# Patient Record
Sex: Female | Born: 1970 | Race: White | Hispanic: No | Smoking: Never smoker
Health system: Southern US, Community
[De-identification: ages and names within clinical notes are randomized; demographics above are authoritative.]

## PROBLEM LIST (undated history)

## (undated) DIAGNOSIS — Z8632 Personal history of gestational diabetes: Secondary | ICD-10-CM

## (undated) DIAGNOSIS — F419 Anxiety disorder, unspecified: Secondary | ICD-10-CM

## (undated) DIAGNOSIS — Z973 Presence of spectacles and contact lenses: Secondary | ICD-10-CM

## (undated) DIAGNOSIS — N870 Mild cervical dysplasia: Secondary | ICD-10-CM

## (undated) DIAGNOSIS — Z8249 Family history of ischemic heart disease and other diseases of the circulatory system: Secondary | ICD-10-CM

## (undated) DIAGNOSIS — F32A Depression, unspecified: Secondary | ICD-10-CM

## (undated) DIAGNOSIS — I10 Essential (primary) hypertension: Secondary | ICD-10-CM

## (undated) DIAGNOSIS — D649 Anemia, unspecified: Secondary | ICD-10-CM

## (undated) DIAGNOSIS — R519 Headache, unspecified: Secondary | ICD-10-CM

## (undated) DIAGNOSIS — F329 Major depressive disorder, single episode, unspecified: Secondary | ICD-10-CM

## (undated) DIAGNOSIS — K219 Gastro-esophageal reflux disease without esophagitis: Secondary | ICD-10-CM

## (undated) DIAGNOSIS — H9313 Tinnitus, bilateral: Secondary | ICD-10-CM

## (undated) HISTORY — DX: Family history of ischemic heart disease and other diseases of the circulatory system: Z82.49

## (undated) HISTORY — DX: Mild cervical dysplasia: N87.0

## (undated) HISTORY — PX: OTHER SURGICAL HISTORY: SHX169

## (undated) HISTORY — DX: Depression, unspecified: F32.A

## (undated) HISTORY — PX: BUNIONECTOMY: SHX129

## (undated) HISTORY — DX: Personal history of gestational diabetes: Z86.32

## (undated) HISTORY — DX: Anxiety disorder, unspecified: F41.9

## (undated) HISTORY — DX: Anemia, unspecified: D64.9

## (undated) HISTORY — DX: Major depressive disorder, single episode, unspecified: F32.9

---
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
## 1995-09-19 HISTORY — PX: BUNIONECTOMY: SHX129

## 1997-09-18 HISTORY — PX: CERVICAL BIOPSY  W/ LOOP ELECTRODE EXCISION: SUR135

## 1998-02-23 ENCOUNTER — Inpatient Hospital Stay (HOSPITAL_COMMUNITY): Admission: AD | Admit: 1998-02-23 | Discharge: 1998-02-23 | Payer: Self-pay | Admitting: Obstetrics and Gynecology

## 1998-02-23 ENCOUNTER — Other Ambulatory Visit: Admission: RE | Admit: 1998-02-23 | Discharge: 1998-02-23 | Payer: Self-pay | Admitting: Obstetrics and Gynecology

## 1998-03-31 ENCOUNTER — Inpatient Hospital Stay (HOSPITAL_COMMUNITY): Admission: AD | Admit: 1998-03-31 | Discharge: 1998-04-03 | Payer: Self-pay | Admitting: Gynecology

## 1998-05-13 ENCOUNTER — Other Ambulatory Visit: Admission: RE | Admit: 1998-05-13 | Discharge: 1998-05-13 | Payer: Self-pay | Admitting: Gynecology

## 1998-06-24 ENCOUNTER — Other Ambulatory Visit: Admission: RE | Admit: 1998-06-24 | Discharge: 1998-06-24 | Payer: Self-pay | Admitting: Obstetrics and Gynecology

## 1998-06-24 ENCOUNTER — Other Ambulatory Visit: Admission: RE | Admit: 1998-06-24 | Discharge: 1998-06-24 | Payer: Self-pay | Admitting: Gynecology

## 1998-10-15 ENCOUNTER — Other Ambulatory Visit: Admission: RE | Admit: 1998-10-15 | Discharge: 1998-10-15 | Payer: Self-pay | Admitting: Obstetrics and Gynecology

## 1999-06-22 ENCOUNTER — Other Ambulatory Visit: Admission: RE | Admit: 1999-06-22 | Discharge: 1999-06-22 | Payer: Self-pay | Admitting: Obstetrics and Gynecology

## 1999-11-21 ENCOUNTER — Other Ambulatory Visit: Admission: RE | Admit: 1999-11-21 | Discharge: 1999-11-21 | Payer: Self-pay | Admitting: Obstetrics and Gynecology

## 2000-02-23 ENCOUNTER — Other Ambulatory Visit: Admission: RE | Admit: 2000-02-23 | Discharge: 2000-02-23 | Payer: Self-pay | Admitting: Obstetrics and Gynecology

## 2000-06-25 ENCOUNTER — Other Ambulatory Visit: Admission: RE | Admit: 2000-06-25 | Discharge: 2000-06-25 | Payer: Self-pay | Admitting: Obstetrics and Gynecology

## 2000-07-13 ENCOUNTER — Encounter (INDEPENDENT_AMBULATORY_CARE_PROVIDER_SITE_OTHER): Payer: Self-pay

## 2000-07-13 ENCOUNTER — Other Ambulatory Visit: Admission: RE | Admit: 2000-07-13 | Discharge: 2000-07-13 | Payer: Self-pay | Admitting: Obstetrics and Gynecology

## 2000-08-03 ENCOUNTER — Encounter (INDEPENDENT_AMBULATORY_CARE_PROVIDER_SITE_OTHER): Payer: Self-pay

## 2000-08-03 ENCOUNTER — Other Ambulatory Visit: Admission: RE | Admit: 2000-08-03 | Discharge: 2000-08-03 | Payer: Self-pay | Admitting: Obstetrics and Gynecology

## 2000-11-14 ENCOUNTER — Other Ambulatory Visit: Admission: RE | Admit: 2000-11-14 | Discharge: 2000-11-14 | Payer: Self-pay | Admitting: Obstetrics and Gynecology

## 2001-02-25 ENCOUNTER — Other Ambulatory Visit: Admission: RE | Admit: 2001-02-25 | Discharge: 2001-02-25 | Payer: Self-pay | Admitting: Obstetrics and Gynecology

## 2001-05-27 ENCOUNTER — Other Ambulatory Visit: Admission: RE | Admit: 2001-05-27 | Discharge: 2001-05-27 | Payer: Self-pay | Admitting: Obstetrics and Gynecology

## 2002-06-25 ENCOUNTER — Other Ambulatory Visit: Admission: RE | Admit: 2002-06-25 | Discharge: 2002-06-25 | Payer: Self-pay | Admitting: Obstetrics and Gynecology

## 2003-07-14 ENCOUNTER — Other Ambulatory Visit: Admission: RE | Admit: 2003-07-14 | Discharge: 2003-07-14 | Payer: Self-pay | Admitting: Obstetrics and Gynecology

## 2003-09-19 HISTORY — PX: AUGMENTATION MAMMAPLASTY: SUR837

## 2004-08-10 ENCOUNTER — Other Ambulatory Visit: Admission: RE | Admit: 2004-08-10 | Discharge: 2004-08-10 | Payer: Self-pay | Admitting: Obstetrics and Gynecology

## 2005-10-06 ENCOUNTER — Other Ambulatory Visit: Admission: RE | Admit: 2005-10-06 | Discharge: 2005-10-06 | Payer: Self-pay | Admitting: Gynecology

## 2006-01-23 ENCOUNTER — Encounter: Admission: RE | Admit: 2006-01-23 | Discharge: 2006-01-23 | Payer: Self-pay | Admitting: Gynecology

## 2006-04-04 ENCOUNTER — Inpatient Hospital Stay (HOSPITAL_COMMUNITY): Admission: AD | Admit: 2006-04-04 | Discharge: 2006-04-05 | Payer: Self-pay | Admitting: Gynecology

## 2006-04-09 ENCOUNTER — Inpatient Hospital Stay (HOSPITAL_COMMUNITY): Admission: AD | Admit: 2006-04-09 | Discharge: 2006-04-12 | Payer: Self-pay | Admitting: Gynecology

## 2006-05-22 ENCOUNTER — Other Ambulatory Visit: Admission: RE | Admit: 2006-05-22 | Discharge: 2006-05-22 | Payer: Self-pay | Admitting: Gynecology

## 2007-07-19 ENCOUNTER — Other Ambulatory Visit: Admission: RE | Admit: 2007-07-19 | Discharge: 2007-07-19 | Payer: Self-pay | Admitting: Gynecology

## 2008-08-10 ENCOUNTER — Encounter: Payer: Self-pay | Admitting: Women's Health

## 2008-08-10 ENCOUNTER — Ambulatory Visit: Payer: Self-pay | Admitting: Women's Health

## 2008-08-10 ENCOUNTER — Other Ambulatory Visit: Admission: RE | Admit: 2008-08-10 | Discharge: 2008-08-10 | Payer: Self-pay | Admitting: Obstetrics and Gynecology

## 2009-08-30 ENCOUNTER — Other Ambulatory Visit: Admission: RE | Admit: 2009-08-30 | Discharge: 2009-08-30 | Payer: Self-pay | Admitting: Gynecology

## 2009-08-30 ENCOUNTER — Ambulatory Visit: Payer: Self-pay | Admitting: Women's Health

## 2010-08-31 ENCOUNTER — Other Ambulatory Visit
Admission: RE | Admit: 2010-08-31 | Discharge: 2010-08-31 | Payer: Self-pay | Source: Home / Self Care | Admitting: Gynecology

## 2010-08-31 ENCOUNTER — Ambulatory Visit: Payer: Self-pay | Admitting: Women's Health

## 2010-10-18 ENCOUNTER — Encounter
Admission: RE | Admit: 2010-10-18 | Discharge: 2010-10-18 | Payer: Self-pay | Source: Home / Self Care | Attending: Gynecology | Admitting: Gynecology

## 2011-02-03 NOTE — H&P (Signed)
NAMEMODESTA, [name]               ACCOUNT NO.:  [id number]   MEDICAL RECORD NO.:  [id number]          PATIENT TYPE:  INP   LOCATION:  9170                          FACILITY:  [facility]   PHYSICIAN:  Juan H. Lily Peer, M.D.DATE OF BIRTH:  12/22/1970   DATE OF ADMISSION:  04/04/2006  DATE OF DISCHARGE:  04/05/2006                                HISTORY & PHYSICAL   The patient is a 40 year old gravida 3, para 1, AB 1, who was seen this  morning in the office complaining of lower abdominal discomfort after a  pelvic examination determined that she was 3 cm, 80% effaced, 0 station.  She returned back in the afternoon complaining of passing of a blood clot  and bleeding.  An ultrasound had demonstrated that the placenta was intact,  AFI was normal, no previa was seen with color flow Doppler and the fetus was  reported to be in the vertex presentation.   PRENATAL HISTORY:  Significant for the fact that the patient was diagnosed  with gestational diabetes and required insulin.  Her last insulin regimen  has consisted of 8 units of NPH at bedtime and she had been followed with  twice a week NSTs and once a week AFI with reassuring antepartum testing and  her group B strep had been negative.  Due her advanced maternal age, she had  been offered genetic amniocentesis and did not do so based on the first  trimester screen findings making her risk low, nevertheless it had been  offered to the patient.  A cystic fibrosis screen had been done and it was  reported to be negative for the 32 mutations tested.   PAST MEDICAL HISTORY:  1.  The patient had a normal spontaneous vaginal delivery at 37 weeks in      1999.  2.  In 2006, she had a missed AB.  3.  SHE DENIES ANY ALLERGIES.  4.  She has had history of dysplasia and a CNB in 1999.   REVIEW OF SYSTEMS:  See [place] form.   PHYSICAL EXAMINATION:  A well-developed, well-nourished female with the  above-mentioned complaint.  Blood pressure was  112/70.  Urine was negative  for protein and glucose.  HEENT:  Unremarkable.  NECK:  Supple.  Trachea midline.  No carotid bruits, no thyromegaly.  LUNGS:  Clear to auscultation without rhonchi or wheezes.  HEART:  Regular rate and rhythm, no murmurs or gallop.  BREAST EXAM:  Not done.  ABDOMEN:  Gravid uterus.  Vertex presentation  by [facility] maneuver.  PELVIC EXAM:  Cervix 3 cm, 80% effaced, 0 station.  EXTREMITIES:  DTRs 1-2+, negative clonus, trace edema.   PRENATAL LABS:  A positive blood type, negative antibody screen, VDRL was  nonreactive, Rubella immune.  Hepatitis B Surface Antigen and HIV were  negative. Alpha fetoprotein was normal.  Cystic fibrosis screening was  normal.  GBS culture was negative, abnormal diabetes screen.   ASSESSMENT:  A 40 year old gravida 3, para 1, AB 1 at [redacted] weeks gestation,  gestational diabetic on insulin consisting of eight units of NPH every hour  of sleep with early labor.  She had been placed on a monitor, has been  contracting every 6-10 minutes apart but very irregular and mild, reassuring  tracing.  She had passed a blood clot after an examination earlier in the  morning.  Cervix was 3 and 80% and 0 station.  Ultrasound with no placental  abnormality.  Fetus in the vertex presentation.  Patient will be [facility] to  [facility], anticipate vaginal delivery.   PLAN:  As per assessment above.      Juan H. Lily Peer, M.D.  Electronically Signed     JHF/MEDQ  D:  04/09/2006  T:  04/09/2006  Job:  [id number]

## 2011-10-06 ENCOUNTER — Other Ambulatory Visit: Payer: Self-pay | Admitting: Women's Health

## 2011-11-06 ENCOUNTER — Other Ambulatory Visit: Payer: Self-pay | Admitting: Women's Health

## 2011-11-06 NOTE — Telephone Encounter (Signed)
PATIENT HAS CE SCHEDULED 11/16/11. OVERDUE FOR CE.

## 2011-11-13 DIAGNOSIS — Z8632 Personal history of gestational diabetes: Secondary | ICD-10-CM | POA: Insufficient documentation

## 2011-11-13 DIAGNOSIS — N870 Mild cervical dysplasia: Secondary | ICD-10-CM | POA: Insufficient documentation

## 2011-11-16 ENCOUNTER — Encounter: Payer: Self-pay | Admitting: Women's Health

## 2011-11-16 ENCOUNTER — Other Ambulatory Visit (HOSPITAL_COMMUNITY)
Admission: RE | Admit: 2011-11-16 | Discharge: 2011-11-16 | Disposition: A | Discharge status: Home / Self Care | Payer: 59 | Source: Ambulatory Visit | Attending: Obstetrics and Gynecology | Admitting: Obstetrics and Gynecology

## 2011-11-16 ENCOUNTER — Ambulatory Visit (INDEPENDENT_AMBULATORY_CARE_PROVIDER_SITE_OTHER): Payer: PRIVATE HEALTH INSURANCE | Admitting: Women's Health

## 2011-11-16 VITALS — BP 114/74 | Ht 63.25 in | Wt 131.0 lb

## 2011-11-16 DIAGNOSIS — Z833 Family history of diabetes mellitus: Secondary | ICD-10-CM

## 2011-11-16 DIAGNOSIS — F411 Generalized anxiety disorder: Secondary | ICD-10-CM

## 2011-11-16 DIAGNOSIS — IMO0001 Reserved for inherently not codable concepts without codable children: Secondary | ICD-10-CM

## 2011-11-16 DIAGNOSIS — Z01419 Encounter for gynecological examination (general) (routine) without abnormal findings: Secondary | ICD-10-CM | POA: Insufficient documentation

## 2011-11-16 DIAGNOSIS — F419 Anxiety disorder, unspecified: Secondary | ICD-10-CM

## 2011-11-16 DIAGNOSIS — Z309 Encounter for contraceptive management, unspecified: Secondary | ICD-10-CM

## 2011-11-16 LAB — GLUCOSE, RANDOM: Glucose, Bld: 79 mg/dL (ref 70–99)

## 2011-11-16 LAB — CBC WITH DIFFERENTIAL/PLATELET
Eosinophils Absolute: 0.4 10*3/uL (ref 0.0–0.7)
Hemoglobin: 11.8 g/dL — ABNORMAL LOW (ref 12.0–15.0)
Lymphocytes Relative: 37 % (ref 12–46)
Lymphs Abs: 1.9 10*3/uL (ref 0.7–4.0)
MCH: 29.5 pg (ref 26.0–34.0)
Monocytes Relative: 10 % (ref 3–12)
Neutrophils Relative %: 44 % (ref 43–77)
RBC: 4 MIL/uL (ref 3.87–5.11)

## 2011-11-16 MED ORDER — ESCITALOPRAM OXALATE 20 MG PO TABS: 20.0000 mg | ORAL_TABLET | Freq: Every day | ORAL | Status: DC

## 2011-11-16 MED ORDER — NORGESTIM-ETH ESTRAD TRIPHASIC 0.18/0.215/0.25 MG-35 MCG PO TABS: 1.0000 | ORAL_TABLET | Freq: Every day | ORAL | Status: DC

## 2011-11-16 NOTE — Progress Notes (Signed)
[name] Feb 18, 1971 [id number]    History:    The patient presents for annual exam.  Monthly 6-7 day cycle on Ortho Tri-Cyclen without complaint. 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 Normal mammogram. Anxiety/depression stable on Lexapro.   Past medical history, past surgical history, family history and social history were all reviewed and documented in the EPIC chart. [name] 11, [name] 3, both doing well. History of GDM   ROS:  A  ROS was performed and pertinent positives and negatives are included in the history.  Exam:  Filed Vitals:   11/16/11 0901  BP: 114/74    General appearance:  Normal Head/Neck:  Normal, without cervical or supraclavicular adenopathy. Thyroid:  Symmetrical, normal in size, without palpable masses or nodularity. Respiratory  Effort:  Normal  Auscultation:  Clear without wheezing or rhonchi Cardiovascular  Auscultation:  Regular rate, without rubs, murmurs or gallops  Edema/varicosities:  Not grossly evident Abdominal  Soft,nontender, without masses, guarding or rebound.  Liver/spleen:  No organomegaly noted  Hernia:  None appreciated  Skin  Inspection:  Grossly normal  Palpation:  Grossly normal Neurologic/psychiatric  Orientation:  Normal with appropriate conversation.  Mood/affect:  Normal  Genitourinary    Breasts: Examined lying and sitting/augmented    Right: Without masses, retractions, discharge or axillary adenopathy.     Left: Without masses, retractions, discharge or axillary adenopathy.   Inguinal/mons:  Normal without inguinal adenopathy  External genitalia:  Normal  BUS/Urethra/Skene's glands:  Normal  Bladder:  Normal  Vagina:  Normal  Cervix:  Normal  Uterus:   normal in size, shape and contour.  Midline and mobile  Adnexa/parametria:     Rt: Without masses or tenderness.   Lt: Without masses or tenderness.  Anus and perineum: Normal  Digital rectal exam: Normal sphincter tone without palpated  masses or tenderness  Assessment/Plan:  41 y.o. MWF G3 P2 for annual exam without complaint.    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 Nonsmoker. SBE's, annual mammogram, which is due, exercise, calcium rich diet, vitamin D 1000 daily encouraged. Lexapro 20 mg by mouth daily, prescription, proper use, given and reviewed declines need for counseling at this time. CBC, glucose, UA and Pap.    Harrington Challenger [facility]NP, 12:12 PM 11/16/2011

## 2011-11-16 NOTE — Patient Instructions (Signed)

## 2011-11-17 LAB — URINALYSIS W MICROSCOPIC + REFLEX CULTURE
Casts: NONE SEEN
Crystals: NONE SEEN
Leukocytes, UA: NEGATIVE
Nitrite: NEGATIVE
Specific Gravity, Urine: 1.02 (ref 1.005–1.030)
pH: 7 (ref 5.0–8.0)

## 2011-11-18 LAB — URINE CULTURE

## 2011-12-06 ENCOUNTER — Other Ambulatory Visit: Payer: Self-pay | Admitting: Women's Health

## 2012-06-27 DIAGNOSIS — F419 Anxiety disorder, unspecified: Secondary | ICD-10-CM | POA: Insufficient documentation

## 2012-11-21 ENCOUNTER — Encounter: Payer: Self-pay | Admitting: Women's Health

## 2012-11-21 ENCOUNTER — Ambulatory Visit (INDEPENDENT_AMBULATORY_CARE_PROVIDER_SITE_OTHER): Payer: PRIVATE HEALTH INSURANCE | Admitting: Women's Health

## 2012-11-21 VITALS — BP 116/70 | Ht 63.0 in | Wt 137.0 lb

## 2012-11-21 DIAGNOSIS — Z01419 Encounter for gynecological examination (general) (routine) without abnormal findings: Secondary | ICD-10-CM

## 2012-11-21 DIAGNOSIS — IMO0001 Reserved for inherently not codable concepts without codable children: Secondary | ICD-10-CM

## 2012-11-21 DIAGNOSIS — N870 Mild cervical dysplasia: Secondary | ICD-10-CM

## 2012-11-21 DIAGNOSIS — Z309 Encounter for contraceptive management, unspecified: Secondary | ICD-10-CM

## 2012-11-21 MED ORDER — NORGESTIM-ETH ESTRAD TRIPHASIC 0.18/0.215/0.25 MG-35 MCG PO TABS: 1.0000 | ORAL_TABLET | Freq: Every day | ORAL | Status: DC

## 2012-11-21 NOTE — Patient Instructions (Addendum)

## 2012-11-21 NOTE — Progress Notes (Signed)
[name] [birth year] [id number]    History:    The patient presents for annual exam.  Regular monthly cycle on Ortho Tri-Cyclen without complaint. History of CIN-1 with a LEEP in 1999 with normal Paps following. History of anxiety and depression on Lexapro in the past now on Zoloft per primary care doing well. Normal mammograms, overdue.   Past medical history, past surgical history, family history and social history were all reviewed and documented in the EPIC chart. 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 History of GDM.   ROS:  A  ROS was performed and pertinent positives and negatives are included in the history.  Exam:  Filed Vitals:   11/21/12 0938  BP: 116/70    General appearance:  Normal Head/Neck:  Normal, without cervical or supraclavicular adenopathy. Thyroid:  Symmetrical, normal in size, without palpable masses or nodularity. Respiratory  Effort:  Normal  Auscultation:  Clear without wheezing or rhonchi Cardiovascular  Auscultation:  Regular rate, without rubs, murmurs or gallops  Edema/varicosities:  Not grossly evident Abdominal  Soft,nontender, without masses, guarding or rebound.  Liver/spleen:  No organomegaly noted  Hernia:  None appreciated  Skin  Inspection:  Grossly normal  Palpation:  Grossly normal Neurologic/psychiatric  Orientation:  Normal with appropriate conversation.  Mood/affect:  Normal  Genitourinary    Breasts: Examined lying and sitting/implants.     Right: Without masses, retractions, discharge or axillary adenopathy.     Left: Without masses, retractions, discharge or axillary adenopathy.   Inguinal/mons:  Normal without inguinal adenopathy  External genitalia:  Normal  BUS/Urethra/Skene's glands:  Normal  Bladder:  Normal  Vagina:  Normal  Cervix:  Normal  Uterus:   normal in size, shape and contour.  Midline and mobile  Adnexa/parametria:     Rt: Without masses or tenderness.   Lt: Without masses or  tenderness.  Anus and perineum: Normal  Digital rectal exam: Normal sphincter tone without palpated masses or tenderness  Assessment/Plan:  42 y.o. M. WF G3 P2 for annual exam with no complaints.  Normal GYN exam on Ortho Tri-Cyclen Anxiety/depression-stable on Zoloft per primary care  Plan: Ortho Tri-Cyclen prescription, proper use, slight risk for blood clots and strokes reviewed. SBE's, instructed to schedule annual mammogram, calcium rich diet, vitamin D 1000 daily encouraged. Reviewed importance of decreasing calories for weight loss has had weight gain. Labs at primary care. Pap normal to/2013, new screening guidelines reviewed.      Harrington Challenger [facility]NP, 1:24 PM 11/21/2012

## 2012-12-17 DIAGNOSIS — J309 Allergic rhinitis, unspecified: Secondary | ICD-10-CM | POA: Insufficient documentation

## 2013-01-21 ENCOUNTER — Other Ambulatory Visit: Payer: Self-pay

## 2013-01-25 ENCOUNTER — Other Ambulatory Visit: Payer: Self-pay | Admitting: Women's Health

## 2013-01-30 ENCOUNTER — Encounter: Payer: Self-pay | Admitting: Gynecology

## 2013-02-07 ENCOUNTER — Encounter: Payer: Self-pay | Admitting: Gynecology

## 2014-01-13 ENCOUNTER — Ambulatory Visit (INDEPENDENT_AMBULATORY_CARE_PROVIDER_SITE_OTHER): Payer: PRIVATE HEALTH INSURANCE | Admitting: Women's Health

## 2014-01-13 ENCOUNTER — Other Ambulatory Visit (HOSPITAL_COMMUNITY)
Admission: RE | Admit: 2014-01-13 | Discharge: 2014-01-13 | Disposition: A | Discharge status: Home / Self Care | Payer: PRIVATE HEALTH INSURANCE | Source: Ambulatory Visit | Attending: Gynecology | Admitting: Gynecology

## 2014-01-13 ENCOUNTER — Encounter: Payer: Self-pay | Admitting: Women's Health

## 2014-01-13 VITALS — BP 116/72 | Ht 63.0 in | Wt 129.6 lb

## 2014-01-13 DIAGNOSIS — Z833 Family history of diabetes mellitus: Secondary | ICD-10-CM

## 2014-01-13 DIAGNOSIS — IMO0001 Reserved for inherently not codable concepts without codable children: Secondary | ICD-10-CM

## 2014-01-13 DIAGNOSIS — Z113 Encounter for screening for infections with a predominantly sexual mode of transmission: Secondary | ICD-10-CM

## 2014-01-13 DIAGNOSIS — Z309 Encounter for contraceptive management, unspecified: Secondary | ICD-10-CM

## 2014-01-13 DIAGNOSIS — Z13220 Encounter for screening for lipoid disorders: Secondary | ICD-10-CM

## 2014-01-13 DIAGNOSIS — Z01419 Encounter for gynecological examination (general) (routine) without abnormal findings: Secondary | ICD-10-CM

## 2014-01-13 DIAGNOSIS — F411 Generalized anxiety disorder: Secondary | ICD-10-CM

## 2014-01-13 LAB — CBC WITH DIFFERENTIAL/PLATELET
BASOS PCT: 0 % (ref 0–1)
Basophils Absolute: 0 10*3/uL (ref 0.0–0.1)
EOS ABS: 0.1 10*3/uL (ref 0.0–0.7)
EOS PCT: 2 % (ref 0–5)
HCT: 37.2 % (ref 36.0–46.0)
Hemoglobin: 12.3 g/dL (ref 12.0–15.0)
LYMPHS ABS: 1.7 10*3/uL (ref 0.7–4.0)
Lymphocytes Relative: 28 % (ref 12–46)
MCH: 30.4 pg (ref 26.0–34.0)
MCHC: 33.1 g/dL (ref 30.0–36.0)
MCV: 91.9 fL (ref 78.0–100.0)
Monocytes Absolute: 0.4 10*3/uL (ref 0.1–1.0)
Monocytes Relative: 7 % (ref 3–12)
Neutro Abs: 3.7 10*3/uL (ref 1.7–7.7)
Neutrophils Relative %: 63 % (ref 43–77)
PLATELETS: 284 10*3/uL (ref 150–400)
RBC: 4.05 MIL/uL (ref 3.87–5.11)
RDW: 14.6 % (ref 11.5–15.5)
WBC: 5.9 10*3/uL (ref 4.0–10.5)

## 2014-01-13 MED ORDER — NORETHIN ACE-ETH ESTRAD-FE 1-20 MG-MCG PO TABS: 1.0000 | ORAL_TABLET | Freq: Every day | ORAL | Status: DC

## 2014-01-13 MED ORDER — ALPRAZOLAM 0.25 MG PO TABS: 0.2500 mg | ORAL_TABLET | Freq: Every evening | ORAL | Status: DC | PRN

## 2014-01-13 NOTE — Patient Instructions (Signed)

## 2014-01-13 NOTE — Progress Notes (Signed)
Veronica ReelLaura W Garner May 02, 1971 [id number]    History:    Presents for annual exam.  Monthly cycle on Ortho Tri-Cyclen without complaint. 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 History of anxiety and depression on Zoloft per primary care is no longer taking. History of gestational diabetes. Separated from husband this past year/new partner.  Past medical history, past surgical history, family history and social history were all reviewed and documented in the EPIC chart. 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  ROS:  A  ROS was performed and pertinent positives and negatives are included.  Exam:  Filed Vitals:   01/13/14 0952  BP: 116/72    General appearance:  Normal Thyroid:  Symmetrical, normal in size, without palpable masses or nodularity. Respiratory  Auscultation:  Clear without wheezing or rhonchi Cardiovascular  Auscultation:  Regular rate, without rubs, murmurs or gallops  Edema/varicosities:  Not grossly evident Abdominal  Soft,nontender, without masses, guarding or rebound.  Liver/spleen:  No organomegaly noted  Hernia:  None appreciated  Skin  Inspection:  Grossly normal   Breasts: Examined lying and sitting/implants    Right: Without masses, retractions, discharge or axillary adenopathy.     Left: Without masses, retractions, discharge or axillary adenopathy. Gentitourinary   Inguinal/mons:  Normal without inguinal adenopathy  External genitalia:  Normal  BUS/Urethra/Skene's glands:  Normal  Vagina:  Normal  Cervix:  Normal  Uterus:   normal in size, shape and contour.  Midline and mobile  Adnexa/parametria:     Rt: Without masses or tenderness.   Lt: Without masses or tenderness.  Anus and perineum: Normal  Digital rectal exam: Normal sphincter tone without palpated masses or tenderness  Assessment/Plan:  43 y.o. DWF G2P2 for annual exam.     Situational anxiety Gestational get diabetes 1999 LEEP CIN-1 normal Paps after STD screen Ortho  Tri-Cyclen  Plan: Contraception reviewed will try Loestrin 1/20 prescription, proper use given and reviewed risk for blood clots and strokes. Condoms encouraged until permanent partner. SBE's, continue annual mammogram, reviewed [facility]  [facility]. CBC, glucose, lipid panel, UA, Pap with high-risk HPV typing. GC/Chlamydia, HIV, hep B, C., RPR. Xanax 0.25 prescription, proper use, addictive properties reviewed. Continue counseling.   [name] [facility]NP, 2:12 PM 01/13/2014

## 2014-01-14 LAB — URINALYSIS W MICROSCOPIC + REFLEX CULTURE
Bacteria, UA: NONE SEEN
Bilirubin Urine: NEGATIVE
Casts: NONE SEEN
Crystals: NONE SEEN
Glucose, UA: NEGATIVE mg/dL
Ketones, ur: NEGATIVE mg/dL
Leukocytes, UA: NEGATIVE
Nitrite: NEGATIVE
Protein, ur: NEGATIVE mg/dL
Specific Gravity, Urine: 1.021 (ref 1.005–1.030)
Squamous Epithelial / HPF: NONE SEEN
Urobilinogen, UA: 0.2 mg/dL (ref 0.0–1.0)
pH: 6 (ref 5.0–8.0)

## 2014-01-14 LAB — GLUCOSE, RANDOM: Glucose, Bld: 92 mg/dL (ref 70–99)

## 2014-01-14 LAB — HEPATITIS C ANTIBODY: HCV Ab: NEGATIVE

## 2014-01-14 LAB — RPR

## 2014-01-14 LAB — LIPID PANEL
Cholesterol: 154 mg/dL (ref 0–200)
HDL: 54 mg/dL (ref >39)
LDL CALC: 75 mg/dL (ref 0–99)
Total CHOL/HDL Ratio: 2.9 Ratio
Triglycerides: 125 mg/dL (ref <150)
VLDL: 25 mg/dL (ref 0–40)

## 2014-01-14 LAB — HIV ANTIBODY (ROUTINE TESTING W REFLEX): HIV 1&2 Ab, 4th Generation: NONREACTIVE

## 2014-01-14 LAB — GC/CHLAMYDIA PROBE AMP
CT Probe RNA: NEGATIVE
GC Probe RNA: NEGATIVE

## 2014-01-14 LAB — HEPATITIS B SURFACE ANTIGEN: Hepatitis B Surface Ag: NEGATIVE

## 2014-05-12 ENCOUNTER — Encounter: Payer: Self-pay | Admitting: Women's Health

## 2014-05-12 ENCOUNTER — Ambulatory Visit (INDEPENDENT_AMBULATORY_CARE_PROVIDER_SITE_OTHER): Payer: PRIVATE HEALTH INSURANCE | Admitting: Women's Health

## 2014-05-12 DIAGNOSIS — B3731 Acute candidiasis of vulva and vagina: Secondary | ICD-10-CM

## 2014-05-12 DIAGNOSIS — B373 Candidiasis of vulva and vagina: Secondary | ICD-10-CM

## 2014-05-12 DIAGNOSIS — Z3049 Encounter for surveillance of other contraceptives: Secondary | ICD-10-CM

## 2014-05-12 DIAGNOSIS — N898 Other specified noninflammatory disorders of vagina: Secondary | ICD-10-CM

## 2014-05-12 LAB — WET PREP FOR TRICH, YEAST, CLUE
Clue Cells Wet Prep HPF POC: NONE SEEN
Trich, Wet Prep: NONE SEEN

## 2014-05-12 MED ORDER — NORGESTREL-ETHINYL ESTRADIOL 0.3-30 MG-MCG PO TABS: 1.0000 | ORAL_TABLET | Freq: Every day | ORAL | Status: DC

## 2014-05-12 MED ORDER — FLUCONAZOLE 150 MG PO TABS: 150.0000 mg | ORAL_TABLET | Freq: Once | ORAL | Status: DC

## 2014-05-12 NOTE — Patient Instructions (Signed)

## 2014-05-12 NOTE — Progress Notes (Signed)
Patient ID: [name], female   DOB: June 03, 1971, 43 y.o.   MRN: [id number] Presents with complaint of spotting with intercourse. Negative STD screen with partner. Has been under increased stress due to impending divorce. Had been on Ortho Tri-Cyclen and changed to Loestrin 1/20, age 47 at annual 12/2013. Spotting started after the change in pills. Reports minimal discharge, increased urinary urgency without pain or burning.  Exam: Appears well. External genitalia within normal limits, speculum exam no visible lesion, blood, scant discharge, wet prep positive for yeast. Bimanual no CMT or adnexal fullness or tenderness.  Yeast vaginitis Spotting with intercourse  Plan: Options reviewed, Loovral prescription, proper use given and reviewed slight risk for blood clots and strokes, finish out current pack, then start. Instructed to call or return if continued spotting. Diflucan  one dose. Counseling encouraged for impending divorce.

## 2014-07-20 ENCOUNTER — Encounter: Payer: Self-pay | Admitting: Women's Health

## 2014-10-23 ENCOUNTER — Encounter: Payer: Self-pay | Admitting: Gynecology

## 2015-01-05 ENCOUNTER — Telehealth: Payer: Self-pay | Admitting: *Deleted

## 2015-01-05 DIAGNOSIS — F411 Generalized anxiety disorder: Secondary | ICD-10-CM

## 2015-01-05 NOTE — Telephone Encounter (Signed)
Okay for refill Xanax 0.25 daily when necessary #30 with 1 refill.

## 2015-01-05 NOTE — Telephone Encounter (Signed)
Pt requesting refill on xanax 0.25 mg, annual scheduled on 01/21/15.

## 2015-01-06 MED ORDER — ALPRAZOLAM 0.25 MG PO TABS: 0.2500 mg | ORAL_TABLET | Freq: Every evening | ORAL | Status: DC | PRN

## 2015-01-06 NOTE — Telephone Encounter (Signed)
Rx called in 

## 2015-01-21 ENCOUNTER — Ambulatory Visit (INDEPENDENT_AMBULATORY_CARE_PROVIDER_SITE_OTHER): Payer: PRIVATE HEALTH INSURANCE | Admitting: Women's Health

## 2015-01-21 ENCOUNTER — Encounter: Payer: Self-pay | Admitting: Women's Health

## 2015-01-21 VITALS — BP 112/72 | Ht 63.0 in | Wt 131.8 lb

## 2015-01-21 DIAGNOSIS — Z01419 Encounter for gynecological examination (general) (routine) without abnormal findings: Secondary | ICD-10-CM

## 2015-01-21 DIAGNOSIS — Z833 Family history of diabetes mellitus: Secondary | ICD-10-CM

## 2015-01-21 DIAGNOSIS — Z3049 Encounter for surveillance of other contraceptives: Secondary | ICD-10-CM | POA: Diagnosis not present

## 2015-01-21 LAB — CBC WITH DIFFERENTIAL/PLATELET
BASOS ABS: 0 10*3/uL (ref 0.0–0.1)
BASOS PCT: 0 % (ref 0–1)
Eosinophils Absolute: 0.2 10*3/uL (ref 0.0–0.7)
Eosinophils Relative: 3 % (ref 0–5)
HEMATOCRIT: 35.9 % — AB (ref 36.0–46.0)
Hemoglobin: 11.9 g/dL — ABNORMAL LOW (ref 12.0–15.0)
Lymphocytes Relative: 25 % (ref 12–46)
Lymphs Abs: 1.8 10*3/uL (ref 0.7–4.0)
MCH: 31.1 pg (ref 26.0–34.0)
MCHC: 33.1 g/dL (ref 30.0–36.0)
MCV: 93.7 fL (ref 78.0–100.0)
MPV: 8.8 fL (ref 8.6–12.4)
Monocytes Absolute: 0.6 10*3/uL (ref 0.1–1.0)
Monocytes Relative: 8 % (ref 3–12)
NEUTROS ABS: 4.5 10*3/uL (ref 1.7–7.7)
NEUTROS PCT: 64 % (ref 43–77)
PLATELETS: 300 10*3/uL (ref 150–400)
RBC: 3.83 MIL/uL — ABNORMAL LOW (ref 3.87–5.11)
RDW: 14.1 % (ref 11.5–15.5)
WBC: 7.1 10*3/uL (ref 4.0–10.5)

## 2015-01-21 LAB — GLUCOSE, RANDOM: Glucose, Bld: 86 mg/dL (ref 70–99)

## 2015-01-21 MED ORDER — NORGESTREL-ETHINYL ESTRADIOL 0.3-30 MG-MCG PO TABS: 1.0000 | ORAL_TABLET | Freq: Every day | ORAL | Status: DC

## 2015-01-21 NOTE — Addendum Note (Signed)
Addended by: Aura CampsWEBB, Charmaine Placido L on: 01/21/2015 12:10 PM   Modules accepted: Orders

## 2015-01-21 NOTE — Progress Notes (Signed)
Mayra ReelLaura W Ticas [birth year] [id number]    History:    Presents for annual exam.  Light monthly cycle on lo Ovral, had spotting on Loestrin 1/20. 1999 CIN-1 with LEEP with normal Paps after. Normal mammogram. Has been separated from her husband greater than 2 years, new partner, negative STD screen.  Past medical history, past surgical history, family history and social history were all reviewed and documented in the EPIC chart. 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 Father died of lung cancer age 46, mother died of heart disease and diabetes, raised by her grandmother who recently died. History of gestational diabetes.  ROS:  A ROS was performed and pertinent positives and negatives are included.  Exam:  Filed Vitals:   01/21/15 0934  BP: 112/72    General appearance:  Normal Thyroid:  Symmetrical, normal in size, without palpable masses or nodularity. Respiratory  Auscultation:  Clear without wheezing or rhonchi Cardiovascular  Auscultation:  Regular rate, without rubs, murmurs or gallops  Edema/varicosities:  Not grossly evident Abdominal  Soft,nontender, without masses, guarding or rebound.  Liver/spleen:  No organomegaly noted  Hernia:  None appreciated  Skin  Inspection:  Grossly normal   Breasts: Examined lying and sitting/bilateral saline implants.     Right: Without masses, retractions, discharge or axillary adenopathy.     Left: Without masses, retractions, discharge or axillary adenopathy. Gentitourinary   Inguinal/mons:  Normal without inguinal adenopathy  External genitalia:  Normal  BUS/Urethra/Skene's glands:  Normal  Vagina:  Normal  Cervix:  Normal  Uterus:   normal in size, shape and contour.  Midline and mobile  Adnexa/parametria:     Rt: Without masses or tenderness.   Lt: Without masses or tenderness.  Anus and perineum: Normal  Digital rectal exam: Normal sphincter tone without palpated masses or tenderness  Assessment/Plan:  44 y.o.  separated WF G2 P2 for annual exam with no complaints.  Monthly cycle on Lo/Ovral  1999 CIN-1 with LEEP normal Paps after  Plan: Lo/Ovral prescription, proper use, slight risk for blood clots and strokes reviewed. SBE's, continue annual screening mammogram continue 3-D screening. Continue walking on a regular basis, calcium rich diet, vitamin D 1000 daily encouraged. CBC, glucose, UA, Pap normal 2015, new screening guidelines reviewed. Will lipid panel 2015.  Harrington ChallengerYOUNG,Ardelia Wrede J [facility] HospitalWHNP, 11:28 AM 01/21/2015

## 2015-01-21 NOTE — Addendum Note (Signed)
Addended by: Harrington ChallengerYOUNG, NANCY J on: 01/21/2015 02:06 PM   Modules accepted: Orders

## 2015-01-21 NOTE — Patient Instructions (Signed)

## 2015-01-22 LAB — URINALYSIS W MICROSCOPIC + REFLEX CULTURE
Bacteria, UA: NONE SEEN
Bilirubin Urine: NEGATIVE
CASTS: NONE SEEN
CRYSTALS: NONE SEEN
GLUCOSE, UA: NEGATIVE mg/dL
Hgb urine dipstick: NEGATIVE
Ketones, ur: NEGATIVE mg/dL
LEUKOCYTES UA: NEGATIVE
NITRITE: NEGATIVE
PH: 6 (ref 5.0–8.0)
Protein, ur: NEGATIVE mg/dL
SPECIFIC GRAVITY, URINE: 1.024 (ref 1.005–1.030)
Squamous Epithelial / LPF: NONE SEEN
Urobilinogen, UA: 0.2 mg/dL (ref 0.0–1.0)

## 2016-03-27 ENCOUNTER — Other Ambulatory Visit: Payer: Self-pay

## 2016-03-27 DIAGNOSIS — Z3049 Encounter for surveillance of other contraceptives: Secondary | ICD-10-CM

## 2016-03-28 ENCOUNTER — Other Ambulatory Visit: Payer: Self-pay

## 2016-04-03 ENCOUNTER — Telehealth: Payer: Self-pay | Admitting: *Deleted

## 2016-04-03 DIAGNOSIS — Z3049 Encounter for surveillance of other contraceptives: Secondary | ICD-10-CM

## 2016-04-03 MED ORDER — NORGESTREL-ETHINYL ESTRADIOL 0.3-30 MG-MCG PO TABS: 1.0000 | ORAL_TABLET | Freq: Every day | ORAL | Status: DC

## 2016-04-03 NOTE — Telephone Encounter (Signed)
Pt has annual schedule on 04/20/16 needs 1 pack sent to pharmacy of birth control pills. Rx sent.

## 2016-04-20 ENCOUNTER — Encounter: Payer: Self-pay | Admitting: Women's Health

## 2016-04-20 ENCOUNTER — Ambulatory Visit (INDEPENDENT_AMBULATORY_CARE_PROVIDER_SITE_OTHER): Payer: Managed Care, Other (non HMO) | Admitting: Women's Health

## 2016-04-20 VITALS — BP 124/80 | Ht 63.0 in | Wt 135.0 lb

## 2016-04-20 DIAGNOSIS — Z3049 Encounter for surveillance of other contraceptives: Secondary | ICD-10-CM | POA: Diagnosis not present

## 2016-04-20 DIAGNOSIS — Z1329 Encounter for screening for other suspected endocrine disorder: Secondary | ICD-10-CM | POA: Diagnosis not present

## 2016-04-20 DIAGNOSIS — F411 Generalized anxiety disorder: Secondary | ICD-10-CM | POA: Diagnosis not present

## 2016-04-20 DIAGNOSIS — Z01419 Encounter for gynecological examination (general) (routine) without abnormal findings: Secondary | ICD-10-CM | POA: Diagnosis not present

## 2016-04-20 DIAGNOSIS — Z13220 Encounter for screening for lipoid disorders: Secondary | ICD-10-CM

## 2016-04-20 LAB — COMPREHENSIVE METABOLIC PANEL
ALBUMIN: 4 g/dL (ref 3.6–5.1)
ALK PHOS: 33 U/L (ref 33–115)
ALT: 13 U/L (ref 6–29)
AST: 15 U/L (ref 10–35)
BILIRUBIN TOTAL: 0.6 mg/dL (ref 0.2–1.2)
BUN: 13 mg/dL (ref 7–25)
CO2: 24 mmol/L (ref 20–31)
CREATININE: 0.69 mg/dL (ref 0.50–1.10)
Calcium: 9 mg/dL (ref 8.6–10.2)
Chloride: 105 mmol/L (ref 98–110)
GLUCOSE: 96 mg/dL (ref 65–99)
Potassium: 4.3 mmol/L (ref 3.5–5.3)
SODIUM: 138 mmol/L (ref 135–146)
Total Protein: 6.4 g/dL (ref 6.1–8.1)

## 2016-04-20 LAB — CBC WITH DIFFERENTIAL/PLATELET
Basophils Absolute: 0 cells/uL (ref 0–200)
Basophils Relative: 0 %
EOS PCT: 5 %
Eosinophils Absolute: 230 cells/uL (ref 15–500)
HCT: 36.8 % (ref 35.0–45.0)
HEMOGLOBIN: 12.3 g/dL (ref 11.7–15.5)
LYMPHS ABS: 1840 {cells}/uL (ref 850–3900)
Lymphocytes Relative: 40 %
MCH: 31.3 pg (ref 27.0–33.0)
MCHC: 33.4 g/dL (ref 32.0–36.0)
MCV: 93.6 fL (ref 80.0–100.0)
MPV: 9 fL (ref 7.5–12.5)
Monocytes Absolute: 506 cells/uL (ref 200–950)
Monocytes Relative: 11 %
NEUTROS ABS: 2024 {cells}/uL (ref 1500–7800)
Neutrophils Relative %: 44 %
PLATELETS: 308 10*3/uL (ref 140–400)
RBC: 3.93 MIL/uL (ref 3.80–5.10)
RDW: 13.9 % (ref 11.0–15.0)
WBC: 4.6 10*3/uL (ref 3.8–10.8)

## 2016-04-20 LAB — TSH: TSH: 2.22 mIU/L

## 2016-04-20 LAB — LIPID PANEL
Cholesterol: 147 mg/dL (ref 125–200)
HDL: 43 mg/dL — ABNORMAL LOW (ref >=46)
LDL Cholesterol: 85 mg/dL (ref <130)
Total CHOL/HDL Ratio: 3.4 Ratio (ref <=5.0)
Triglycerides: 93 mg/dL (ref <150)
VLDL: 19 mg/dL (ref <30)

## 2016-04-20 MED ORDER — NORGESTREL-ETHINYL ESTRADIOL 0.3-30 MG-MCG PO TABS: 1.0000 | ORAL_TABLET | Freq: Every day | ORAL | 4 refills | Status: DC

## 2016-04-20 MED ORDER — ALPRAZOLAM 0.25 MG PO TABS: 0.2500 mg | ORAL_TABLET | Freq: Every evening | ORAL | 1 refills | Status: DC | PRN

## 2016-04-20 NOTE — Patient Instructions (Signed)
Health Maintenance, Female Adopting a healthy lifestyle and getting preventive care can go a long way to promote health and wellness. Talk with your health care provider about what schedule of regular examinations is right for you. This is a good chance for you to check in with your provider about disease prevention and staying healthy. In between checkups, there are plenty of things you can do on your own. Experts have done a lot of research about which lifestyle changes and preventive measures are most likely to keep you healthy. Ask your health care provider for more information. WEIGHT AND DIET  Eat a healthy diet  Be sure to include plenty of vegetables, fruits, low-fat dairy products, and lean protein.  Do not eat a lot of foods high in solid fats, added sugars, or salt.  Get regular exercise. This is one of the most important things you can do for your health.  Most adults should exercise for at least 150 minutes each week. The exercise should increase your heart rate and make you sweat (moderate-intensity exercise).  Most adults should also do strengthening exercises at least twice a week. This is in addition to the moderate-intensity exercise.  Maintain a healthy weight  Body mass index (BMI) is a measurement that can be used to identify possible weight problems. It estimates body fat based on height and weight. Your health care provider can help determine your BMI and help you achieve or maintain a healthy weight.  For females 20 years of age and older:   A BMI below 18.5 is considered underweight.  A BMI of 18.5 to 24.9 is normal.  A BMI of 25 to 29.9 is considered overweight.  A BMI of 30 and above is considered obese.  Watch levels of cholesterol and blood lipids  You should start having your blood tested for lipids and cholesterol at 47 years of age, then have this test every 5 years.  You may need to have your cholesterol levels checked more often if:  Your lipid  or cholesterol levels are high.  You are older than 47 years of age.  You are at high risk for heart disease.  CANCER SCREENING   Lung Cancer  Lung cancer screening is recommended for adults 55-80 years old who are at high risk for lung cancer because of a history of smoking.  A yearly low-dose CT scan of the lungs is recommended for people who:  Currently smoke.  Have quit within the past 15 years.  Have at least a 30-pack-year history of smoking. A pack year is smoking an average of one pack of cigarettes a day for 1 year.  Yearly screening should continue until it has been 15 years since you quit.  Yearly screening should stop if you develop a health problem that would prevent you from having lung cancer treatment.  Breast Cancer  Practice breast self-awareness. This means understanding how your breasts normally appear and feel.  It also means doing regular breast self-exams. Let your health care provider know about any changes, no matter how small.  If you are in your 20s or 30s, you should have a clinical breast exam (CBE) by a health care provider every 1-3 years as part of a regular health exam.  If you are 40 or older, have a CBE every year. Also consider having a breast X-ray (mammogram) every year.  If you have a family history of breast cancer, talk to your health care provider about genetic screening.  If you   are at high risk for breast cancer, talk to your health care provider about having an MRI and a mammogram every year.  Breast cancer gene (BRCA) assessment is recommended for women who have family members with BRCA-related cancers. BRCA-related cancers include:  Breast.  Ovarian.  Tubal.  Peritoneal cancers.  Results of the assessment will determine the need for genetic counseling and BRCA1 and BRCA2 testing. Cervical Cancer Your health care provider may recommend that you be screened regularly for cancer of the pelvic organs (ovaries, uterus, and  vagina). This screening involves a pelvic examination, including checking for microscopic changes to the surface of your cervix (Pap test). You may be encouraged to have this screening done every 3 years, beginning at age 21.  For women ages 30-65, health care providers may recommend pelvic exams and Pap testing every 3 years, or they may recommend the Pap and pelvic exam, combined with testing for human papilloma virus (HPV), every 5 years. Some types of HPV increase your risk of cervical cancer. Testing for HPV may also be done on women of any age with unclear Pap test results.  Other health care providers may not recommend any screening for nonpregnant women who are considered low risk for pelvic cancer and who do not have symptoms. Ask your health care provider if a screening pelvic exam is right for you.  If you have had past treatment for cervical cancer or a condition that could lead to cancer, you need Pap tests and screening for cancer for at least 20 years after your treatment. If Pap tests have been discontinued, your risk factors (such as having a new sexual partner) need to be reassessed to determine if screening should resume. Some women have medical problems that increase the chance of getting cervical cancer. In these cases, your health care provider may recommend more frequent screening and Pap tests. Colorectal Cancer  This type of cancer can be detected and often prevented.  Routine colorectal cancer screening usually begins at 47 years of age and continues through 47 years of age.  Your health care provider may recommend screening at an earlier age if you have risk factors for colon cancer.  Your health care provider may also recommend using home test kits to check for hidden blood in the stool.  A small camera at the end of a tube can be used to examine your colon directly (sigmoidoscopy or colonoscopy). This is done to check for the earliest forms of colorectal  cancer.  Routine screening usually begins at age 50.  Direct examination of the colon should be repeated every 5-10 years through 47 years of age. However, you may need to be screened more often if early forms of precancerous polyps or small growths are found. Skin Cancer  Check your skin from head to toe regularly.  Tell your health care provider about any new moles or changes in moles, especially if there is a change in a mole's shape or color.  Also tell your health care provider if you have a mole that is larger than the size of a pencil eraser.  Always use sunscreen. Apply sunscreen liberally and repeatedly throughout the day.  Protect yourself by wearing long sleeves, pants, a wide-brimmed hat, and sunglasses whenever you are outside. HEART DISEASE, DIABETES, AND HIGH BLOOD PRESSURE   High blood pressure causes heart disease and increases the risk of stroke. High blood pressure is more likely to develop in:  People who have blood pressure in the high end   of the normal range (130-139/85-89 mm Hg).  People who are overweight or obese.  People who are African American.  If you are 38-23 years of age, have your blood pressure checked every 3-5 years. If you are 61 years of age or older, have your blood pressure checked every year. You should have your blood pressure measured twice--once when you are at a hospital or clinic, and once when you are not at a hospital or clinic. Record the average of the two measurements. To check your blood pressure when you are not at a hospital or clinic, you can use:  An automated blood pressure machine at a pharmacy.  A home blood pressure monitor.  If you are between 45 years and 39 years old, ask your health care provider if you should take aspirin to prevent strokes.  Have regular diabetes screenings. This involves taking a blood sample to check your fasting blood sugar level.  If you are at a normal weight and have a low risk for diabetes,  have this test once every three years after 47 years of age.  If you are overweight and have a high risk for diabetes, consider being tested at a younger age or more often. PREVENTING INFECTION  Hepatitis B  If you have a higher risk for hepatitis B, you should be screened for this virus. You are considered at high risk for hepatitis B if:  You were born in a country where hepatitis B is common. Ask your health care provider which countries are considered high risk.  Your parents were born in a high-risk country, and you have not been immunized against hepatitis B (hepatitis B vaccine).  You have HIV or AIDS.  You use needles to inject street drugs.  You live with someone who has hepatitis B.  You have had sex with someone who has hepatitis B.  You get hemodialysis treatment.  You take certain medicines for conditions, including cancer, organ transplantation, and autoimmune conditions. Hepatitis C  Blood testing is recommended for:  Everyone born from [birth year] through 1967.  Anyone with known risk factors for hepatitis C. Sexually transmitted infections (STIs)  You should be screened for sexually transmitted infections (STIs) including gonorrhea and chlamydia if:  You are sexually active and are younger than 47 years of age.  You are older than 47 years of age and your health care provider tells you that you are at risk for this type of infection.  Your sexual activity has changed since you were last screened and you are at an increased risk for chlamydia or gonorrhea. Ask your health care provider if you are at risk.  If you do not have HIV, but are at risk, it may be recommended that you take a prescription medicine daily to prevent HIV infection. This is called pre-exposure prophylaxis (PrEP). You are considered at risk if:  You are sexually active and do not regularly use condoms or know the HIV status of your partner(s).  You take drugs by injection.  You are sexually  active with a partner who has HIV. Talk with your health care provider about whether you are at high risk of being infected with HIV. If you choose to begin PrEP, you should first be tested for HIV. You should then be tested every 3 months for as long as you are taking PrEP.  PREGNANCY   If you are premenopausal and you may become pregnant, ask your health care provider about preconception counseling.  If you may  become pregnant, take 400 to 800 micrograms (mcg) of folic acid every day.  If you want to prevent pregnancy, talk to your health care provider about birth control (contraception). OSTEOPOROSIS AND MENOPAUSE   Osteoporosis is a disease in which the bones lose minerals and strength with aging. This can result in serious bone fractures. Your risk for osteoporosis can be identified using a bone density scan.  If you are 61 years of age or older, or if you are at risk for osteoporosis and fractures, ask your health care provider if you should be screened.  Ask your health care provider whether you should take a calcium or vitamin D supplement to lower your risk for osteoporosis.  Menopause may have certain physical symptoms and risks.  Hormone replacement therapy may reduce some of these symptoms and risks. Talk to your health care provider about whether hormone replacement therapy is right for you.  HOME CARE INSTRUCTIONS   Schedule regular health, dental, and eye exams.  Stay current with your immunizations.   Do not use any tobacco products including cigarettes, chewing tobacco, or electronic cigarettes.  If you are pregnant, do not drink alcohol.  If you are breastfeeding, limit how much and how often you drink alcohol.  Limit alcohol intake to no more than 1 drink per day for nonpregnant women. One drink equals 12 ounces of beer, 5 ounces of wine, or 1 ounces of hard liquor.  Do not use street drugs.  Do not share needles.  Ask your health care provider for help if  you need support or information about quitting drugs.  Tell your health care provider if you often feel depressed.  Tell your health care provider if you have ever been abused or do not feel safe at home.   This information is not intended to replace advice given to you by your health care provider. Make sure you discuss any questions you have with your health care provider.   Document Released: 03/20/2011 Document Revised: 09/25/2014 Document Reviewed: 08/06/2013 Elsevier Interactive Patient Education [employer].

## 2016-04-20 NOTE — Progress Notes (Signed)
[name] 01-25-1971 [id number]    History:    Presents for annual exam.  Light cycle on Lo/Ovral without complaint. Same partner with negative STD screen. 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 Normal mammogram history. History of GDM. Increased stress with 47 year old son having increased seizures with epilepsy. Divorce final, ex-husband heroin addict, minimal contact with children.   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 Father died from lung cancer, mother died from heart disease and diabetes raised by her grandmother, grandmother died 2 years ago.  ROS:  A ROS was performed and pertinent positives and negatives are included.  Exam:  Vitals:   04/20/16 0814  BP: 124/80  Weight: 135 lb (61.2 kg)  Height: 5\' 3"  (1.6 m)   Body mass index is 23.91 kg/m.   General appearance:  Normal Thyroid:  Symmetrical, normal in size, without palpable masses or nodularity. Respiratory  Auscultation:  Clear without wheezing or rhonchi Cardiovascular  Auscultation:  Regular rate, without rubs, murmurs or gallops  Edema/varicosities:  Not grossly evident Abdominal  Soft,nontender, without masses, guarding or rebound.  Liver/spleen:  No organomegaly noted  Hernia:  None appreciated  Skin  Inspection:  Grossly normal   Breasts: Examined lying and sitting.     Right: Without masses, retractions, discharge or axillary adenopathy.     Left: Without masses, retractions, discharge or axillary adenopathy. Gentitourinary   Inguinal/mons:  Normal without inguinal adenopathy  External genitalia:  Normal  BUS/Urethra/Skene's glands:  Normal  Vagina:  Normal  Cervix:  Normal  Uterus: normal in size, shape and contour.  Midline and mobile  Adnexa/parametria:     Rt: Without masses or tenderness.   Lt: Without masses or tenderness.  Anus and  perineum: Normal  Digital rectal exam: Normal sphincter tone without palpated masses or tenderness  Assessment/Plan:  45 y.o. D WF G2 P2 for annual exam.     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 Has tried 20 microgram OCs but had problems. SBE's, annual screening mammogram overdue instructed to schedule. Exercise, calcium rich diet, vitamin D 1000 daily encouraged. CBC, CMP, lipid panel, TSH, UA, Pap with HR HPV typing, new screening guidelines reviewed. Prescription for Xanax 0.25 aware to use sparingly given.    Harrington Challenger [facility], 9:24 AM 04/20/2016

## 2016-04-21 LAB — URINALYSIS W MICROSCOPIC + REFLEX CULTURE
BACTERIA UA: NONE SEEN [HPF]
Bilirubin Urine: NEGATIVE
CASTS: NONE SEEN [LPF]
CRYSTALS: NONE SEEN [HPF]
Glucose, UA: NEGATIVE
HGB URINE DIPSTICK: NEGATIVE
KETONES UR: NEGATIVE
Leukocytes, UA: NEGATIVE
Nitrite: NEGATIVE
PH: 7.5 (ref 5.0–8.0)
PROTEIN: NEGATIVE
RBC / HPF: NONE SEEN RBC/HPF (ref <=2)
Specific Gravity, Urine: 1.021 (ref 1.001–1.035)
WBC, UA: NONE SEEN WBC/HPF (ref <=5)
YEAST: NONE SEEN [HPF]

## 2016-04-22 LAB — PAP, TP IMAGING W/ HPV RNA, RFLX HPV TYPE 16,18/45: HPV mRNA, High Risk: NOT DETECTED

## 2016-10-17 ENCOUNTER — Ambulatory Visit (INDEPENDENT_AMBULATORY_CARE_PROVIDER_SITE_OTHER): Payer: No Typology Code available for payment source | Admitting: Women's Health

## 2016-10-17 ENCOUNTER — Encounter: Payer: Self-pay | Admitting: Women's Health

## 2016-10-17 VITALS — BP 120/84

## 2016-10-17 DIAGNOSIS — N898 Other specified noninflammatory disorders of vagina: Secondary | ICD-10-CM

## 2016-10-17 LAB — WET PREP FOR TRICH, YEAST, CLUE
CLUE CELLS WET PREP: NONE SEEN
TRICH WET PREP: NONE SEEN
YEAST WET PREP: NONE SEEN

## 2016-10-17 NOTE — Progress Notes (Signed)
Presents with complaint of vaginal irritation, relates to possible increased perspiration from increased hot flushes. Cycles are getting lighter on OCs,  no missed cycles. Same partner. Denies vaginal itching, odor, urinary symptoms, abdominal pain or fever.  Exam: Appears well. External  genitalia mild erythema  posterior fornix extending to rectum, no visible lesions.  Speculum exam scant discharge, no erythema, wet prep negative. Bimanual no CMT or adnexal tenderness.  Vaginal irritation Perimenopausal  Plan: Reviewed possible irritation from increased perspiration, apply A and D ointment twice daily, dry clothes, open to air when able, call if continued problems. Reviewed normality of wet prep and menopause. Will continue OCPs, reviewed will help with transition to menopause.

## 2017-01-31 ENCOUNTER — Encounter: Payer: Self-pay | Admitting: Gynecology

## 2017-02-13 ENCOUNTER — Encounter: Payer: Self-pay | Admitting: Women's Health

## 2017-02-13 ENCOUNTER — Ambulatory Visit (INDEPENDENT_AMBULATORY_CARE_PROVIDER_SITE_OTHER): Payer: No Typology Code available for payment source | Admitting: Women's Health

## 2017-02-13 VITALS — BP 120/78 | Ht 63.0 in

## 2017-02-13 DIAGNOSIS — N926 Irregular menstruation, unspecified: Secondary | ICD-10-CM

## 2017-02-13 LAB — TSH: TSH: 2.07 mIU/L

## 2017-02-13 LAB — PREGNANCY, URINE: Preg Test, Ur: NEGATIVE

## 2017-02-13 MED ORDER — MEGESTROL ACETATE 40 MG PO TABS: 40.0000 mg | ORAL_TABLET | Freq: Two times a day (BID) | ORAL | 0 refills | Status: DC

## 2017-02-13 NOTE — Patient Instructions (Signed)
Dysfunctional Uterine Bleeding Dysfunctional uterine bleeding is abnormal bleeding from the uterus. Dysfunctional uterine bleeding includes:  A period that comes earlier or later than usual.  A period that is lighter, heavier, or has blood clots.  Bleeding between periods.  Skipping one or more periods.  Bleeding after sexual intercourse.  Bleeding after menopause. Follow these instructions at home: Pay attention to any changes in your symptoms. Follow these instructions to help with your condition: Eating and drinking   Eat well-balanced meals. Include foods that are high in iron, such as liver, meat, shellfish, green leafy vegetables, and eggs.  If you become constipated:  Drink plenty of water.  Eat fruits and vegetables that are high in water and fiber, such as spinach, carrots, raspberries, apples, and mango. Medicines   Take over-the-counter and prescription medicines only as told by your health care provider.  Do not change medicines without talking with your health care provider.  Aspirin or medicines that contain aspirin may make the bleeding worse. Do not take those medicines:  During the week before your period.  During your period.  If you were prescribed iron pills, take them as told by your health care provider. Iron pills help to replace iron that your body loses because of this condition. Activity   If you need to change your sanitary pad or tampon more than one time every 2 hours:  Lie in bed with your feet raised (elevated).  Place a cold pack on your lower abdomen.  Rest as much as possible until the bleeding stops or slows down.  Do not try to lose weight until the bleeding has stopped and your blood iron level is back to normal. Other Instructions   For two months, write down:  When your period starts.  When your period ends.  When any abnormal bleeding occurs.  What problems you notice.  Keep all follow up visits as told by your  health care provider. This is important. Contact a health care provider if:  You get light-headed or weak.  You have nausea and vomiting.  You cannot eat or drink without vomiting.  You feel dizzy or have diarrhea while you are taking medicines.  You are taking birth control pills or hormones, and you want to change them or stop taking them. Get help right away if:  You develop a fever or chills.  You need to change your sanitary pad or tampon more than one time per hour.  Your bleeding becomes heavier, or your flow contains clots more often.  You develop pain in your abdomen.  You lose consciousness.  You develop a rash. This information is not intended to replace advice given to you by your health care provider. Make sure you discuss any questions you have with your health care provider. Document Released: 09/01/2000 Document Revised: 02/10/2016 Document Reviewed: 11/30/2014 Elsevier Interactive Patient Education  2017 [employer] Sonohysterogram A sonohysterogram is a procedure to examine the inside of the uterus. This exam uses sound waves that are sent to a computer to make images of the lining of the uterus (endometrium). To get the best images, a germ-free, salt-water solution (sterile saline) is put into the uterus through the vagina. You may have this procedure if you have certain reproductive problems, such as abnormal bleeding, infertility, or miscarriage. This procedure can show what may be causing these problems. Possible causes include scarring or abnormal growths such as fibroids inside your uterus. It can also show if your uterus is an abnormal  shape or if the lining of the uterus is too thin. Tell a health care provider about:  All medicines you are taking, including vitamins, herbs, eye drops, creams, and over-the-counter medicines.  Any allergies you have.  Any blood disorders you have.  Any surgeries you have had.  Any medical conditions you  have.  Whether you are pregnant or may be pregnant.  The date of the first day of your last period.  Any signs of infection, such as fever, pain in your lower abdomen, or abnormal discharge from your vagina. What are the risks? Generally, this is a safe procedure. However, problems may occur, including:  Abdominal pain or cramping.  Light bleeding (spotting).  Increased vaginal discharge.  Infection. What happens before the procedure?  Your health care provider may have you take an over-the-counter pain medicine.  You may be given medicine to stop any abnormal bleeding.  You may be given antibiotic medicine to help prevent infection.  You may be asked to take a pregnancy test. This is usually in the form of a urine test.  You may have a pelvic exam.  You will be asked to empty your bladder. What happens during the procedure?  You will lie down on the exam table with your feet in stirrups or with your knees bent and your feet flat on the table.  A slender, handheld device (transducer) will be lubricated and placed into your vagina.  The transducer will be positioned to send sound waves to your uterus. The sound waves are sent to a computer and are turned into images, which your health care provider sees during the procedure.  The transducer will be removed from your vagina.  An instrument will be inserted to widen the opening of your vagina (speculum).  A swab with germ-killing solution (antiseptic) will be used to clean the opening to your uterus (cervix).  A long, thin tube (catheter) will be placed through your cervix into your uterus.  The speculum will be removed.  The transducer will be placed back into your vagina to take more images.  Your uterus will be filled with a germ-free, salt-water solution (sterile saline) through the catheter. You may feel some cramping.  A fluid that contains air bubbles may be sent through the catheter to make it easier to see  the fallopian tubes.  The transducer and catheter will be removed. The procedure may vary among health care providers and hospitals. What happens after the procedure?  It is up to you to get the results of your procedure. Ask your health care provider, or the department that is doing the procedure, when your results will be ready. Summary  A sonohysterogram is a procedure that creates images of the inside of the uterus.  The risks of this procedure are very low. Most women experience cramping and spotting after the procedure.  You may need to have a pelvic exam and take a pregnancy test before this procedure. This procedure will not be done if you are pregnant or have an infection. This information is not intended to replace advice given to you by your health care provider. Make sure you discuss any questions you have with your health care provider. Document Released: 01/19/2014 Document Revised: 07/31/2016 Document Reviewed: 07/31/2016 Elsevier Interactive Patient Education  2017 [employer].

## 2017-02-13 NOTE — Progress Notes (Signed)
Presents with complaint of irregular vaginal bleeding over the past 2 months. Has been on Lo/Ovral with good cycle control until the last 2 months. Last month she had several missed pills and attributed the irregular spotting to the missed pills. She has had no missed pills this month and has had spotting/ bleeding daily. Same partner with negative STD screen/vasectomy. Denies urinary symptoms, abdominal pain, vaginal odor or itching or fever. Has had some stress incontinence, no change. 1999 abnormal Pap  with LEEP procedure normal Paps after.  Exam: Appears well. Abdomen soft, external genitalia within normal limits, mild vaginal weakness, speculum exam dark red bleeding at cervical os no visible polyps. No odor noted, bimanual no CMT or adnexal tenderness with exam.  DUB  Plan: TSH, UPT. Megace 40 mg twice daily for 10 days, start on Lo/Ovral first day of next cycle, take daily. If continued irregular bleeding next month, schedule sonohysterogram with Dr. Audie BoxFontaine. Reviewed possibility of polyps as cause.

## 2017-02-14 ENCOUNTER — Encounter: Payer: Self-pay | Admitting: Women's Health

## 2017-03-19 ENCOUNTER — Telehealth: Payer: Self-pay | Admitting: *Deleted

## 2017-03-19 DIAGNOSIS — N926 Irregular menstruation, unspecified: Secondary | ICD-10-CM

## 2017-03-19 NOTE — Telephone Encounter (Signed)
Pt called to follow up from telephone encounter 02/13/17 still has irregular bleeding for the month of June. [facility] still I explained to patient yes, order placed. Front desk will call to schedule

## 2017-03-20 ENCOUNTER — Other Ambulatory Visit: Payer: Self-pay | Admitting: Gynecology

## 2017-03-20 DIAGNOSIS — N939 Abnormal uterine and vaginal bleeding, unspecified: Secondary | ICD-10-CM

## 2017-03-28 ENCOUNTER — Ambulatory Visit (INDEPENDENT_AMBULATORY_CARE_PROVIDER_SITE_OTHER): Payer: No Typology Code available for payment source | Admitting: Gynecology

## 2017-03-28 ENCOUNTER — Encounter: Payer: Self-pay | Admitting: Gynecology

## 2017-03-28 ENCOUNTER — Ambulatory Visit (INDEPENDENT_AMBULATORY_CARE_PROVIDER_SITE_OTHER): Payer: No Typology Code available for payment source

## 2017-03-28 ENCOUNTER — Telehealth: Payer: Self-pay | Admitting: *Deleted

## 2017-03-28 ENCOUNTER — Other Ambulatory Visit: Payer: Self-pay | Admitting: Gynecology

## 2017-03-28 VITALS — BP 116/74

## 2017-03-28 DIAGNOSIS — N921 Excessive and frequent menstruation with irregular cycle: Secondary | ICD-10-CM

## 2017-03-28 DIAGNOSIS — R32 Unspecified urinary incontinence: Secondary | ICD-10-CM

## 2017-03-28 DIAGNOSIS — N939 Abnormal uterine and vaginal bleeding, unspecified: Secondary | ICD-10-CM

## 2017-03-28 DIAGNOSIS — N926 Irregular menstruation, unspecified: Secondary | ICD-10-CM

## 2017-03-28 LAB — URINALYSIS W MICROSCOPIC + REFLEX CULTURE
Bilirubin Urine: NEGATIVE
Casts: NONE SEEN [LPF]
Crystals: NONE SEEN [HPF]
Glucose, UA: NEGATIVE
Hgb urine dipstick: NEGATIVE
Ketones, ur: NEGATIVE
Nitrite: NEGATIVE
Protein, ur: NEGATIVE
RBC / HPF: NONE SEEN RBC/HPF
Specific Gravity, Urine: 1.021 (ref 1.001–1.035)
Yeast: NONE SEEN [HPF]
pH: 6 (ref 5.0–8.0)

## 2017-03-28 NOTE — Patient Instructions (Addendum)
Office will call you with biopsy results from the ultrasound.  Schedule your annual exam in 2 months.  [facility] should call you to arrange an appointment. If you do not hear from their office in the next 2 weeks call my office.

## 2017-03-28 NOTE — Telephone Encounter (Signed)
Ref[facility]ed to [facility] they will fax me back with time and date. 

## 2017-03-28 NOTE — Progress Notes (Signed)
    [name] 1971/01/19 [id number]        46 y.o.  [id number] presents having seen [name] with several months of breakthrough bleeding on Lo/Ovral birth control pills. She was scheduled for sonohysterogram. No history of this previously. Also notes about a six-month history of urinary incontinence. Was having some stress like symptoms with coughing laughing sneezing but seems to be losing urine spontaneously now throughout the day not feeling it. Not large amounts but needs to wear a pad and changes it several times during the day. No pelvic discomfort, low back pain, vaginal symptoms such as discharge odor irritation.  Past medical history,surgical history, problem list, medications, allergies, family history and social history were all reviewed and documented in the EPIC chart.  Directed ROS with pertinent positives and negatives documented in the history of present illness/assessment and plan.  Exam: [name] assistant Vitals:   03/28/17 0903  BP: 116/74   General appearance:  Normal Abdomen soft nontender without masses guarding rebound Pelvic external BUS vagina normal. Cervix normal. Uterus normal size midline mobile nontender. Adnexa without masses or tenderness.  Ultrasound transvaginal and transabdominal shows uterus normal size and echotexture. 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 Endometrial echo try layered at 4.6 mm. Right ovary with thin echo-free avascular cyst 26 x 23 mm. Left ovary normal. Cul-de-sac negative.  Sonohysterogram performed, sterile technique, easy catheter introduction, good distention with no abnormalities. Endometrial sample taken. Patient tolerated well.  Assessment/Plan:  46 y.o. [id number] with:  1. Breakthrough bleeding times several cycles. Sonohysterogram was normal. Recent TSH also normal. Patient will follow up for biopsy results. Will keep menstrual calendar over the next 2 months. If breakthrough bleeding continues then she will call and we will  consider adjusting her birth control pill dosage. If resolves and will follow spontaneously. She is due for her annual exam and have asked her to schedule this in 2 months to allow for surveillance in the interim and then she'll follow up with us. 2. Urinary incontinence. Classic stress initially but now with spontaneous loss question sphincter issues. Check baseline urinalysis today. Recommended urology follow up and we will help her schedule this appointment and she agrees to follow up with them.    [name],[name] P MD, 9:28 AM 03/28/2017

## 2017-03-28 NOTE — Telephone Encounter (Signed)
-----   Message from Dara Lordsimothy P Fontaine, MD sent at 03/28/2017  9:32 AM EDT ----- Schedule urology appointment reference urinary incontinence

## 2017-03-29 LAB — URINE CULTURE: ORGANISM ID, BACTERIA: NO GROWTH

## 2017-04-10 NOTE — Telephone Encounter (Signed)
I received fax from [facility] stating pt insurance is not in network with them, I called pt and relayed this to her and told her to check with her insurance to find a "in network urology office" and let me know and I will refer. Pt verbalized she understood and would call me back to let me know.

## 2017-07-02 ENCOUNTER — Other Ambulatory Visit: Payer: Self-pay | Admitting: Women's Health

## 2017-07-02 DIAGNOSIS — Z3049 Encounter for surveillance of other contraceptives: Secondary | ICD-10-CM

## 2017-07-03 ENCOUNTER — Telehealth: Payer: Self-pay | Admitting: *Deleted

## 2017-07-03 DIAGNOSIS — Z3049 Encounter for surveillance of other contraceptives: Secondary | ICD-10-CM

## 2017-07-03 MED ORDER — NORGESTREL-ETHINYL ESTRADIOL 0.3-30 MG-MCG PO TABS: 1.0000 | ORAL_TABLET | Freq: Every day | ORAL | 0 refills | Status: DC

## 2017-07-03 NOTE — Telephone Encounter (Signed)
Patient has annual scheduled on 08/14/17 needs refill on birth control pills, Rx sent,pt aware

## 2017-07-23 ENCOUNTER — Encounter: Payer: Self-pay | Admitting: Women's Health

## 2017-07-23 ENCOUNTER — Ambulatory Visit: Payer: No Typology Code available for payment source | Admitting: Women's Health

## 2017-07-23 VITALS — BP 136/82

## 2017-07-23 DIAGNOSIS — L739 Follicular disorder, unspecified: Secondary | ICD-10-CM | POA: Diagnosis not present

## 2017-07-23 DIAGNOSIS — N63 Unspecified lump in unspecified breast: Secondary | ICD-10-CM

## 2017-07-23 NOTE — Progress Notes (Signed)
46 year old D WF G3 P2 with new onset left breast red slightly tender bump. Denies injury, nipple discharge, fever or drainage from site. Normal mammogram history, overdue, last mammogram 10/2014, normal. No family history of breast cancer. Monthly cycle on Lo/Ovral, same partner. History of anxiety, bilateral breast implants.  Exam: Appears well. Breast exam in sitting and lying position, no retractions, nipple discharge, palpable masses. Left breast at 2:00 areola 1 cm slightly erythematous superficial probable folliculitis.   Left breast folliculitis at areola  Plan: Reviewed superficial nature of bump, apply Neosporin twice daily, if persists after 1-2 weeks we'll get ultrasound. Reassurance given, encouraged not to touch area. Schedule screening annual mammogram in 2 weeks.

## 2017-07-23 NOTE — Patient Instructions (Signed)

## 2017-08-14 ENCOUNTER — Encounter: Payer: Self-pay | Admitting: Women's Health

## 2017-08-14 ENCOUNTER — Ambulatory Visit (INDEPENDENT_AMBULATORY_CARE_PROVIDER_SITE_OTHER): Payer: No Typology Code available for payment source | Admitting: Women's Health

## 2017-08-14 VITALS — BP 122/80 | Ht 63.0 in | Wt 142.0 lb

## 2017-08-14 DIAGNOSIS — Z01419 Encounter for gynecological examination (general) (routine) without abnormal findings: Secondary | ICD-10-CM | POA: Diagnosis not present

## 2017-08-14 LAB — CBC WITH DIFFERENTIAL/PLATELET
BASOS ABS: 50 {cells}/uL (ref 0–200)
Basophils Relative: 0.7 %
EOS ABS: 281 {cells}/uL (ref 15–500)
Eosinophils Relative: 3.9 %
HEMATOCRIT: 38 % (ref 35.0–45.0)
Hemoglobin: 12.6 g/dL (ref 11.7–15.5)
LYMPHS ABS: 2254 {cells}/uL (ref 850–3900)
MCH: 30.2 pg (ref 27.0–33.0)
MCHC: 33.2 g/dL (ref 32.0–36.0)
MCV: 91.1 fL (ref 80.0–100.0)
MPV: 9.2 fL (ref 7.5–12.5)
Monocytes Relative: 10.7 %
NEUTROS PCT: 53.4 %
Neutro Abs: 3845 cells/uL (ref 1500–7800)
PLATELETS: 351 10*3/uL (ref 140–400)
RBC: 4.17 10*6/uL (ref 3.80–5.10)
RDW: 13 % (ref 11.0–15.0)
TOTAL LYMPHOCYTE: 31.3 %
WBC: 7.2 10*3/uL (ref 3.8–10.8)
WBCMIX: 770 {cells}/uL (ref 200–950)

## 2017-08-14 LAB — COMPREHENSIVE METABOLIC PANEL
AG RATIO: 1.8 (calc) (ref 1.0–2.5)
ALKALINE PHOSPHATASE (APISO): 39 U/L (ref 33–115)
ALT: 14 U/L (ref 6–29)
AST: 15 U/L (ref 10–35)
Albumin: 4.3 g/dL (ref 3.6–5.1)
BUN: 15 mg/dL (ref 7–25)
CHLORIDE: 104 mmol/L (ref 98–110)
CO2: 24 mmol/L (ref 20–32)
CREATININE: 0.67 mg/dL (ref 0.50–1.10)
Calcium: 9.1 mg/dL (ref 8.6–10.2)
GLOBULIN: 2.4 g/dL (ref 1.9–3.7)
Glucose, Bld: 91 mg/dL (ref 65–99)
Potassium: 4.6 mmol/L (ref 3.5–5.3)
Sodium: 135 mmol/L (ref 135–146)
Total Bilirubin: 0.7 mg/dL (ref 0.2–1.2)
Total Protein: 6.7 g/dL (ref 6.1–8.1)

## 2017-08-14 MED ORDER — NORGESTIMATE-ETH ESTRADIOL 0.25-35 MG-MCG PO TABS: 1.0000 | ORAL_TABLET | Freq: Every day | ORAL | 4 refills | Status: DC

## 2017-08-14 NOTE — Progress Notes (Signed)
Veronica ReelLaura W Garner February 27, 1971 [id number]    History:    Presents for annual exam.  Monthly cycle on Lo/Ovral for with continued intermittent spotting, relates to stress. 03/2017  negative sonohysterogram, negative biopsy, spotting has been an issue for many years. 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 Has gained 11 pounds in the past year relates to eating out often.  Past medical history, past surgical history, family history and social history were all reviewed and documented in the EPIC chart. Divorced, same partner with negative STD screen. [name] 19, [name] 11 both doing well. Children's father minimal involvement. Both parents deceased, father lung cancer, mother diabetes, raised  by grandmother who died in 2016.  ROS:  A ROS was performed and pertinent positives and negatives are included.  Exam:  Vitals:   08/14/17 1003  Weight: 142 lb (64.4 kg)  Height: 5\' 3"  (1.6 m)   Body mass index is 25.15 kg/m.   General appearance:  Normal Thyroid:  Symmetrical, normal in size, without palpable masses or nodularity. Respiratory  Auscultation:  Clear without wheezing or rhonchi Cardiovascular  Auscultation:  Regular rate, without rubs, murmurs or gallops  Edema/varicosities:  Not grossly evident Abdominal  Soft,nontender, without masses, guarding or rebound.  Liver/spleen:  No organomegaly noted  Hernia:  None appreciated  Skin  Inspection:  Grossly normal   Breasts: Examined lying and sitting. Saline implants    Right: Without masses, retractions, discharge or axillary adenopathy.     Left: Without masses, retractions, discharge or axillary adenopathy. Gentitourinary   Inguinal/mons:  Normal without inguinal adenopathy  External genitalia:  Normal  BUS/Urethra/Skene's glands:  Normal  Vagina:  Normal  Cervix:  Normal  Uterus:  normal in size, shape and contour.  Midline and mobile  Adnexa/parametria:     Rt: Without masses or tenderness.   Lt: Without masses or  tenderness.  Anus and perineum: Normal  Digital rectal exam: Normal sphincter tone without palpated masses or tenderness  Assessment/Plan:  46 y.o. D WF G3 P2 for annual exam.     Monthly cycle with spotting on Lo/Ovral/ negative sonohysterogram. 1999 CIN-1 normal Paps after  Plan: Options reviewed will try Sprintec prescription, proper use given and reviewed slight risk for blood clots, had increased spotting on Loestrin in the past. SBE's, continue annual screening mammogram, calcium rich diet, vitamin D 1000 daily encouraged. Has gained 11 pounds in the past year reviewed importance of regular cardiac type exercise, low carb diet. CBC, CMP, Pap normal 2017, new screening guidelines reviewed.     Harrington Challengerancy J Alton [facility]NP, 10:04 AM 08/14/2017

## 2017-08-14 NOTE — Patient Instructions (Signed)
Carbohydrate Counting for Diabetes Mellitus, Adult Carbohydrate counting is a method for keeping track of how many carbohydrates you eat. Eating carbohydrates naturally increases the amount of sugar (glucose) in the blood. Counting how many carbohydrates you eat helps keep your blood glucose within normal limits, which helps you manage your diabetes (diabetes mellitus). It is important to know how many carbohydrates you can safely have in each meal. This is different for every person. A diet and nutrition specialist (registered dietitian) can help you make a meal plan and calculate how many carbohydrates you should have at each meal and snack. Carbohydrates are found in the following foods:  Grains, such as breads and cereals.  Dried beans and soy products.  Starchy vegetables, such as potatoes, peas, and corn.  Fruit and fruit juices.  Milk and yogurt.  Sweets and snack foods, such as cake, cookies, candy, chips, and soft drinks.  How do I count carbohydrates? There are two ways to count carbohydrates in food. You can use either of the methods or a combination of both. Reading "Nutrition Facts" on packaged food The "Nutrition Facts" list is included on the labels of almost all packaged foods and beverages in the [place] It includes:  The serving size.  Information about nutrients in each serving, including the grams (g) of carbohydrate per serving.  To use the "Nutrition Facts":  Decide how many servings you will have.  Multiply the number of servings by the number of carbohydrates per serving.  The resulting number is the total amount of carbohydrates that you will be having.  Learning standard serving sizes of other foods When you eat foods containing carbohydrates that are not packaged or do not include "Nutrition Facts" on the label, you need to measure the servings in order to count the amount of carbohydrates:  Measure the foods that you will eat with a food scale or  measuring cup, if needed.  Decide how many standard-size servings you will eat.  Multiply the number of servings by 15. Most carbohydrate-rich foods have about 15 g of carbohydrates per serving. ? For example, if you eat 8 oz (170 g) of strawberries, you will have eaten 2 servings and 30 g of carbohydrates (2 servings x 15 g = 30 g).  For foods that have more than one food mixed, such as soups and casseroles, you must count the carbohydrates in each food that is included.  The following list contains standard serving sizes of common carbohydrate-rich foods. Each of these servings has about 15 g of carbohydrates:   hamburger bun or  English muffin.   oz (15 mL) syrup.   oz (14 g) jelly.  1 slice of bread.  1 six-inch tortilla.  3 oz (85 g) cooked rice or pasta.  4 oz (113 g) cooked dried beans.  4 oz (113 g) starchy vegetable, such as peas, corn, or potatoes.  4 oz (113 g) hot cereal.  4 oz (113 g) mashed potatoes or  of a large baked potato.  4 oz (113 g) canned or frozen fruit.  4 oz (120 mL) fruit juice.  4-6 crackers.  6 chicken nuggets.  6 oz (170 g) unsweetened dry cereal.  6 oz (170 g) plain fat-free yogurt or yogurt sweetened with artificial sweeteners.  8 oz (240 mL) milk.  8 oz (170 g) fresh fruit or one small piece of fruit.  24 oz (680 g) popped popcorn.  Example of carbohydrate counting Sample meal  3 oz (85 g) chicken breast.    6 oz (170 g) brown rice.  4 oz (113 g) corn.  8 oz (240 mL) milk.  8 oz (170 g) strawberries with sugar-free whipped topping. Carbohydrate calculation 1. Identify the foods that contain carbohydrates: ? Rice. ? Corn. ? Milk. ? Strawberries. 2. Calculate how many servings you have of each food: ? 2 servings rice. ? 1 serving corn. ? 1 serving milk. ? 1 serving strawberries. 3. Multiply each number of servings by 15 g: ? 2 servings rice x 15 g = 30 g. ? 1 serving corn x 15 g = 15 g. ? 1 serving milk x 15  g = 15 g. ? 1 serving strawberries x 15 g = 15 g. 4. Add together all of the amounts to find the total grams of carbohydrates eaten: ? 30 g + 15 g + 15 g + 15 g = 75 g of carbohydrates total. This information is not intended to replace advice given to you by your health care provider. Make sure you discuss any questions you have with your health care provider. Document Released: 09/04/2005 Document Revised: 03/24/2016 Document Reviewed: 02/16/2016 Elsevier Interactive Patient Education  2018 [place] Maintenance, Female Adopting a healthy lifestyle and getting preventive care can go a long way to promote health and wellness. Talk with your health care provider about what schedule of regular examinations is right for you. This is a good chance for you to check in with your provider about disease prevention and staying healthy. In between checkups, there are plenty of things you can do on your own. Experts have done a lot of research about which lifestyle changes and preventive measures are most likely to keep you healthy. Ask your health care provider for more information. Weight and diet Eat a healthy diet  Be sure to include plenty of vegetables, fruits, low-fat dairy products, and lean protein.  Do not eat a lot of foods high in solid fats, added sugars, or salt.  Get regular exercise. This is one of the most important things you can do for your health. ? Most adults should exercise for at least 150 minutes each week. The exercise should increase your heart rate and make you sweat (moderate-intensity exercise). ? Most adults should also do strengthening exercises at least twice a week. This is in addition to the moderate-intensity exercise.  Maintain a healthy weight  Body mass index (BMI) is a measurement that can be used to identify possible weight problems. It estimates body fat based on height and weight. Your health care provider can help determine your BMI and help you  achieve or maintain a healthy weight.  For females 48 years of age and older: ? A BMI below 18.5 is considered underweight. ? A BMI of 18.5 to 24.9 is normal. ? A BMI of 25 to 29.9 is considered overweight. ? A BMI of 30 and above is considered obese.  Watch levels of cholesterol and blood lipids  You should start having your blood tested for lipids and cholesterol at 48 years of age, then have this test every 5 years.  You may need to have your cholesterol levels checked more often if: ? Your lipid or cholesterol levels are high. ? You are older than 48 years of age. ? You are at high risk for heart disease.  Cancer screening Lung Cancer  Lung cancer screening is recommended for adults 52-25 years old who are at high risk for lung cancer because of a history of smoking.  A  yearly low-dose CT scan of the lungs is recommended for people who: ? Currently smoke. ? Have quit within the past 15 years. ? Have at least a 30-pack-year history of smoking. A pack year is smoking an average of one pack of cigarettes a day for 1 year.  Yearly screening should continue until it has been 15 years since you quit.  Yearly screening should stop if you develop a health problem that would prevent you from having lung cancer treatment.  Breast Cancer  Practice breast self-awareness. This means understanding how your breasts normally appear and feel.  It also means doing regular breast self-exams. Let your health care provider know about any changes, no matter how small.  If you are in your 20s or 30s, you should have a clinical breast exam (CBE) by a health care provider every 1-3 years as part of a regular health exam.  If you are 4 or older, have a CBE every year. Also consider having a breast X-ray (mammogram) every year.  If you have a family history of breast cancer, talk to your health care provider about genetic screening.  If you are at high risk for breast cancer, talk to your health  care provider about having an MRI and a mammogram every year.  Breast cancer gene (BRCA) assessment is recommended for women who have family members with BRCA-related cancers. BRCA-related cancers include: ? Breast. ? Ovarian. ? Tubal. ? Peritoneal cancers.  Results of the assessment will determine the need for genetic counseling and BRCA1 and BRCA2 testing.  Cervical Cancer Your health care provider may recommend that you be screened regularly for cancer of the pelvic organs (ovaries, uterus, and vagina). This screening involves a pelvic examination, including checking for microscopic changes to the surface of your cervix (Pap test). You may be encouraged to have this screening done every 3 years, beginning at age 12.  For women ages 13-65, health care providers may recommend pelvic exams and Pap testing every 3 years, or they may recommend the Pap and pelvic exam, combined with testing for human papilloma virus (HPV), every 5 years. Some types of HPV increase your risk of cervical cancer. Testing for HPV may also be done on women of any age with unclear Pap test results.  Other health care providers may not recommend any screening for nonpregnant women who are considered low risk for pelvic cancer and who do not have symptoms. Ask your health care provider if a screening pelvic exam is right for you.  If you have had past treatment for cervical cancer or a condition that could lead to cancer, you need Pap tests and screening for cancer for at least 20 years after your treatment. If Pap tests have been discontinued, your risk factors (such as having a new sexual partner) need to be reassessed to determine if screening should resume. Some women have medical problems that increase the chance of getting cervical cancer. In these cases, your health care provider may recommend more frequent screening and Pap tests.  Colorectal Cancer  This type of cancer can be detected and often  prevented.  Routine colorectal cancer screening usually begins at 48 years of age and continues through 48 years of age.  Your health care provider may recommend screening at an earlier age if you have risk factors for colon cancer.  Your health care provider may also recommend using home test kits to check for hidden blood in the stool.  A small camera at the end of  a tube can be used to examine your colon directly (sigmoidoscopy or colonoscopy). This is done to check for the earliest forms of colorectal cancer.  Routine screening usually begins at age 45.  Direct examination of the colon should be repeated every 5-10 years through 48 years of age. However, you may need to be screened more often if early forms of precancerous polyps or small growths are found.  Skin Cancer  Check your skin from head to toe regularly.  Tell your health care provider about any new moles or changes in moles, especially if there is a change in a mole's shape or color.  Also tell your health care provider if you have a mole that is larger than the size of a pencil eraser.  Always use sunscreen. Apply sunscreen liberally and repeatedly throughout the day.  Protect yourself by wearing long sleeves, pants, a wide-brimmed hat, and sunglasses whenever you are outside.  Heart disease, diabetes, and high blood pressure  High blood pressure causes heart disease and increases the risk of stroke. High blood pressure is more likely to develop in: ? People who have blood pressure in the high end of the normal range (130-139/85-89 mm Hg). ? People who are overweight or obese. ? People who are African American.  If you are 65-44 years of age, have your blood pressure checked every 3-5 years. If you are 35 years of age or older, have your blood pressure checked every year. You should have your blood pressure measured twice-once when you are at a hospital or clinic, and once when you are not at a hospital or clinic.  Record the average of the two measurements. To check your blood pressure when you are not at a hospital or clinic, you can use: ? An automated blood pressure machine at a pharmacy. ? A home blood pressure monitor.  If you are between 55 years and 4 years old, ask your health care provider if you should take aspirin to prevent strokes.  Have regular diabetes screenings. This involves taking a blood sample to check your fasting blood sugar level. ? If you are at a normal weight and have a low risk for diabetes, have this test once every three years after 48 years of age. ? If you are overweight and have a high risk for diabetes, consider being tested at a younger age or more often. Preventing infection Hepatitis B  If you have a higher risk for hepatitis B, you should be screened for this virus. You are considered at high risk for hepatitis B if: ? You were born in a country where hepatitis B is common. Ask your health care provider which countries are considered high risk. ? Your parents were born in a high-risk country, and you have not been immunized against hepatitis B (hepatitis B vaccine). ? You have HIV or AIDS. ? You use needles to inject street drugs. ? You live with someone who has hepatitis B. ? You have had sex with someone who has hepatitis B. ? You get hemodialysis treatment. ? You take certain medicines for conditions, including cancer, organ transplantation, and autoimmune conditions.  Hepatitis C  Blood testing is recommended for: ? Everyone born from [birth year] through 1967. ? Anyone with known risk factors for hepatitis C.  Sexually transmitted infections (STIs)  You should be screened for sexually transmitted infections (STIs) including gonorrhea and chlamydia if: ? You are sexually active and are younger than 48 years of age. ? You are older than  48 years of age and your health care provider tells you that you are at risk for this type of infection. ? Your sexual  activity has changed since you were last screened and you are at an increased risk for chlamydia or gonorrhea. Ask your health care provider if you are at risk.  If you do not have HIV, but are at risk, it may be recommended that you take a prescription medicine daily to prevent HIV infection. This is called pre-exposure prophylaxis (PrEP). You are considered at risk if: ? You are sexually active and do not regularly use condoms or know the HIV status of your partner(s). ? You take drugs by injection. ? You are sexually active with a partner who has HIV.  Talk with your health care provider about whether you are at high risk of being infected with HIV. If you choose to begin PrEP, you should first be tested for HIV. You should then be tested every 3 months for as long as you are taking PrEP. Pregnancy  If you are premenopausal and you may become pregnant, ask your health care provider about preconception counseling.  If you may become pregnant, take 400 to 800 micrograms (mcg) of folic acid every day.  If you want to prevent pregnancy, talk to your health care provider about birth control (contraception). Osteoporosis and menopause  Osteoporosis is a disease in which the bones lose minerals and strength with aging. This can result in serious bone fractures. Your risk for osteoporosis can be identified using a bone density scan.  If you are 29 years of age or older, or if you are at risk for osteoporosis and fractures, ask your health care provider if you should be screened.  Ask your health care provider whether you should take a calcium or vitamin D supplement to lower your risk for osteoporosis.  Menopause may have certain physical symptoms and risks.  Hormone replacement therapy may reduce some of these symptoms and risks. Talk to your health care provider about whether hormone replacement therapy is right for you. Follow these instructions at home:  Schedule regular health, dental,  and eye exams.  Stay current with your immunizations.  Do not use any tobacco products including cigarettes, chewing tobacco, or electronic cigarettes.  If you are pregnant, do not drink alcohol.  If you are breastfeeding, limit how much and how often you drink alcohol.  Limit alcohol intake to no more than 1 drink per day for nonpregnant women. One drink equals 12 ounces of beer, 5 ounces of wine, or 1 ounces of hard liquor.  Do not use street drugs.  Do not share needles.  Ask your health care provider for help if you need support or information about quitting drugs.  Tell your health care provider if you often feel depressed.  Tell your health care provider if you have ever been abused or do not feel safe at home. This information is not intended to replace advice given to you by your health care provider. Make sure you discuss any questions you have with your health care provider. Document Released: 03/20/2011 Document Revised: 02/10/2016 Document Reviewed: 06/08/2015 Elsevier Interactive Patient Education  [employer].

## 2017-08-15 ENCOUNTER — Encounter: Payer: Self-pay | Admitting: Women's Health

## 2017-08-15 LAB — URINALYSIS W MICROSCOPIC + REFLEX CULTURE
BILIRUBIN URINE: NEGATIVE
Bacteria, UA: NONE SEEN /HPF
GLUCOSE, UA: NEGATIVE
HYALINE CAST: NONE SEEN /LPF
Ketones, ur: NEGATIVE
Leukocyte Esterase: NEGATIVE
NITRITES URINE, INITIAL: NEGATIVE
PH: 7 (ref 5.0–8.0)
PROTEIN: NEGATIVE
SPECIFIC GRAVITY, URINE: 1.022 (ref 1.001–1.03)
Squamous Epithelial / LPF: NONE SEEN /HPF (ref <=5)
WBC, UA: NONE SEEN /HPF (ref 0–5)

## 2017-08-15 LAB — NO CULTURE INDICATED

## 2017-08-21 ENCOUNTER — Encounter: Payer: Self-pay | Admitting: Women's Health

## 2017-09-04 ENCOUNTER — Emergency Department (HOSPITAL_COMMUNITY)
Admission: EM | Admit: 2017-09-04 | Discharge: 2017-09-04 | Disposition: A | Discharge status: Home / Self Care | Payer: PRIVATE HEALTH INSURANCE | Attending: Emergency Medicine | Admitting: Emergency Medicine

## 2017-09-04 ENCOUNTER — Other Ambulatory Visit: Payer: Self-pay

## 2017-09-04 ENCOUNTER — Encounter (HOSPITAL_COMMUNITY): Payer: Self-pay | Admitting: Emergency Medicine

## 2017-09-04 DIAGNOSIS — Z79899 Other long term (current) drug therapy: Secondary | ICD-10-CM | POA: Diagnosis not present

## 2017-09-04 DIAGNOSIS — Y929 Unspecified place or not applicable: Secondary | ICD-10-CM | POA: Insufficient documentation

## 2017-09-04 DIAGNOSIS — W5501XA Bitten by cat, initial encounter: Secondary | ICD-10-CM | POA: Insufficient documentation

## 2017-09-04 DIAGNOSIS — Z23 Encounter for immunization: Secondary | ICD-10-CM | POA: Diagnosis not present

## 2017-09-04 DIAGNOSIS — Y999 Unspecified external cause status: Secondary | ICD-10-CM | POA: Insufficient documentation

## 2017-09-04 DIAGNOSIS — Y939 Activity, unspecified: Secondary | ICD-10-CM | POA: Insufficient documentation

## 2017-09-04 DIAGNOSIS — S81812A Laceration without foreign body, left lower leg, initial encounter: Secondary | ICD-10-CM | POA: Diagnosis present

## 2017-09-04 DIAGNOSIS — T148XXA Other injury of unspecified body region, initial encounter: Secondary | ICD-10-CM

## 2017-09-04 MED ORDER — AMOXICILLIN-POT CLAVULANATE 875-125 MG PO TABS
1.0000 | ORAL_TABLET | Freq: Once | ORAL | Status: AC
  Administered 2017-09-04: 1 via ORAL
  Filled 2017-09-04: qty 1

## 2017-09-04 MED ORDER — TETANUS-DIPHTH-ACELL PERTUSSIS 5-2.5-18.5 LF-MCG/0.5 IM SUSP
0.5000 mL | Freq: Once | INTRAMUSCULAR | Status: AC
  Administered 2017-09-04: 0.5 mL via INTRAMUSCULAR
  Filled 2017-09-04: qty 0.5

## 2017-09-04 MED ORDER — AMOXICILLIN-POT CLAVULANATE 875-125 MG PO TABS: 1.0000 | ORAL_TABLET | Freq: Two times a day (BID) | ORAL | 0 refills | Status: DC

## 2017-09-04 NOTE — ED Triage Notes (Signed)
Pt reports trying to break up two of her cats fighting when one attacked her leg, pt has multiple scratches and bites to lower left leg. States cat is up to date on vaccines, unsure of last tetanus, cleaned wounds at home with peroxide.

## 2017-09-04 NOTE — ED Provider Notes (Signed)
[facility] EMERGENCY DEPARTMENT Provider Note   CSN: [id number] Arrival date & time: 09/04/17  0302     History   Chief Complaint Chief Complaint  Patient presents with  . Animal Bite    HPI [name] is a 46 y.o. female.  46 year old female presents to the emergency department for evaluation of puncture wounds and scratches to her left lower extremity.  She states that she was trying to break up 2 of her cats that were fighting when one attacked her leg.  Cats are up-to-date on vaccinations.  Patient cleaned the wound with peroxide prior to arrival.  She complains of mild pain to wound sites.  She is unsure of the date of her last tetanus shot.  She has not noticed any purulent drainage from the wounds.  No fever since onset of incident.   The history is provided by the patient. No language interpreter was used.  Animal Bite    Past Medical History:  Diagnosis Date  . Anxiety   . Depression     Patient Active Problem List   Diagnosis Date Noted  . H/O gestational diabetes mellitus, not currently pregnant   . CIN I (cervical intraepithelial neoplasia I)   . Anxiety   . Depression     Past Surgical History:  Procedure Laterality Date  . AUGMENTATION MAMMAPLASTY  2005  . CERVICAL BIOPSY  W/ LOOP ELECTRODE EXCISION  1999   CIN I    OB History    Gravida Para Term Preterm AB Living   3 2     1 2    SAB TAB Ectopic Multiple Live Births                   Home Medications    Prior to Admission medications   Medication Sig Start Date End Date Taking? Authorizing Provider  ALPRAZolam (XANAX) 0.25 MG tablet Take 1 tablet (0.25 mg total) by mouth at bedtime as needed for anxiety. 04/20/16   Harrington ChallengerYoung, Nancy J, NP  amoxicillin-clavulanate (AUGMENTIN) 875-125 MG tablet Take 1 tablet by mouth every 12 (twelve) hours. 09/04/17   Antony MaduraHumes, Karren Newland, PA-C  norgestimate-ethinyl estradiol (ORTHO-CYCLEN,SPRINTEC,PREVIFEM) 0.25-35 MG-MCG tablet Take 1 tablet by  mouth daily. 08/14/17   Harrington ChallengerYoung, Nancy J, NP    Family History Family History  Problem Relation Age of Onset  . Diabetes Mother   . Cancer Father        RENAL CELL  . Heart disease Maternal Grandmother   . Heart disease Paternal Grandmother   . Hypertension Paternal Grandmother   . Heart disease Paternal Grandfather     Social History Social History   Tobacco Use  . Smoking status: Never Smoker  . Smokeless tobacco: Never Used  Substance Use Topics  . Alcohol use: Yes    Comment: occassionaly  . Drug use: No     Allergies   Patient has no known allergies.   Review of Systems Review of Systems Ten systems reviewed and are negative for acute change, except as noted in the HPI.    Physical Exam Updated Vital Signs BP (!) 167/92   Pulse 92   Temp 97.9 F (36.6 C) (Oral)   Resp 15   Ht 5\' 3"  (1.6 m)   Wt 64.4 kg (142 lb)   LMP 08/14/2017 Comment: spotting  SpO2 100%   BMI 25.15 kg/m   Physical Exam  Constitutional: She is oriented to person, place, and time. She appears well-developed and well-nourished. No  distress.  Nontoxic appearing. Pleasant.  HENT:  Head: Normocephalic and atraumatic.  Eyes: Conjunctivae and EOM are normal. No scleral icterus.  Neck: Normal range of motion.  Cardiovascular: Normal rate, regular rhythm and intact distal pulses.  DP pulse 2+ in the LLE  Pulmonary/Chest: Effort normal. No respiratory distress.  Musculoskeletal: Normal range of motion.  Neurological: She is alert and oriented to person, place, and time. She exhibits normal muscle tone. Coordination normal.  Sensation to light touch intact in the left lower extremity.  Skin: Skin is warm and dry. No rash noted. She is not diaphoretic. No erythema. No pallor.  Multiple puncture wounds and superficial lacerations to the left lower extremity distal to the knee.  Mild ecchymosis around puncture sites of the medial left lower extremity.  No purulent discharge or drainage, heat to  touch, significant erythema.  Psychiatric: She has a normal mood and affect. Her behavior is normal.  Nursing note and vitals reviewed.    ED Treatments / Results  Labs (all labs ordered are listed, but only abnormal results are displayed) Labs Reviewed - No data to display  EKG  EKG Interpretation None       Radiology No results found.  Procedures Procedures (including critical care time)  Medications Ordered in ED Medications  Tdap (BOOSTRIX) injection 0.5 mL (not administered)  amoxicillin-clavulanate (AUGMENTIN) 875-125 MG per tablet 1 tablet (not administered)     Initial Impression / Assessment and Plan / ED Course  I have reviewed the triage vital signs and the nursing notes.  Pertinent labs & imaging results that were available during my care of the patient were reviewed by me and considered in my medical decision making (see chart for details).     46 year old female presents to the emergency department for evaluation of puncture wounds and lacerations to the left lower extremity after 1 of her cats attacked her leg.  This occurred as the patient was trying to stop 2 of her cats from fighting.  Patient neurovascularly intact.  No current signs of secondary infection or cellulitis.  Have discussed the need to leave wounds open and have them heal on their own.  Will start on Augmentin.  First dose given in the emergency department.  Tetanus updated.  Return precautions discussed and provided. Patient discharged in stable condition with no unaddressed concerns.   Final Clinical Impressions(s) / ED Diagnoses   Final diagnoses:  Cat bite, initial encounter  Superficial laceration    ED Discharge Orders        Ordered    amoxicillin-clavulanate (AUGMENTIN) 875-125 MG tablet  Every 12 hours     09/04/17 0529       Antony MaduraHumes, Janitza Revuelta, PA-C 09/04/17 0541    Wilkie AyeHorton, Mayer Maskerourtney F, MD 09/04/17 [phone]

## 2017-09-22 ENCOUNTER — Other Ambulatory Visit: Payer: Self-pay | Admitting: Women's Health

## 2017-09-22 DIAGNOSIS — Z3049 Encounter for surveillance of other contraceptives: Secondary | ICD-10-CM

## 2017-11-06 ENCOUNTER — Telehealth: Payer: Self-pay | Admitting: *Deleted

## 2017-11-06 MED ORDER — NORETHIN ACE-ETH ESTRAD-FE 1-20 MG-MCG(24) PO TABS: 1.0000 | ORAL_TABLET | Freq: Every day | ORAL | 3 refills | Status: DC

## 2017-11-06 NOTE — Telephone Encounter (Signed)
Pt called c/o spotting on birth control pills sprintec, states this month daily spotting. Takes daily on time, asked if pills can be switched? Please advise

## 2017-11-06 NOTE — Telephone Encounter (Signed)
Pt informed, Rx sent. 

## 2017-11-06 NOTE — Telephone Encounter (Signed)
Please call and review we could try a different pill, could try lo estrin, I think she has also tried low overall number past. Please E scribe Loestrin 1/20 take for 24 days not just 21, take daily. Reviewed will need to start a new pack prior to end of first pack #3 with 3 refills. she does have a history of negative STD screen as well as negative sonohysterogram with negative biopsy.

## 2018-10-08 ENCOUNTER — Ambulatory Visit (INDEPENDENT_AMBULATORY_CARE_PROVIDER_SITE_OTHER): Payer: No Typology Code available for payment source | Admitting: Women's Health

## 2018-10-08 ENCOUNTER — Encounter: Payer: Self-pay | Admitting: Women's Health

## 2018-10-08 VITALS — BP 122/78 | Ht 63.0 in | Wt 137.0 lb

## 2018-10-08 DIAGNOSIS — Z01419 Encounter for gynecological examination (general) (routine) without abnormal findings: Secondary | ICD-10-CM

## 2018-10-08 DIAGNOSIS — F411 Generalized anxiety disorder: Secondary | ICD-10-CM

## 2018-10-08 DIAGNOSIS — N926 Irregular menstruation, unspecified: Secondary | ICD-10-CM

## 2018-10-08 MED ORDER — ALPRAZOLAM 0.25 MG PO TABS: 0.2500 mg | ORAL_TABLET | Freq: Every evening | ORAL | 1 refills | Status: DC | PRN

## 2018-10-08 MED ORDER — NORETHIN ACE-ETH ESTRAD-FE 1-20 MG-MCG(24) PO TABS: 1.0000 | ORAL_TABLET | Freq: Every day | ORAL | 4 refills | Status: DC

## 2018-10-08 NOTE — Progress Notes (Signed)
[name] 1970-12-20 [id number]    History:    Presents for annual exam.  Monthly cycle on Loestrin 24, continues to have occasional intermenstrual bleeding, had a negative sonohysterogram and biopsy 2018.  No change with spotting with change of OCs.  Same partner for years no break ups.  1991 CIN-1 with normal Paps after.  History of GDM.  Past medical history, past surgical history, family history and social history were all reviewed and documented in the EPIC chart.  Divorced, children's father minimal contact, no financial support, drug abuse history.  [name] 20 epilepsy, Aundra Millet 12 doing well.  Partner  has 3 children, 2 teenagers.  Both parents deceased, father lung cancer, mother diabetes.  Raised by grandmother.  ROS:  A ROS was performed and pertinent positives and negatives are included.  Exam:  Vitals:   10/08/18 1202  BP: 122/78  Weight: 137 lb (62.1 kg)  Height: 5\' 3"  (1.6 m)   Body mass index is 24.27 kg/m.   General appearance:  Normal Thyroid:  Symmetrical, normal in size, without palpable masses or nodularity. Respiratory  Auscultation:  Clear without wheezing or rhonchi Cardiovascular  Auscultation:  Regular rate, without rubs, murmurs or gallops  Edema/varicosities:  Not grossly evident Abdominal  Soft,nontender, without masses, guarding or rebound.  Liver/spleen:  No organomegaly noted  Hernia:  None appreciated  Skin  Inspection:  Grossly normal   Breasts: Examined lying and sitting. Bilateral saline implants/history of unsymmetrical breasts    Right: Without masses, retractions, discharge or axillary adenopathy.     Left: Without masses, retractions, discharge or axillary adenopathy. Gentitourinary   Inguinal/mons:  Normal without inguinal adenopathy  External genitalia:  Normal  BUS/Urethra/Skene's glands:  Normal  Vagina:  Normal  Cervix:  Normal  Uterus:   normal in size, shape and contour.  Midline and mobile  Adnexa/parametria:      Rt: Without masses or tenderness.   Lt: Without masses or tenderness.  Anus and perineum: Normal  Digital rectal exam: Normal sphincter tone without palpated masses or tenderness  Assessment/Plan:  48 y.o. DWF G3 P2 for annual exam.    Monthly cycle on Loestrin 24 with intermenstrual spotting 1999 CIN-1 normal Paps after  Plan: Loestrin 24 prescription, proper use given and reviewed slight risk for blood clots and strokes.  SBEs, continue annual screening mammogram, calcium rich foods, vitamin D 1000 daily encouraged.  Instructed to call if spotting increases or changes.  Xanax  0.25 uses sparingly prescription refill given.  Aware of addictive properties.  CBC, TSH, CMP, Pap with HR HPV typing, new screening guidelines reviewed.     Harrington Challenger [facility], 12:38 PM 10/08/2018

## 2018-10-08 NOTE — Patient Instructions (Signed)

## 2018-10-09 LAB — PAP, TP IMAGING W/ HPV RNA, RFLX HPV TYPE 16,18/45: HPV DNA HIGH RISK: NOT DETECTED

## 2018-10-11 LAB — CBC WITH DIFFERENTIAL/PLATELET
Absolute Monocytes: 564 cells/uL (ref 200–950)
BASOS ABS: 40 {cells}/uL (ref 0–200)
Basophils Relative: 0.7 %
Eosinophils Absolute: 251 cells/uL (ref 15–500)
Eosinophils Relative: 4.4 %
HCT: 36.6 % (ref 35.0–45.0)
Hemoglobin: 12.3 g/dL (ref 11.7–15.5)
Lymphs Abs: 1801 cells/uL (ref 850–3900)
MCH: 30.6 pg (ref 27.0–33.0)
MCHC: 33.6 g/dL (ref 32.0–36.0)
MCV: 91 fL (ref 80.0–100.0)
MONOS PCT: 9.9 %
MPV: 9.3 fL (ref 7.5–12.5)
Neutro Abs: 3044 cells/uL (ref 1500–7800)
Neutrophils Relative %: 53.4 %
PLATELETS: 310 10*3/uL (ref 140–400)
RBC: 4.02 10*6/uL (ref 3.80–5.10)
RDW: 13 % (ref 11.0–15.0)
TOTAL LYMPHOCYTE: 31.6 %
WBC: 5.7 10*3/uL (ref 3.8–10.8)

## 2018-10-11 LAB — COMPREHENSIVE METABOLIC PANEL
AG RATIO: 1.7 (calc) (ref 1.0–2.5)
ALKALINE PHOSPHATASE (APISO): 38 U/L (ref 33–115)
ALT: 15 U/L (ref 6–29)
AST: 15 U/L (ref 10–35)
Albumin: 4 g/dL (ref 3.6–5.1)
BUN: 11 mg/dL (ref 7–25)
CHLORIDE: 106 mmol/L (ref 98–110)
CO2: 23 mmol/L (ref 20–32)
CREATININE: 0.78 mg/dL (ref 0.50–1.10)
Calcium: 8.9 mg/dL (ref 8.6–10.2)
GLUCOSE: 107 mg/dL — AB (ref 65–99)
Globulin: 2.4 g/dL (calc) (ref 1.9–3.7)
Potassium: 4 mmol/L (ref 3.5–5.3)
Sodium: 138 mmol/L (ref 135–146)
Total Bilirubin: 0.4 mg/dL (ref 0.2–1.2)
Total Protein: 6.4 g/dL (ref 6.1–8.1)

## 2018-10-11 LAB — TEST AUTHORIZATION

## 2018-10-11 LAB — TSH: TSH: 2.68 m[IU]/L

## 2018-10-11 LAB — HEMOGLOBIN A1C W/OUT EAG: HEMOGLOBIN A1C: 5.6 %{Hb} (ref <5.7)

## 2018-11-18 ENCOUNTER — Emergency Department (HOSPITAL_BASED_OUTPATIENT_CLINIC_OR_DEPARTMENT_OTHER)
Admission: EM | Admit: 2018-11-18 | Discharge: 2018-11-18 | Disposition: A | Discharge status: Home / Self Care | Payer: No Typology Code available for payment source | Attending: Emergency Medicine | Admitting: Emergency Medicine

## 2018-11-18 ENCOUNTER — Encounter (HOSPITAL_BASED_OUTPATIENT_CLINIC_OR_DEPARTMENT_OTHER): Payer: Self-pay | Admitting: Emergency Medicine

## 2018-11-18 ENCOUNTER — Telehealth: Payer: Self-pay | Admitting: *Deleted

## 2018-11-18 ENCOUNTER — Other Ambulatory Visit: Payer: Self-pay

## 2018-11-18 ENCOUNTER — Other Ambulatory Visit: Payer: Self-pay | Admitting: Women's Health

## 2018-11-18 ENCOUNTER — Emergency Department (HOSPITAL_BASED_OUTPATIENT_CLINIC_OR_DEPARTMENT_OTHER): Payer: No Typology Code available for payment source

## 2018-11-18 DIAGNOSIS — Z79899 Other long term (current) drug therapy: Secondary | ICD-10-CM | POA: Diagnosis not present

## 2018-11-18 DIAGNOSIS — I10 Essential (primary) hypertension: Secondary | ICD-10-CM | POA: Diagnosis present

## 2018-11-18 LAB — URINALYSIS, ROUTINE W REFLEX MICROSCOPIC
BILIRUBIN URINE: NEGATIVE
Glucose, UA: NEGATIVE mg/dL
Ketones, ur: NEGATIVE mg/dL
Leukocytes,Ua: NEGATIVE
Nitrite: NEGATIVE
Protein, ur: NEGATIVE mg/dL
SPECIFIC GRAVITY, URINE: 1.025 (ref 1.005–1.030)
pH: 6 (ref 5.0–8.0)

## 2018-11-18 LAB — CBC WITH DIFFERENTIAL/PLATELET
Abs Immature Granulocytes: 0.01 10*3/uL (ref 0.00–0.07)
Basophils Absolute: 0 10*3/uL (ref 0.0–0.1)
Basophils Relative: 1 %
Eosinophils Absolute: 0.2 10*3/uL (ref 0.0–0.5)
Eosinophils Relative: 3 %
HCT: 39.3 % (ref 36.0–46.0)
Hemoglobin: 13 g/dL (ref 12.0–15.0)
IMMATURE GRANULOCYTES: 0 %
Lymphocytes Relative: 31 %
Lymphs Abs: 1.8 10*3/uL (ref 0.7–4.0)
MCH: 30.4 pg (ref 26.0–34.0)
MCHC: 33.1 g/dL (ref 30.0–36.0)
MCV: 92 fL (ref 80.0–100.0)
MONOS PCT: 8 %
Monocytes Absolute: 0.5 10*3/uL (ref 0.1–1.0)
Neutro Abs: 3.3 10*3/uL (ref 1.7–7.7)
Neutrophils Relative %: 57 %
Platelets: 306 10*3/uL (ref 150–400)
RBC: 4.27 MIL/uL (ref 3.87–5.11)
RDW: 13.4 % (ref 11.5–15.5)
WBC: 5.7 10*3/uL (ref 4.0–10.5)
nRBC: 0 % (ref 0.0–0.2)

## 2018-11-18 LAB — URINALYSIS, MICROSCOPIC (REFLEX)

## 2018-11-18 LAB — BASIC METABOLIC PANEL
Anion gap: 8 (ref 5–15)
BUN: 12 mg/dL (ref 6–20)
CO2: 22 mmol/L (ref 22–32)
Calcium: 8.7 mg/dL — ABNORMAL LOW (ref 8.9–10.3)
Chloride: 105 mmol/L (ref 98–111)
Creatinine, Ser: 0.53 mg/dL (ref 0.44–1.00)
GFR calc Af Amer: >60 mL/min (ref >60)
GFR calc non Af Amer: >60 mL/min (ref >60)
Glucose, Bld: 106 mg/dL — ABNORMAL HIGH (ref 70–99)
Potassium: 3.5 mmol/L (ref 3.5–5.1)
Sodium: 135 mmol/L (ref 135–145)

## 2018-11-18 LAB — PREGNANCY, URINE: Preg Test, Ur: NEGATIVE

## 2018-11-18 LAB — TROPONIN I: Troponin I: <0.03 ng/mL (ref <0.03)

## 2018-11-18 MED ORDER — NORETHINDRONE 0.35 MG PO TABS: 1.0000 | ORAL_TABLET | Freq: Every day | ORAL | 4 refills | Status: DC

## 2018-11-18 NOTE — ED Triage Notes (Signed)
Per pt her BP is running from 161/113, with HA and blurred vision for the past few days, no prior hx of HTN.

## 2018-11-18 NOTE — ED Notes (Signed)
ED Provider at bedside. 

## 2018-11-18 NOTE — Discharge Instructions (Addendum)
Steps to find a Primary Care Provider (PCP): ° °Call [phone] or [phone] to access "[facility] Find a Doctor Service." ° °2.  You may also go on the [facility] website at [email or web address] ° °3.  [facility] also frequently accepts new patients. ° °[facility]  °[address] °[place] [address] °[phone] ° °4.  There are also multiple [facility], [facility] and [facility] practices throughout the [place] that are frequently accepting new patients. You may find a clinic that is close to your home and contact them. ° °[facility] °[email or web address] °[phone] ° °[facility] °[email or web address] ° °[facility] °[email or web address] °[phone] ° °[facility] °[email or web address] °[phone] ° °5.  Local Health Departments also can provide primary care services. ° °[facility]  °[address] °[place] [address] [phone] ° °[facility] °[address] °[place] [address] [phone] ° °[facility] °[address]  °[place] [address] °[phone] ° ° °

## 2018-11-18 NOTE — ED Provider Notes (Signed)
TIME SEEN: 2:50 AM  CHIEF COMPLAINT: Hypertension, "chest pounding", shortness of breath  HPI: Patient is a 48 year old female with history of anxiety who presents to the emergency department with complaints of hypertension.  States that her blood pressure is normally in the 110 to 120s/70s to 80s.  She is not on blood pressure medication.  States this week she has not been feeling well and was diagnosed with a "sinus infection" at urgent care and was started on doxycycline.  States that her blood pressure at the urgent care's office was elevated which they thought was secondary to use of Claritin-D.  States she has stopped taking decongestants but her blood pressure continues to be elevated.  Tonight she states she woke up from sleep with pounding in her chest and feeling short of breath.  No chest pressure or chest tightness.  States she checked her blood pressure and it was 160/110's.  States this is very high for her.  She did have a mild headache earlier today which she took aspirin for.  She thinks this is related to her sinus infection.  She denies any numbness, tingling or focal weakness.  Did have some mild blurry vision with no vision loss or double vision but this has resolved.  States that her birth control was recently changed by her OB/GYN in January.  She does not check her blood pressures regularly until this week.  She has had some blood pressures this week in the 150s/90s.  States that she drinks an occasional sweet tea but no other increased caffeine intake.  Denies any other stimulants.  No illicit drug use.  She is checking her blood pressure with a wrist cuff.  ROS: See HPI Constitutional: no fever  Eyes: no drainage  ENT: no runny nose   Cardiovascular:   chest pain  Resp:  SOB  GI: no vomiting GU: no dysuria Integumentary: no rash  Allergy: no hives  Musculoskeletal: no leg swelling  Neurological: no slurred speech ROS otherwise negative  PAST MEDICAL HISTORY/PAST SURGICAL  HISTORY:  Past Medical History:  Diagnosis Date  . Anxiety   . Depression     MEDICATIONS:  Prior to Admission medications   Medication Sig Start Date End Date Taking? Authorizing Provider  ALPRAZolam (XANAX) 0.25 MG tablet Take 1 tablet (0.25 mg total) by mouth at bedtime as needed for anxiety. 10/08/18   Harrington Challenger, NP  Norethindrone Acetate-Ethinyl Estrad-FE (LOESTRIN 24 FE) 1-20 MG-MCG(24) tablet Take 1 tablet by mouth daily. 10/08/18   Harrington Challenger, NP    ALLERGIES:  No Known Allergies  SOCIAL HISTORY:  Social History   Tobacco Use  . Smoking status: Never Smoker  . Smokeless tobacco: Never Used  Substance Use Topics  . Alcohol use: Yes    Comment: occassionaly    FAMILY HISTORY: Family History  Problem Relation Age of Onset  . Diabetes Mother   . Cancer Father        RENAL CELL  . Heart disease Maternal Grandmother   . Heart disease Paternal Grandmother   . Hypertension Paternal Grandmother   . Heart disease Paternal Grandfather     EXAM: BP (!) 186/98 (BP Location: Right Arm)   Pulse 96   Temp 97.8 F (36.6 C) (Oral)   Resp 18   Ht 5\' 3"  (1.6 m)   Wt 61.2 kg   LMP 11/11/2018   SpO2 100%   BMI 23.91 kg/m  CONSTITUTIONAL: Alert and oriented and responds appropriately to questions. Well-appearing;  well-nourished HEAD: Normocephalic EYES: Conjunctivae clear, pupils appear equal, EOMI ENT: normal nose; moist mucous membranes NECK: Supple, no meningismus, no nuchal rigidity, no LAD  CARD: RRR; S1 and S2 appreciated; no murmurs, no clicks, no rubs, no gallops RESP: Normal chest excursion without splinting or tachypnea; breath sounds clear and equal bilaterally; no wheezes, no rhonchi, no rales, no hypoxia or respiratory distress, speaking full sentences ABD/GI: Normal bowel sounds; non-distended; soft, non-tender, no rebound, no guarding, no peritoneal signs, no hepatosplenomegaly BACK:  The back appears normal and is non-tender to palpation, there is  no CVA tenderness EXT: Normal ROM in all joints; non-tender to palpation; no edema; normal capillary refill; no cyanosis, no calf tenderness or swelling    SKIN: Normal color for age and race; warm; no rash NEURO: Moves all extremities equally; Strength 5/5 in all four extremities.  Normal sensation diffusely.  CN 2-12 grossly intact.  No dysmetria to finger to nose testing bilaterally.  Normal speech.  Normal gait. PSYCH: Appears anxious.  MEDICAL DECISION MAKING: Patient here with hypertension.  Currently asymptomatic but previously did have some chest pounding and shortness of breath.  She has a blood pressure 186/98 currently.  She is on doxycycline for a sinus infection.  Her OB/GYN recently changed her birth control.  No other changes in medication and no illicit drug use, increased caffeine or stimulant intake.  Was previously using a decongestant for her sinus congestion but stopped this several days ago.  EKG shows no ischemic changes, arrhythmia or interval abnormality.  Will check labs, urine to ensure no signs of endorgan damage and continue to monitor patient in the ED.  I suspect that her blood pressure will improve with time.  No headache, vision changes or neurologic deficits at this time to suggest stroke or intracranial hemorrhage.  I do not feel she needs head imaging emergently.  She does not currently have a primary care physician for follow-up.  Will make sure she has a list of primary care doctors for close follow-up as an outpatient.  ED PROGRESS: Patient still asymptomatic.  Blood pressure has improved to 145/85 without intervention.  Given this normal blood pressure today, I do not feel she needs to be started on blood pressure medication from the ED.  Have recommended close follow-up with her primary care physician and we will give her a list of outpatient doctors.  Have advised her to check her blood pressure once daily to monitor her blood pressure for the next several weeks.   Have advised her not to check her blood pressure repeatedly as this can lead to a vicious cycle of her blood pressure continuing to rise which is what I think happened tonight and causing her anxiety to increase.  She agrees.  Have advised her to purchase an arm cuff rather than using her wrist cuff.  Her labs today are normal, chest x-ray clear, EKG normal.  She does have trace hemoglobinuria which she has had previously.  No proteinuria.  I feel she is safe to be discharged home.  She is comfortable with this plan and appears less anxious.   At this time, I do not feel there is any life-threatening condition present. I have reviewed and discussed all results (EKG, imaging, lab, urine as appropriate) and exam findings with patient/family. I have reviewed nursing notes and appropriate previous records.  I feel the patient is safe to be discharged home without further emergent workup and can continue workup as an outpatient as needed. Discussed  usual and customary return precautions. Patient/family verbalize understanding and are comfortable with this plan.  Outpatient follow-up has been provided as needed. All questions have been answered.    EKG Interpretation  Date/Time:  Monday November 18 2018 03:10:38 EST Ventricular Rate:  80 PR Interval:    QRS Duration: 93 QT Interval:  369 QTC Calculation: 426 R Axis:   -15 Text Interpretation:  Sinus rhythm Borderline left axis deviation No old tracing to compare Confirmed by Karter Haire, Baxter Hire [phone]) on 11/18/2018 3:23:43 AM       .   Raelyn Number, DO 11/18/18 6045

## 2018-11-18 NOTE — Telephone Encounter (Signed)
telephone call, finished current pack of pills yesterday will start on Micronor, E scribed, reviewed no placebo week may not get a cycle.  Reviewed importance of low-sodium diet.

## 2018-11-18 NOTE — Telephone Encounter (Signed)
Patient called asking if birth control pills should be switched? states several episodes of elevated blood pressure. She went to [facility] late last night due to this, notes in epic. She has not checked blood pressure at home, has been sleep for most of today. Please advise

## 2018-11-18 NOTE — ED Notes (Signed)
Pt and family understood dc material. NAD noted. All questions answered to satisfaction. Pt and family escorted to check out counter. 

## 2018-11-20 ENCOUNTER — Encounter: Payer: Self-pay | Admitting: Family Medicine

## 2018-11-20 ENCOUNTER — Other Ambulatory Visit: Payer: Self-pay

## 2018-11-20 ENCOUNTER — Ambulatory Visit: Payer: No Typology Code available for payment source | Admitting: Family Medicine

## 2018-11-20 VITALS — BP 118/84 | HR 84 | Temp 97.9°F | Resp 14 | Ht 63.0 in | Wt 136.6 lb

## 2018-11-20 DIAGNOSIS — F419 Anxiety disorder, unspecified: Secondary | ICD-10-CM

## 2018-11-20 DIAGNOSIS — R030 Elevated blood-pressure reading, without diagnosis of hypertension: Secondary | ICD-10-CM | POA: Diagnosis not present

## 2018-11-20 DIAGNOSIS — Z8249 Family history of ischemic heart disease and other diseases of the circulatory system: Secondary | ICD-10-CM

## 2018-11-20 DIAGNOSIS — R002 Palpitations: Secondary | ICD-10-CM

## 2018-11-20 HISTORY — DX: Family history of ischemic heart disease and other diseases of the circulatory system: Z82.49

## 2018-11-20 NOTE — Patient Instructions (Signed)
Please return in 3-6 months for your annual complete physical; please come fasting.  Please check your blood pressure intermittently. Normal is < 140/90. Keep eating low salt diet.   IF you get more palpitations, call me for an office visit. Check your pulse during the palpitations.   It was a pleasure meeting you today! Thank you for choosing [facility] to meet your healthcare needs! I truly look forward to working with you. If you have any questions or concerns, please send me a message via Mychart or call the office at [phone].

## 2018-11-20 NOTE — Progress Notes (Signed)
Subjective  CC:  Chief Complaint  Patient presents with  . Establish Care    Went to ER on 3/1 for elevated BP    HPI: [name] is a 48 y.o. female who presents to [facility] today to establish care with me as a new patient.   She has the following concerns or needs:  Very pleasant 48 year old divorced but in a steady monogamous relationship mother of 2 presents to establish care.  She has been getting annual physicals and routine care from her gynecologist.  I have reviewed notes from gynecology.  She has a history of a cervical dysplasia treated with LEEP in the distant past with normal follow-up.  Also has had some intermenstrual spotting status post endometrial biopsy and managed with oral contraception.  She has no chronic conditions.  She reports intermittent anxiety symptoms that have been rarely treated with Xanax.  Last use was 3 years ago.  No history of mood disorder.  Follow-up ER visit: She had been seen by urgent care and treated for sinus infection.  He had been feeling well and took her blood pressure.  Was very elevated.  Emergency room evaluation revealed a normal BMP, CBC and EKG.  Blood pressures were elevated but patient was very anxious.  She also had been using decongestants.  That was just 2 days ago.  Since then she has had a few normal blood pressure readings.  She has no history of hypertension.  By chart review, blood pressures are always in the normal range, averaging 120/70.  She does have a family history of hypertension and premature cardiovascular disease, her mother died in her 42s.  She denies chest pain, shortness of breath, dyspnea on exertion or fatigue.  She did have a few palpitations during the course of her illness.  None the last 24 hours.  No history of thyroid problems.  No hyperlipidemia or diabetic history.  Health maintenance: She is up-to-date on mammogram and Pap smear.  She declines a flu shot.  She is due  for complete physical with lab work at her convenience.  Assessment  1. Elevated blood pressure reading without diagnosis of hypertension   2. Anxiety   3. Family history of premature CAD   4. Palpitations      Plan   Elevated blood pressure in the setting of decongestants and acute illness: Multifactorial in nature and exacerbated by anxiety, reassured.  Patient will monitor her blood pressures at home and return if persistent elevations develop.  She will continue low-sodium diet.  No further work-up indicated now.  Rare anxiety, PRN Xanax  Palpitations: Patient to return if palpitations return.  She will check her pulse for heart rate and/or irregularity.  She has a normal heart exam now.  She will return for complete physical and we will check her lab work to ensure no anemia or thyroid problems are coexisting.  Follow up: 3 to 6 months for complete physical No orders of the defined types were placed in this encounter.  No orders of the defined types were placed in this encounter.    Depression screen PHQ 2/9 11/20/2018  Decreased Interest 0  Down, Depressed, Hopeless 0  PHQ - 2 Score 0    We updated and reviewed the patient's past history in detail and it is documented below.  Patient Active Problem List   Diagnosis Date Noted  . Essential hypertension 11/20/2018  . Family history of premature CAD 11/20/2018  Mom   . Allergic rhinitis 12/17/2012  . Anxiety 06/27/2012    Rare xanax use    Health Maintenance  Topic Date Due  . INFLUENZA VACCINE  12/17/2018 (Originally 04/18/2018)  . PAP SMEAR-Modifier  10/09/2023  . TETANUS/TDAP  09/05/2027  . HIV Screening  Completed   Immunization History  Administered Date(s) Administered  . Tdap 09/04/2017   Current Meds  Medication Sig  . ALPRAZolam (XANAX) 0.25 MG tablet Take 1 tablet (0.25 mg total) by mouth at bedtime as needed for anxiety.  . norethindrone (ORTHO MICRONOR) 0.35 MG tablet Take 1 tablet (0.35 mg total)  by mouth daily.    Allergies: Patient has No Known Allergies. Past Medical History Patient  has a past medical history of Anxiety, CIN I (cervical intraepithelial neoplasia I), Depression, Family history of premature CAD (11/20/2018), and H/O gestational diabetes mellitus, not currently pregnant. Past Surgical History Patient  has a past surgical history that includes Cervical biopsy w/ loop electrode excision (1999) and Augmentation mammaplasty (2005). Family History: Patient family history includes Cancer in her father; Cerebral aneurysm in her maternal grandfather; Diabetes in her brother and mother; Epilepsy in her son; Healthy in her daughter; Heart disease in her maternal grandmother, mother, paternal grandfather, and paternal grandmother; Hyperlipidemia in her mother; Hypertension in her mother and paternal grandmother; Learning disabilities in her maternal aunt; Lung cancer in her mother; Prostate cancer in her paternal grandfather; Renal cancer in her father. Social History:  Patient  reports that she has never smoked. She has never used smokeless tobacco. She reports current alcohol use. She reports that she does not use drugs.  Review of Systems: Constitutional: negative for fever or malaise Ophthalmic: negative for photophobia, double vision or loss of vision Cardiovascular: negative for chest pain, dyspnea on exertion, or new LE swelling Respiratory: negative for SOB or persistent cough Gastrointestinal: negative for abdominal pain, change in bowel habits or melena Genitourinary: negative for dysuria or gross hematuria Musculoskeletal: negative for new gait disturbance or muscular weakness Integumentary: negative for new or persistent rashes Neurological: negative for TIA or stroke symptoms Psychiatric: negative for SI or delusions Allergic/Immunologic: negative for hives  Patient Care Team    Relationship Specialty Notifications Start End  Willow Ora, MD PCP - General  Family Medicine  11/20/18   Harrington Challenger, NP Nurse Practitioner Obstetrics and Gynecology  11/20/18   Mammography, [facility]  Diagnostic Radiology  11/20/18     Objective  Vitals: BP 118/84   Pulse 84   Temp 97.9 F (36.6 C) (Oral)   Resp 14   Ht  (1.6 m)   Wt 136 lb 9.6 oz (62 kg)   LMP 11/11/2018   SpO2 99%   BMI 24.20 kg/m  General:  Well developed, well nourished, no acute distress  Psych:  Alert and oriented,normal mood and affect HEENT:  Normocephalic, atraumatic, non-icteric sclera, PERRL, oropharynx is without mass or exudate, supple neck without adenopathy, mass or thyromegaly Cardiovascular:  RRR without gallop, rub or murmur, nondisplaced PMI Respiratory:  Good breath sounds bilaterally, CTAB with normal respiratory effort MSK: no deformities, contusions.  Skin:  Warm, no rashes or suspicious lesions noted Neurologic:    Mental status is normal.  Normal gait, no tremor  No visits with results within 1 Day(s) from this visit.  Latest known visit with results is:  Admission on 11/18/2018, Discharged on 11/18/2018  Component Date Value Ref Range Status  . WBC 11/18/2018 5.7  4.0 - 10.5 K/uL Final  .  RBC 11/18/2018 4.27  3.87 - 5.11 MIL/uL Final  . Hemoglobin 11/18/2018 13.0  12.0 - 15.0 g/dL Final  . HCT 79/10/4095 39.3  36.0 - 46.0 % Final  . MCV 11/18/2018 92.0  80.0 - 100.0 fL Final  . MCH 11/18/2018 30.4  26.0 - 34.0 pg Final  . MCHC 11/18/2018 33.1  30.0 - 36.0 g/dL Final  . RDW 35/32/9924 13.4  11.5 - 15.5 % Final  . Platelets 11/18/2018 306  150 - 400 K/uL Final  . nRBC 11/18/2018 0.0  0.0 - 0.2 % Final  . Neutrophils Relative % 11/18/2018 57  % Final  . Neutro Abs 11/18/2018 3.3  1.7 - 7.7 K/uL Final  . Lymphocytes Relative 11/18/2018 31  % Final  . Lymphs Abs 11/18/2018 1.8  0.7 - 4.0 K/uL Final  . Monocytes Relative 11/18/2018 8  % Final  . Monocytes Absolute 11/18/2018 0.5  0.1 - 1.0 K/uL Final  . Eosinophils Relative 11/18/2018 3  % Final  . Eosinophils  Absolute 11/18/2018 0.2  0.0 - 0.5 K/uL Final  . Basophils Relative 11/18/2018 1  % Final  . Basophils Absolute 11/18/2018 0.0  0.0 - 0.1 K/uL Final  . Immature Granulocytes 11/18/2018 0  % Final  . Abs Immature Granulocytes 11/18/2018 0.01  0.00 - 0.07 K/uL Final  . Sodium 11/18/2018 135  135 - 145 mmol/L Final  . Potassium 11/18/2018 3.5  3.5 - 5.1 mmol/L Final  . Chloride 11/18/2018 105  98 - 111 mmol/L Final  . CO2 11/18/2018 22  22 - 32 mmol/L Final  . Glucose, Bld 11/18/2018 106* 70 - 99 mg/dL Final  . BUN 26/83/4196 12  6 - 20 mg/dL Final  . Creatinine, Ser 11/18/2018 0.53  0.44 - 1.00 mg/dL Final  . Calcium 22/29/7989 8.7* 8.9 - 10.3 mg/dL Final  . GFR calc non Af Amer 11/18/2018 >60  >60 mL/min Final  . GFR calc Af Amer 11/18/2018 >60  >60 mL/min Final  . Anion gap 11/18/2018 8  5 - 15 Final  . Troponin I 11/18/2018 <0.03  <0.03 ng/mL Final  . Preg Test, Ur 11/18/2018 NEGATIVE  NEGATIVE Final  . Color, Urine 11/18/2018 YELLOW  YELLOW Final  . APPearance 11/18/2018 HAZY* CLEAR Final  . Specific Gravity, Urine 11/18/2018 1.025  1.005 - 1.030 Final  . pH 11/18/2018 6.0  5.0 - 8.0 Final  . Glucose, UA 11/18/2018 NEGATIVE  NEGATIVE mg/dL Final  . Hgb urine dipstick 11/18/2018 TRACE* NEGATIVE Final  . Bilirubin Urine 11/18/2018 NEGATIVE  NEGATIVE Final  . Ketones, ur 11/18/2018 NEGATIVE  NEGATIVE mg/dL Final  . Protein, ur 21/19/4174 NEGATIVE  NEGATIVE mg/dL Final  . Nitrite 05/01/4817 NEGATIVE  NEGATIVE Final  . Glori Luis 11/18/2018 NEGATIVE  NEGATIVE Final  . RBC / HPF 11/18/2018 0-5  0 - 5 RBC/hpf Final  . WBC, UA 11/18/2018 0-5  0 - 5 WBC/hpf Final  . Bacteria, UA 11/18/2018 FEW* NONE SEEN Final  . Squamous Epithelial / LPF 11/18/2018 6-10  0 - 5 Final  . Mucus 11/18/2018 PRESENT   Final  . Hyaline Casts, UA 11/18/2018 PRESENT   Final     Commons side effects, risks, benefits, and alternatives for medications and treatment plan prescribed today were discussed, and the  patient expressed understanding of the given instructions. Patient is instructed to call or message via MyChart if he/she has any questions or concerns regarding our treatment plan. No barriers to understanding were identified. We discussed Red Flag symptoms and signs  in detail. Patient expressed understanding regarding what to do in case of urgent or emergency type symptoms.   Medication list was reconciled, printed and provided to the patient in AVS. Patient instructions and summary information was reviewed with the patient as documented in the AVS. This note was prepared with assistance of Dragon voice recognition software. Occasional wrong-word or sound-a-like substitutions may have occurred due to the inherent limitations of voice recognition software

## 2018-11-25 ENCOUNTER — Encounter: Payer: Self-pay | Admitting: Family Medicine

## 2018-11-25 ENCOUNTER — Ambulatory Visit: Payer: PRIVATE HEALTH INSURANCE | Admitting: Family Medicine

## 2018-11-25 ENCOUNTER — Other Ambulatory Visit: Payer: Self-pay

## 2018-11-25 ENCOUNTER — Ambulatory Visit (INDEPENDENT_AMBULATORY_CARE_PROVIDER_SITE_OTHER): Payer: No Typology Code available for payment source | Admitting: Family Medicine

## 2018-11-25 VITALS — BP 128/82 | HR 100 | Temp 98.3°F | Resp 14 | Ht 63.0 in | Wt 136.0 lb

## 2018-11-25 DIAGNOSIS — J0100 Acute maxillary sinusitis, unspecified: Secondary | ICD-10-CM

## 2018-11-25 DIAGNOSIS — H9313 Tinnitus, bilateral: Secondary | ICD-10-CM

## 2018-11-25 MED ORDER — AMOXICILLIN-POT CLAVULANATE 875-125 MG PO TABS: 1.0000 | ORAL_TABLET | Freq: Two times a day (BID) | ORAL | 0 refills | Status: DC

## 2018-11-25 NOTE — Patient Instructions (Signed)
Please follow up if symptoms do not improve or as needed.   Take the antibiotics and mucinex twice a day for the next week. Let me know if the ringing does not improve.    Tinnitus Tinnitus refers to hearing a sound when there is no actual source for that sound. This is often described as ringing in the ears. However, people with this condition may hear a variety of noises, in one ear or in both ears. The sounds of tinnitus can be soft, loud, or somewhere in between. Tinnitus can last for a few seconds or can be constant for days. It may go away without treatment and come back at various times. When tinnitus is constant or happens often, it can lead to other problems, such as trouble sleeping and trouble concentrating. Almost everyone experiences tinnitus at some point. Tinnitus that is long-lasting (chronic) or comes back often (recurs) may require medical attention. What are the causes? The cause of tinnitus is often not known. In some cases, it can result from other problems or conditions, including:  Exposure to loud noises from machinery, music, or other sources.  Hearing loss.  Ear or sinus infections.  Earwax buildup.  An object (foreign body) stuck in the ear.  Taking certain medicines.  Drinking alcohol or caffeine.  High blood pressure.  Heart diseases.  Anemia.  Allergies.  Meniere's disease.  Thyroid problems.  Tumors.  A weak, bulging blood vessel (aneurysm) near the ear.  Depression or other mood disorders. What are the signs or symptoms? The main symptom of tinnitus is hearing a sound when there is no source for that sound. It may sound like:  Buzzing.  Roaring.  Ringing.  Blowing air, like the sound heard when you listen to a seashell.  Hissing.  Whistling.  Sizzling.  Humming.  Running water.  A musical note.  Tapping. Symptoms may affect only one ear (unilateral) or both ears (bilateral). How is this diagnosed? Tinnitus is  diagnosed based on your symptoms, your medical history, and a physical exam. Your health care provider may do a thorough hearing test (audiologic exam) if your tinnitus:  Is unilateral.  Causes hearing difficulties.  Lasts 6 months or longer. You may work with a health care provider who specializes in hearing disorders (audiologist). You may be asked questions about your symptoms and how they affect your daily life. You may have other tests done, such as:  CT scan.  MRI.  An imaging test of how blood flows through your blood vessels (angiogram). How is this treated? Treating an underlying medical condition can sometimes make tinnitus go away. If your tinnitus continues, other treatments may include:  Medicines, such as antidepressants or sleeping aids.  Sound generators to mask the tinnitus. These include: ? Tabletop sound machines that play relaxing sounds to help you fall asleep. ? Wearable devices that fit in your ear and play sounds or music. ? Acoustic neural stimulation. This involves using headphones to listen to music that contains an auditory signal. Over time, listening to this signal may change some pathways in your brain and make you less sensitive to tinnitus. This treatment is used for very severe cases when no other treatment is working.  Therapy and counseling to help you manage the stress of living with tinnitus.  Using hearing aids or cochlear implants if your tinnitus is related to hearing loss. Hearing aids are worn in the outer ear. Cochlear implants are surgically placed in the inner ear. Follow these instructions at  home: Managing symptoms      When possible, avoid being in loud places and being exposed to loud sounds.  Wear hearing protection, such as earplugs, when you are exposed to loud noises.  Use a white noise machine, a humidifier, or other devices to mask the sound of tinnitus.  Practice techniques for reducing stress, such as meditation, yoga,  or deep breathing. Work with your health care provider if you need help with managing stress.  Sleep with your head slightly raised. This may reduce the impact of tinnitus. General instructions  Do not use stimulants, such as nicotine, alcohol, or caffeine. Talk with your health care provider about other stimulants to avoid. Stimulants are substances that can make you feel alert and attentive by increasing certain activities in the body (such as heart rate and blood pressure). These substances may make tinnitus worse.  Take over-the-counter and prescription medicines only as told by your health care provider.  Try to get plenty of sleep each night.  Keep all follow-up visits as told by your health care provider. This is important. Contact a health care provider if:  Your tinnitus continues for 3 weeks or longer without stopping.  Your symptoms get worse or do not get better with home care.  You develop tinnitus after a head injury.  You have tinnitus along with any of the following: ? Dizziness. ? Loss of balance. ? Nausea and vomiting. Summary  Tinnitus refers to hearing a sound when there is no actual source for that sound. This is often described as ringing in the ears.  Symptoms may affect only one ear (unilateral) or both ears (bilateral).  Use a white noise machine, a humidifier, or other devices to mask the sound of tinnitus.  Do not use stimulants, such as nicotine, alcohol, or caffeine. Talk with your health care provider about other stimulants to avoid. These substances may make tinnitus worse. This information is not intended to replace advice given to you by your health care provider. Make sure you discuss any questions you have with your health care provider. Document Released: 09/04/2005 Document Revised: 06/14/2017 Document Reviewed: 06/14/2017 Elsevier Interactive Patient Education  2019 [employer].

## 2018-11-25 NOTE — Progress Notes (Signed)
Subjective   CC:  Chief Complaint  Patient presents with  . Rinning in ears    Started around 11/21/18, denies headaches, dizziness, and nausea.. Constant and seems louder at night    HPI: [name] is a 48 y.o. female who presents to the office today to address the problems listed above in the chief complaint.  Patient reports a several day history of ringing in both ears.  She does endorse sinus congestion, postnasal drainage and a tightness in her sinuses with some pressure she otherwise feels okay.  No fevers or cough.  No shortness of breath.  She denies recent ear trauma.  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  Possible balance problems.  Pressure readings have remained normal.  I reviewed the patients updated PMH, FH, and SocHx.    Patient Active Problem List   Diagnosis Date Noted  . Family history of premature CAD 11/20/2018  . Allergic rhinitis 12/17/2012  . Anxiety 06/27/2012   No outpatient medications have been marked as taking for the 11/25/18 encounter (Office Visit) with Willow Ora, MD.    Review of Systems: Cardiovascular: negative for chest pain Respiratory: negative for SOB or persistent cough Gastrointestinal: negative for abdominal pain Genitourinary: negative for dysuria or gross hematuria  Objective  Vitals: BP 128/82   Pulse 100   Temp 98.3 F (36.8 C) (Oral)   Resp 14   Ht 5\' 3"  (1.6 m)   Wt 136 lb (61.7 kg)   LMP 11/11/2018   SpO2 99%   BMI 24.09 kg/m  General: no acute distress  Psych:  Alert and oriented, normal mood and affect HEENT:  Normocephalic, atraumatic, TMs with serous effusions or retraction w/o erythema, nasal mucosa is red with purulent drainage, tender maxillary sinus present, OP mild erythematous w/o eudate, supple neck with LAD Cardiovascular:  RRR without murmur or gallop. no peripheral edema Respiratory:  Good breath sounds bilaterally, CTAB with normal respiratory  effort Skin:  Warm, no rashes Neurologic:   Mental status is normal. normal gait  Assessment  1. Acute non-recurrent maxillary sinusitis   2. Tinnitus of both ears      Plan    Sinusitis: History and exam is most consistent with bacterial sinus infection.  Etiology and prognosis discussed with patient.  Recommend antibiotics as ordered below.  Patient to complete course of antibiotics, use supportive medications like mucolytics and decongestants as needed.  May use Tylenol or Advil if needed.  Symptoms should improve over the next 2 weeks.  Patient will return or call if symptoms persist or worsen.  Tinnitus.  Will treat infection.  She will follow-up if things do not.  Hearing screen today was normal on right but decreased in higher frequency on left. Will recheck in future if sxs persist.   Follow up: As needed   Commons side effects, risks, benefits, and alternatives for medications and treatment plan prescribed today were discussed, and the patient expressed understanding of the given instructions. Patient is instructed to call or message via MyChart if he/she has any questions or concerns regarding our treatment plan. No barriers to understanding were identified. We discussed Red Flag symptoms and signs in detail. Patient expressed understanding regarding what to do in case of urgent or emergency type symptoms.   Medication list was reconciled, printed and provided to the patient in AVS. Patient instructions and summary information was reviewed with the patient as documented in the AVS.  This note was prepared with assistance of Conservation officer, historic buildings. Occasional wrong-word or sound-a-like substitutions may have occurred due to the inherent limitations of voice recognition software  No orders of the defined types were placed in this encounter.  Meds ordered this encounter  Medications  . amoxicillin-clavulanate (AUGMENTIN) 875-125 MG tablet    Sig: Take 1 tablet by mouth 2  (two) times daily.    Dispense:  14 tablet    Refill:  0

## 2018-12-09 ENCOUNTER — Other Ambulatory Visit: Payer: Self-pay | Admitting: Women's Health

## 2018-12-09 ENCOUNTER — Telehealth: Payer: Self-pay | Admitting: *Deleted

## 2018-12-09 MED ORDER — LEVONORGESTREL-ETHINYL ESTRAD 0.1-20 MG-MCG PO TABS: 1.0000 | ORAL_TABLET | Freq: Every day | ORAL | 3 refills | Status: DC

## 2018-12-09 NOTE — Telephone Encounter (Signed)
TC states has had lots of irregular bleeding with heavy flow on the Micronor, has had normal blood pressures at primary care and at home, would like to try combination pill again.  Will try Alesse, refill sent  will start today and monitor blood pressure at home if elevates greater than 130/80 will call.  Aware of importance of monitoring blood pressure.

## 2018-12-09 NOTE — Telephone Encounter (Signed)
Patient called c/o 2 weeks of bleeding with ortho Micronor 035 mg tablet, started pills this month wearing tampon changing every 3-4 hours. Did follow up with PCP regarding blood pressure and BP readings are much better,PCP did not think elevated blood pressure related to pills, not taking any medication at this time, PCP monitoring readings. She called asking to switch pills, I explained not abnormal to have irregular bleeding when switching BCP and recommendations would be try for 3 months. I told her I would run all this by you and get back with her. Please advise

## 2018-12-10 ENCOUNTER — Encounter: Payer: Self-pay | Admitting: Family Medicine

## 2018-12-18 ENCOUNTER — Telehealth: Payer: Self-pay | Admitting: Family Medicine

## 2018-12-18 NOTE — Telephone Encounter (Signed)
Copied from CRM [id number]. Topic: Quick Communication - See Telephone Encounter >> Dec 18, 2018  3:23 PM Lorayne Bender wrote: CRM for notification. See Telephone encounter for: 12/18/18.  See MyChart message from 12/10/2018. Pt called and left message on [facility] mailbox.  States that she continues to have ringing in the ears and it is causing headaches and she would like to know what to do about that at this time. Pt can be reached at [phone].

## 2018-12-18 NOTE — Telephone Encounter (Signed)
Please advise, I know you mentioned ENT at last visit, but due to COVID-19, unable to make appt.

## 2018-12-18 NOTE — Telephone Encounter (Signed)
Can advise her that we can refer to ENT to see if they are seeing patients at this time.  May use ibuprofen for headaches.

## 2018-12-19 ENCOUNTER — Other Ambulatory Visit: Payer: Self-pay | Admitting: *Deleted

## 2018-12-19 DIAGNOSIS — H9313 Tinnitus, bilateral: Secondary | ICD-10-CM

## 2018-12-19 NOTE — Telephone Encounter (Signed)
Spoke with pt she is aware ENT referral is being made and to use IBU for headaches as needed

## 2018-12-25 ENCOUNTER — Encounter: Payer: Self-pay | Admitting: Family Medicine

## 2019-01-27 ENCOUNTER — Encounter: Payer: Self-pay | Admitting: Family Medicine

## 2019-02-17 ENCOUNTER — Other Ambulatory Visit: Payer: Self-pay

## 2019-02-17 ENCOUNTER — Ambulatory Visit (INDEPENDENT_AMBULATORY_CARE_PROVIDER_SITE_OTHER): Payer: No Typology Code available for payment source | Admitting: Otolaryngology

## 2019-02-17 DIAGNOSIS — H9313 Tinnitus, bilateral: Secondary | ICD-10-CM

## 2019-02-17 DIAGNOSIS — H9072 Mixed conductive and sensorineural hearing loss, unilateral, left ear, with unrestricted hearing on the contralateral side: Secondary | ICD-10-CM

## 2019-02-17 DIAGNOSIS — H903 Sensorineural hearing loss, bilateral: Secondary | ICD-10-CM | POA: Diagnosis not present

## 2019-02-26 ENCOUNTER — Other Ambulatory Visit: Payer: Self-pay

## 2019-02-26 ENCOUNTER — Encounter: Payer: Self-pay | Admitting: Family Medicine

## 2019-02-26 ENCOUNTER — Ambulatory Visit (INDEPENDENT_AMBULATORY_CARE_PROVIDER_SITE_OTHER): Payer: No Typology Code available for payment source | Admitting: Family Medicine

## 2019-02-26 VITALS — BP 145/95 | HR 84 | Temp 98.2°F | Ht 63.0 in | Wt 135.4 lb

## 2019-02-26 DIAGNOSIS — Z8249 Family history of ischemic heart disease and other diseases of the circulatory system: Secondary | ICD-10-CM | POA: Diagnosis not present

## 2019-02-26 DIAGNOSIS — R030 Elevated blood-pressure reading, without diagnosis of hypertension: Secondary | ICD-10-CM

## 2019-02-26 DIAGNOSIS — Z0000 Encounter for general adult medical examination without abnormal findings: Secondary | ICD-10-CM

## 2019-02-26 LAB — CBC WITH DIFFERENTIAL/PLATELET
Basophils Absolute: 0 10*3/uL (ref 0.0–0.1)
Basophils Relative: 0.5 % (ref 0.0–3.0)
Eosinophils Absolute: 0.2 10*3/uL (ref 0.0–0.7)
Eosinophils Relative: 3 % (ref 0.0–5.0)
HCT: 39.5 % (ref 36.0–46.0)
Hemoglobin: 13.2 g/dL (ref 12.0–15.0)
Lymphocytes Relative: 30.1 % (ref 12.0–46.0)
Lymphs Abs: 1.6 10*3/uL (ref 0.7–4.0)
MCHC: 33.3 g/dL (ref 30.0–36.0)
MCV: 93.4 fl (ref 78.0–100.0)
Monocytes Absolute: 0.4 10*3/uL (ref 0.1–1.0)
Monocytes Relative: 6.9 % (ref 3.0–12.0)
Neutro Abs: 3.3 10*3/uL (ref 1.4–7.7)
Neutrophils Relative %: 59.5 % (ref 43.0–77.0)
Platelets: 294 10*3/uL (ref 150.0–400.0)
RBC: 4.23 Mil/uL (ref 3.87–5.11)
RDW: 14.3 % (ref 11.5–15.5)
WBC: 5.5 10*3/uL (ref 4.0–10.5)

## 2019-02-26 LAB — COMPREHENSIVE METABOLIC PANEL
ALT: 10 U/L (ref 0–35)
AST: 12 U/L (ref 0–37)
Albumin: 4.2 g/dL (ref 3.5–5.2)
Alkaline Phosphatase: 35 U/L — ABNORMAL LOW (ref 39–117)
BUN: 8 mg/dL (ref 6–23)
CO2: 24 mEq/L (ref 19–32)
Calcium: 9.2 mg/dL (ref 8.4–10.5)
Chloride: 104 mEq/L (ref 96–112)
Creatinine, Ser: 0.67 mg/dL (ref 0.40–1.20)
GFR: 93.38 mL/min (ref >60.00)
Glucose, Bld: 94 mg/dL (ref 70–99)
Potassium: 4.3 mEq/L (ref 3.5–5.1)
Sodium: 136 mEq/L (ref 135–145)
Total Bilirubin: 0.6 mg/dL (ref 0.2–1.2)
Total Protein: 6.5 g/dL (ref 6.0–8.3)

## 2019-02-26 LAB — TSH: TSH: 2.28 u[IU]/mL (ref 0.35–4.50)

## 2019-02-26 LAB — LIPID PANEL
Cholesterol: 158 mg/dL (ref 0–200)
HDL: 46.1 mg/dL (ref >39.00)
LDL Cholesterol: 91 mg/dL (ref 0–99)
NonHDL: 111.76
Total CHOL/HDL Ratio: 3
Triglycerides: 106 mg/dL (ref 0.0–149.0)
VLDL: 21.2 mg/dL (ref 0.0–40.0)

## 2019-02-26 NOTE — Progress Notes (Signed)
Subjective  Chief Complaint  Patient presents with   Annual Exam    waking up in the middle of the night 3-4x night.    Home Blood pressure    Reading average 130/80    HPI: [name] is a 48 y.o. female who presents to [facility] [place] today for a Female Wellness Visit. She also has the concerns and/or needs as listed above in the chief complaint. These will be addressed in addition to the Health Maintenance Visit.   Wellness Visit: annual visit with health maintenance review and exam without Pap   HM: pap is up to date. mammo is up to date. Healthy lifestyle overall but could get back to exercising regularly and clean up the diet a little. Eats out a lot. Chronic disease f/u and/or acute problem visit: (deemed necessary to be done in addition to the wellness visit):  Elevated bps' see readings above. Several are out of range but she reports she gets anxious when she takes it. Many are ok. Definitely has anxiety component. No cp,sob, LE edema.   Anxiety: more uptight lately with mild sleep disturbance - no h/o mood disorder.  Minimally bothersome.   Assessment  1. Annual physical exam   2. Elevated blood pressure reading without diagnosis of hypertension   3. Family history of premature CAD   4. White coat syndrome without diagnosis of hypertension      Plan  Female Wellness Visit:  Age appropriate Health Maintenance and Prevention measures were discussed with patient. Included topics are cancer screening recommendations, ways to keep healthy (see AVS) including dietary and exercise recommendations, regular eye and dental care, use of seat belts, and avoidance of moderate alcohol use and tobacco use.   BMI: discussed patient's BMI and encouraged positive lifestyle modifications to help get to or maintain a target BMI.  HM needs and immunizations were addressed and ordered. See below for orders. See HM and immunization section for  updates.  Routine labs and screening tests ordered including cmp, cbc and lipids where appropriate.  Discussed recommendations regarding Vit D and calcium supplementation (see AVS)  Chronic disease management visit and/or acute problem visit:  BP: counseled and educated. rec low sodium diet, mild weight loss and stress reduction. Will return in 3months with more BP readings. If elevations persist, I favor starting medications. Patient understands and agrees with care plan.   Check labs.   Follow up: Return in about 3 months (around 05/29/2019) for recheck blood pressure.  Orders Placed This Encounter  Procedures   CBC with Differential/Platelet   Comprehensive metabolic panel   Lipid panel   HIV Antibody (routine testing w rflx)   TSH   No orders of the defined types were placed in this encounter.     Lifestyle: Body mass index is 23.99 kg/m. Wt Readings from Last 3 Encounters:  02/26/19 135 lb 6.4 oz (61.4 kg)  11/25/18 136 lb (61.7 kg)  11/20/18 136 lb 9.6 oz (62 kg)     Patient Active Problem List   Diagnosis Date Noted   White coat syndrome without diagnosis of hypertension 02/26/2019   Family history of premature CAD 11/20/2018    Mom    Allergic rhinitis 12/17/2012   Anxiety 06/27/2012    Rare xanax use    Health Maintenance  Topic Date Due   INFLUENZA VACCINE  04/19/2019   PAP SMEAR-Modifier  10/09/2023   TETANUS/TDAP  09/05/2027   HIV Screening  Completed  Immunization History  Administered Date(s) Administered   Tdap 09/04/2017   We updated and reviewed the patient's past history in detail and it is documented below. Allergies: Patient has No Known Allergies. Past Medical History Patient  has a past medical history of Anxiety, CIN I (cervical intraepithelial neoplasia I), Depression, Family history of premature CAD (11/20/2018), and H/O gestational diabetes mellitus, not currently pregnant. Past Surgical History Patient  has a past  surgical history that includes Cervical biopsy w/ loop electrode excision (1999) and Augmentation mammaplasty (2005). Family History: Patient family history includes Cancer in her father; Cerebral aneurysm in her maternal grandfather; Diabetes in her brother and mother; Epilepsy in her son; Healthy in her daughter; Heart disease in her maternal grandmother, mother, paternal grandfather, and paternal grandmother; Hyperlipidemia in her mother; Hypertension in her mother and paternal grandmother; Learning disabilities in her maternal aunt; Lung cancer in her mother; Prostate cancer in her paternal grandfather; Renal cancer in her father. Social History:  Patient  reports that she has never smoked. She has never used smokeless tobacco. She reports current alcohol use. She reports that she does not use drugs.  Review of Systems: Constitutional: negative for fever or malaise Ophthalmic: negative for photophobia, double vision or loss of vision Cardiovascular: negative for chest pain, dyspnea on exertion, or new LE swelling Respiratory: negative for SOB or persistent cough Gastrointestinal: negative for abdominal pain, change in bowel habits or melena Genitourinary: negative for dysuria or gross hematuria, no abnormal uterine bleeding or disharge Musculoskeletal: negative for new gait disturbance or muscular weakness Integumentary: negative for new or persistent rashes, no breast lumps Neurological: negative for TIA or stroke symptoms Psychiatric: negative for SI or delusions Allergic/Immunologic: negative for hives  Patient Care Team    Relationship Specialty Notifications Start End  Willow OraAndy, Sharol Croghan L, MD PCP - General Family Medicine  11/20/18   Harrington ChallengerYoung, Nancy J, NP Nurse Practitioner Obstetrics and Gynecology  11/20/18   Mammography, [facility]  Diagnostic Radiology  11/20/18     Objective  Vitals: BP (!) 145/95 (BP Location: Right Arm, Patient Position: Sitting, Cuff Size: Normal)    Pulse 84    Temp 98.2  F (36.8 C) (Oral)    Ht 5\' 3"  (1.6 m)    Wt 135 lb 6.4 oz (61.4 kg)    LMP 01/26/2019 (Exact Date)    SpO2 98%    BMI 23.99 kg/m  General:  Well developed, well nourished, no acute distress  Psych:  Alert and orientedx3,normal mood and affect HEENT:  Normocephalic, atraumatic, non-icteric sclera, PERRL, oropharynx is clear without mass or exudate, supple neck without adenopathy, mass or thyromegaly Cardiovascular:  Normal S1, S2, RRR without gallop, rub or murmur, nondisplaced PMI Respiratory:  Good breath sounds bilaterally, CTAB with normal respiratory effort Gastrointestinal: normal bowel sounds, soft, non-tender, no noted masses. No HSM MSK: no deformities, contusions. Joints are without erythema or swelling. Spine and CVA region are nontender Skin:  Warm, no rashes or suspicious lesions noted Neurologic:    Mental status is normal. CN 2-11 are normal. Gross motor and sensory exams are normal. Normal gait. No tremor     Commons side effects, risks, benefits, and alternatives for medications and treatment plan prescribed today were discussed, and the patient expressed understanding of the given instructions. Patient is instructed to call or message via MyChart if he/she has any questions or concerns regarding our treatment plan. No barriers to understanding were identified. We discussed Red Flag symptoms and signs in detail. Patient expressed  understanding regarding what to do in case of urgent or emergency type symptoms.   Medication list was reconciled, printed and provided to the patient in AVS. Patient instructions and summary information was reviewed with the patient as documented in the AVS. This note was prepared with assistance of Dragon voice recognition software. Occasional wrong-word or sound-a-like substitutions may have occurred due to the inherent limitations of voice recognition software

## 2019-02-26 NOTE — Patient Instructions (Addendum)
Please return in 3 months for blood pressure check.  Eat a clean diet and work on losing 3-5 pounds.  Start relaxation practices and a wind down routine prior to bedtime.  Limit stressors (TV news).   I will release your lab results to you on your MyChart account with further instructions. Please reply with any questions.   If you have any questions or concerns, please don't hesitate to send me a message via MyChart or call the office at [phone]. Thank you for visiting with [name] today! It's our pleasure caring for you.   Hypertension Hypertension, commonly called high blood pressure, is when the force of blood pumping through the arteries is too strong. The arteries are the blood vessels that carry blood from the heart throughout the body. Hypertension forces the heart to work harder to pump blood and may cause arteries to become narrow or stiff. Having untreated or uncontrolled hypertension can cause heart attacks, strokes, kidney disease, and other problems. A blood pressure reading consists of a higher number over a lower number. Ideally, your blood pressure should be below 120/80. The first ("top") number is called the systolic pressure. It is a measure of the pressure in your arteries as your heart beats. The second ("bottom") number is called the diastolic pressure. It is a measure of the pressure in your arteries as the heart relaxes. What are the causes? The cause of this condition is not known. What increases the risk? Some risk factors for high blood pressure are under your control. Others are not. Factors you can change  Smoking.  Having type 2 diabetes mellitus, high cholesterol, or both.  Not getting enough exercise or physical activity.  Being overweight.  Having too much fat, sugar, calories, or salt (sodium) in your diet.  Drinking too much alcohol. Factors that are difficult or impossible to change  Having chronic kidney disease.  Having a family history of high  blood pressure.  Age. Risk increases with age.  Race. You may be at higher risk if you are African-American.  Gender. Men are at higher risk than women before age 50. After age 165, women are at higher risk than men.  Having obstructive sleep apnea.  Stress. What are the signs or symptoms? Extremely high blood pressure (hypertensive crisis) may cause:  Headache.  Anxiety.  Shortness of breath.  Nosebleed.  Nausea and vomiting.  Severe chest pain.  Jerky movements you cannot control (seizures). How is this diagnosed? This condition is diagnosed by measuring your blood pressure while you are seated, with your arm resting on a surface. The cuff of the blood pressure monitor will be placed directly against the skin of your upper arm at the level of your heart. It should be measured at least twice using the same arm. Certain conditions can cause a difference in blood pressure between your right and left arms. Certain factors can cause blood pressure readings to be lower or higher than normal (elevated) for a short period of time:  When your blood pressure is higher when you are in a health care provider's office than when you are at home, this is called white coat hypertension. Most people with this condition do not need medicines.  When your blood pressure is higher at home than when you are in a health care provider's office, this is called masked hypertension. Most people with this condition may need medicines to control blood pressure. If you have a high blood pressure reading during one visit or you  have normal blood pressure with other risk factors:  You may be asked to return on a different day to have your blood pressure checked again.  You may be asked to monitor your blood pressure at home for 1 week or longer. If you are diagnosed with hypertension, you may have other blood or imaging tests to help your health care provider understand your overall risk for other  conditions. How is this treated? This condition is treated by making healthy lifestyle changes, such as eating healthy foods, exercising more, and reducing your alcohol intake. Your health care provider may prescribe medicine if lifestyle changes are not enough to get your blood pressure under control, and if:  Your systolic blood pressure is above 130.  Your diastolic blood pressure is above 80. Your personal target blood pressure may vary depending on your medical conditions, your age, and other factors. Follow these instructions at home: Eating and drinking   Eat a diet that is high in fiber and potassium, and low in sodium, added sugar, and fat. An example eating plan is called the DASH (Dietary Approaches to Stop Hypertension) diet. To eat this way: ? Eat plenty of fresh fruits and vegetables. Try to fill half of your plate at each meal with fruits and vegetables. ? Eat whole grains, such as whole wheat pasta, brown rice, or whole grain bread. Fill about one quarter of your plate with whole grains. ? Eat or drink low-fat dairy products, such as skim milk or low-fat yogurt. ? Avoid fatty cuts of meat, processed or cured meats, and poultry with skin. Fill about one quarter of your plate with lean proteins, such as fish, chicken without skin, beans, eggs, and tofu. ? Avoid premade and processed foods. These tend to be higher in sodium, added sugar, and fat.  Reduce your daily sodium intake. Most people with hypertension should eat less than 1,500 mg of sodium a day.  Limit alcohol intake to no more than 1 drink a day for nonpregnant women and 2 drinks a day for men. One drink equals 12 oz of beer, 5 oz of wine, or 1 oz of hard liquor. Lifestyle   Work with your health care provider to maintain a healthy body weight or to lose weight. Ask what an ideal weight is for you.  Get at least 30 minutes of exercise that causes your heart to beat faster (aerobic exercise) most days of the week.  Activities may include walking, swimming, or biking.  Include exercise to strengthen your muscles (resistance exercise), such as pilates or lifting weights, as part of your weekly exercise routine. Try to do these types of exercises for 30 minutes at least 3 days a week.  Do not use any products that contain nicotine or tobacco, such as cigarettes and e-cigarettes. If you need help quitting, ask your health care provider.  Monitor your blood pressure at home as told by your health care provider.  Keep all follow-up visits as told by your health care provider. This is important. Medicines  Take over-the-counter and prescription medicines only as told by your health care provider. Follow directions carefully. Blood pressure medicines must be taken as prescribed.  Do not skip doses of blood pressure medicine. Doing this puts you at risk for problems and can make the medicine less effective.  Ask your health care provider about side effects or reactions to medicines that you should watch for. Contact a health care provider if:  You think you are having a reaction  to a medicine you are taking.  You have headaches that keep coming back (recurring).  You feel dizzy.  You have swelling in your ankles.  You have trouble with your vision. Get help right away if:  You develop a severe headache or confusion.  You have unusual weakness or numbness.  You feel faint.  You have severe pain in your chest or abdomen.  You vomit repeatedly.  You have trouble breathing. Summary  Hypertension is when the force of blood pumping through your arteries is too strong. If this condition is not controlled, it may put you at risk for serious complications.  Your personal target blood pressure may vary depending on your medical conditions, your age, and other factors. For most people, a normal blood pressure is less than 120/80.  Hypertension is treated with lifestyle changes, medicines, or a  combination of both. Lifestyle changes include weight loss, eating a healthy, low-sodium diet, exercising more, and limiting alcohol. This information is not intended to replace advice given to you by your health care provider. Make sure you discuss any questions you have with your health care provider. Document Released: 09/04/2005 Document Revised: 08/02/2016 Document Reviewed: 08/02/2016 Elsevier Interactive Patient Education  2019 [employer]

## 2019-02-26 NOTE — Progress Notes (Signed)
Home blood pressure readings 3/7: 142/97 3/8: 130/87 3/11:128/83 3/15: 123/83 3/20:130/81 3/24: 133/86 3/25: 127/86 3/30: 126/77 4/2: 130/86 4/6: 116/76 4/21: 141/93 4/23: 145/95 4/24: 141/86 4/27:128/86 5/1: 128/81 5/13: 141/90 5/23: 146/94

## 2019-02-27 ENCOUNTER — Encounter: Payer: Self-pay | Admitting: Family Medicine

## 2019-02-27 LAB — HIV ANTIBODY (ROUTINE TESTING W REFLEX): HIV 1&2 Ab, 4th Generation: NONREACTIVE

## 2019-02-27 MED ORDER — METOPROLOL SUCCINATE ER 25 MG PO TB24: 25.0000 mg | ORAL_TABLET | Freq: Every day | ORAL | 3 refills | Status: DC

## 2019-04-04 ENCOUNTER — Encounter (HOSPITAL_COMMUNITY): Payer: Self-pay | Admitting: *Deleted

## 2019-04-04 ENCOUNTER — Other Ambulatory Visit: Payer: Self-pay

## 2019-04-04 ENCOUNTER — Emergency Department (HOSPITAL_COMMUNITY)
Admission: EM | Admit: 2019-04-04 | Discharge: 2019-04-04 | Disposition: A | Discharge status: Home / Self Care | Payer: PRIVATE HEALTH INSURANCE | Attending: Emergency Medicine | Admitting: Emergency Medicine

## 2019-04-04 ENCOUNTER — Emergency Department (HOSPITAL_COMMUNITY): Payer: PRIVATE HEALTH INSURANCE

## 2019-04-04 DIAGNOSIS — I10 Essential (primary) hypertension: Secondary | ICD-10-CM | POA: Diagnosis not present

## 2019-04-04 DIAGNOSIS — Z8632 Personal history of gestational diabetes: Secondary | ICD-10-CM | POA: Diagnosis not present

## 2019-04-04 DIAGNOSIS — G43009 Migraine without aura, not intractable, without status migrainosus: Secondary | ICD-10-CM | POA: Diagnosis not present

## 2019-04-04 DIAGNOSIS — R202 Paresthesia of skin: Secondary | ICD-10-CM | POA: Insufficient documentation

## 2019-04-04 HISTORY — DX: Essential (primary) hypertension: I10

## 2019-04-04 LAB — BASIC METABOLIC PANEL
Anion gap: 10 (ref 5–15)
BUN: 11 mg/dL (ref 6–20)
CO2: 22 mmol/L (ref 22–32)
Calcium: 9 mg/dL (ref 8.9–10.3)
Chloride: 106 mmol/L (ref 98–111)
Creatinine, Ser: 0.69 mg/dL (ref 0.44–1.00)
GFR calc Af Amer: >60 mL/min (ref >60)
GFR calc non Af Amer: >60 mL/min (ref >60)
Glucose, Bld: 110 mg/dL — ABNORMAL HIGH (ref 70–99)
Potassium: 3.7 mmol/L (ref 3.5–5.1)
Sodium: 138 mmol/L (ref 135–145)

## 2019-04-04 LAB — CBC
HCT: 36.6 % (ref 36.0–46.0)
Hemoglobin: 12.4 g/dL (ref 12.0–15.0)
MCH: 31.5 pg (ref 26.0–34.0)
MCHC: 33.9 g/dL (ref 30.0–36.0)
MCV: 92.9 fL (ref 80.0–100.0)
Platelets: 268 10*3/uL (ref 150–400)
RBC: 3.94 MIL/uL (ref 3.87–5.11)
RDW: 13.1 % (ref 11.5–15.5)
WBC: 5.4 10*3/uL (ref 4.0–10.5)
nRBC: 0 % (ref 0.0–0.2)

## 2019-04-04 LAB — CBG MONITORING, ED: Glucose-Capillary: 94 mg/dL (ref 70–99)

## 2019-04-04 MED ORDER — DIPHENHYDRAMINE HCL 50 MG/ML IJ SOLN: 25.0000 mg | Freq: Once | INTRAMUSCULAR | Status: DC

## 2019-04-04 MED ORDER — PROCHLORPERAZINE EDISYLATE 10 MG/2ML IJ SOLN: 5.0000 mg | Freq: Once | INTRAMUSCULAR | Status: DC

## 2019-04-04 MED ORDER — LORAZEPAM 2 MG/ML IJ SOLN
1.0000 mg | Freq: Once | INTRAMUSCULAR | Status: AC | PRN
  Administered 2019-04-04: 1 mg via INTRAVENOUS
  Filled 2019-04-04: qty 1

## 2019-04-04 NOTE — ED Notes (Signed)
1 mg Ativan wasted in sharps by Thurmond Butts RN with Ryland RN

## 2019-04-04 NOTE — ED Provider Notes (Signed)
[facility] Emergency Department Provider Note MRN:  [id number]  Arrival date & time: 04/04/19     Chief Complaint   Paresthesia History of Present Illness   [name] is a 48 y.o. year-old female with a history of anxiety, hypertension presenting to the ED with chief complaint of paresthesia.  Patient explains that she was shopping in [employer] when at about 12:30 PM she experienced sudden onset paresthesia to her right arm as well as the right side of her lips.  Symptoms lasting about 35 minutes and then resolving.  Denies any associated headache, no nausea, no vomiting, no dizziness, no lightheadedness, no numbness or weakness to her arms or legs, no slurred speech, no chest pain or shortness of breath, no abdominal pain.  Stated that she just "did not feel right".  Currently without complaints.  Symptoms were mild, no exacerbating or alleviating factors.  Review of Systems  A complete 10 system review of systems was obtained and all systems are negative except as noted in the HPI and PMH.   Patient's Health History    Past Medical History:  Diagnosis Date  . Anxiety   . CIN I (cervical intraepithelial neoplasia I)    1999-LEEP, normal Paps after   . Depression   . Family history of premature CAD 11/20/2018   Mom  . H/O gestational diabetes mellitus, not currently pregnant    GESTATIONAL    . Hypertension     Past Surgical History:  Procedure Laterality Date  . AUGMENTATION MAMMAPLASTY  2005  . CERVICAL BIOPSY  W/ LOOP ELECTRODE EXCISION  1999   CIN I    Family History  Problem Relation Age of Onset  . Diabetes Mother   . Heart disease Mother   . Hypertension Mother   . Hyperlipidemia Mother   . Lung cancer Mother   . Cancer Father        RENAL CELL  . Renal cancer Father   . Heart disease Maternal Grandmother   . Heart disease Paternal Grandmother   . Hypertension Paternal Grandmother   . Heart disease Paternal Grandfather   . Prostate cancer  Paternal Grandfather   . Healthy Daughter   . Epilepsy Son   . Learning disabilities Maternal Aunt   . Diabetes Brother   . Cerebral aneurysm Maternal Grandfather   . Colon cancer Neg Hx     Social History   Socioeconomic History  . Marital status: Divorced    Spouse name: has a boyfriend  . Number of children: 2  . Years of education: Not on file  . Highest education level: Not on file  Occupational History  . 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 Financial resource strain: Not on file  . Food insecurity    Worry: Not on file    Inability: Not on file  . Transportation needs    Medical: Not on file    Non-medical: Not on file  Tobacco Use  . Smoking status: Never Smoker  . Smokeless tobacco: Never Used  Substance and Sexual Activity  . Alcohol use: Yes    Comment: occassionaly  . Drug use: No  . Sexual activity: Yes    Birth control/protection: Pill  Lifestyle  . Physical activity    Days per week: Not on file    Minutes per session: Not on file  . Stress: Not on file  Relationships  . Social connections  Talks on phone: Not on file    Gets together: Not on file    Attends religious service: Not on file    Active member of club or organization: Not on file    Attends meetings of clubs or organizations: Not on file    Relationship status: Not on file  . Intimate partner violence    Fear of current or ex partner: Not on file    Emotionally abused: Not on file    Physically abused: Not on file    Forced sexual activity: Not on file  Other Topics Concern  . Not on file  Social History Narrative   Has a longterm boyfriend.    Has 2 children and boyfriend has 3 children     Physical Exam  Vital Signs and Nursing Notes reviewed Vitals:   04/04/19 1400 04/04/19 1420  BP: (!) 158/83   Pulse: 88   Resp: 18   Temp:  (!) 97.3 F (36.3 C)  SpO2: 100%     CONSTITUTIONAL: Well-appearing, NAD NEURO:  Alert and  oriented x 3, normal and symmetric strength and sensation, normal coordination, normal speech EYES:  eyes equal and reactive, normal extraocular movements ENT/NECK:  no LAD, no JVD CARDIO: Regular rate, well-perfused, normal S1 and S2 PULM:  CTAB no wheezing or rhonchi GI/GU:  normal bowel sounds, non-distended, non-tender MSK/SPINE:  No gross deformities, no edema SKIN:  no rash, atraumatic PSYCH:  Appropriate speech and behavior  Diagnostic and Interventional Summary    EKG Interpretation  Date/Time:  04-04-2019 at 14: 03: 55 Ventricular Rate:  88 PR Interval:  137 QRS Duration: 101 QT Interval:  362 QTC Calculation: 438 R Axis:   47   -3   49 Text Interpretation: Sinus rhythm, no significant change from prior Confirmed by Dr. Kennis CarinaMichael Alexsa Flaum at 2:48 PM      Labs Reviewed  CBC  BASIC METABOLIC PANEL  CBG MONITORING, ED    MR BRAIN WO CONTRAST    (Results Pending)    Medications  LORazepam (ATIVAN) injection 1 mg (has no administration in time range)     Procedures Critical Care  ED Course and Medical Decision Making  I have reviewed the triage vital signs and the nursing notes.  Pertinent labs & imaging results that were available during my care of the patient were reviewed by me and considered in my medical decision making (see below for details).  Migraine versus anxiety versus carpal tunnel versus less likely TIA.  Patient currently is with a completely normal neurological exam.  She explains that she has a history of migraines, but they have not presented in this way in the past.  Discussed case with neurology on-call, who recommends MRI today with migraine cocktail, and if unremarkable discharge home.  No need for TIA admission given the low risk in the most likely cause being complex migraine.  Signed out to oncoming provider at shift change.  Elmer SowMichael M. Pilar PlateBero, MD [facility] [email or web address]  Final Clinical  Impressions(s) / ED Diagnoses     ICD-10-CM   1. Paresthesia  R20.2     ED Discharge Orders    None         Sabas SousBero, Harleen Fineberg M, MD 04/04/19 [phone]

## 2019-04-04 NOTE — ED Notes (Signed)
Pt ambulated to restroom with steady gait. No complains of numbness or weakness in her legs. Urine specimen collected.

## 2019-04-04 NOTE — ED Notes (Signed)
Pt states that she does not want compazine and benadryl at this time. EDP made aware.

## 2019-04-04 NOTE — ED Provider Notes (Signed)
Pt signed out by Dr. Sedonia Small pending MRI result.  MRI is normal.  Pt is feeling much better.  She is stable for d/c.  Return if worse.   Isla Pence, MD 04/04/19 1649

## 2019-04-04 NOTE — ED Triage Notes (Signed)
States [place] was [place] [employer] and her right atm started tingling then right side of face nose and lips. Denies dizziness, just didn't feel right. Denies sob , denies pain. States it has currently all subsided.

## 2019-05-08 ENCOUNTER — Encounter: Payer: Self-pay | Admitting: Family Medicine

## 2019-05-08 ENCOUNTER — Other Ambulatory Visit: Payer: Self-pay

## 2019-05-08 ENCOUNTER — Ambulatory Visit (INDEPENDENT_AMBULATORY_CARE_PROVIDER_SITE_OTHER): Payer: No Typology Code available for payment source | Admitting: Family Medicine

## 2019-05-08 VITALS — BP 126/80 | HR 102 | Temp 97.2°F | Resp 14 | Ht 63.0 in | Wt 128.0 lb

## 2019-05-08 DIAGNOSIS — F419 Anxiety disorder, unspecified: Secondary | ICD-10-CM | POA: Diagnosis not present

## 2019-05-08 DIAGNOSIS — Z8249 Family history of ischemic heart disease and other diseases of the circulatory system: Secondary | ICD-10-CM | POA: Diagnosis not present

## 2019-05-08 DIAGNOSIS — K219 Gastro-esophageal reflux disease without esophagitis: Secondary | ICD-10-CM

## 2019-05-08 DIAGNOSIS — I10 Essential (primary) hypertension: Secondary | ICD-10-CM

## 2019-05-08 HISTORY — DX: Essential (primary) hypertension: I10

## 2019-05-08 MED ORDER — OMEPRAZOLE 20 MG PO CPDR: 20.0000 mg | DELAYED_RELEASE_CAPSULE | Freq: Every day | ORAL | 3 refills | Status: DC

## 2019-05-08 MED ORDER — METOPROLOL SUCCINATE ER 50 MG PO TB24: 50.0000 mg | ORAL_TABLET | Freq: Every day | ORAL | 3 refills | Status: DC

## 2019-05-08 NOTE — Addendum Note (Signed)
Addended by: Billey Chang on: 05/08/2019 03:59 PM   Modules accepted: Orders

## 2019-05-08 NOTE — Progress Notes (Signed)
Subjective  CC:  Chief Complaint  Patient presents with  . Hypertension    Has been checking BPs at home and she reports that they have been 120/90 and lower  . Gastroesophageal Reflux    Has been having some heartburn, taking TUMS or PEPCID with minimal relief    HPI: [name] is a 48 y.o. female who presents to the office today to address the problems listed above in the chief complaint.  Hypertension f/u: at last visit we started low-dose beta-blocker.she is doing well with medication.  Home blood pressure readings are averaging 120s over 90s occasionally in the high 80s to low 80s.  Diastolics are never in the 22s.  She reports an occasional palpitation but no chest pain, shortness of breath or dyspnea on exertion.  Denies adverse effects from his BP medications. Compliance with medication is good.   Anxiety: Unfortunately she lost her job yesterday.  Stress has been high.  She is using Xanax rarely but it is helpful.  She was recently at the ER with paresthesias and numbness and tingling in the hands.  I thought this could be an atypical migraine although it seems more consistent with stress induced anxiety symptoms.  Patient agrees.  She feels she is coping well.  GERD symptoms are now active with heartburn daily.  Over-the-counter medicines without relief.  No melena or hematemesis.  No weight loss.  Appetite is fine  Assessment  1. Essential hypertension   2. Family history of premature CAD   3. Gastroesophageal reflux disease, esophagitis presence not specified   4. Anxiety      Plan    Hypertension f/u: BP control is fairly well controlled.  We will increase Toprol-XL to 50 mg daily.  This should help control blood pressure, heart rate and some anxiety symptoms.  Patient agrees with plan.  She will keep an eye on her home blood pressure readings.  Anxiety f/u: Continue PRN Xanax.  Monitor anxiety symptoms.  Reassured.  Follow-up if worsening for further evaluation  and treatment options.  GERD symptoms: Start PPI for 3 to 12 weeks.  No red flag symptoms identified. Education regarding management of these chronic disease states was given. Management strategies discussed on successive visits include dietary and exercise recommendations, goals of achieving and maintaining IBW, and lifestyle modifications aiming for adequate sleep and minimizing stressors.   Follow up: Return in about 6 months (around 11/08/2019) for follow up Hypertension.  No orders of the defined types were placed in this encounter.  Meds ordered this encounter  Medications  . metoprolol succinate (TOPROL-XL) 50 MG 24 hr tablet    Sig: Take 1 tablet (50 mg total) by mouth daily.    Dispense:  90 tablet    Refill:  3  . omeprazole (PRILOSEC) 20 MG capsule    Sig: Take 1 capsule (20 mg total) by mouth daily.    Dispense:  30 capsule    Refill:  3      BP Readings from Last 3 Encounters:  05/08/19 126/80  04/04/19 (!) 158/83  02/26/19 (!) 145/95   Wt Readings from Last 3 Encounters:  05/08/19 128 lb (58.1 kg)  04/04/19 129 lb (58.5 kg)  02/26/19 135 lb 6.4 oz (61.4 kg)    Lab Results  Component Value Date   CHOL 158 02/26/2019   CHOL 147 04/20/2016   CHOL 154 01/13/2014   Lab Results  Component Value Date   HDL 46.10 02/26/2019   HDL 43 (  L) 04/20/2016   HDL 54 01/13/2014   Lab Results  Component Value Date   LDLCALC 91 02/26/2019   LDLCALC 85 04/20/2016   LDLCALC 75 01/13/2014   Lab Results  Component Value Date   TRIG 106.0 02/26/2019   TRIG 93 04/20/2016   TRIG 125 01/13/2014   Lab Results  Component Value Date   CHOLHDL 3 02/26/2019   CHOLHDL 3.4 04/20/2016   CHOLHDL 2.9 01/13/2014   No results found for: LDLDIRECT Lab Results  Component Value Date   CREATININE 0.69 04/04/2019   BUN 11 04/04/2019   NA 138 04/04/2019   K 3.7 04/04/2019   CL 106 04/04/2019   CO2 22 04/04/2019    The 10-year ASCVD risk score Denman George(Goff DC Jr., et al., 2013) is:  1.3%   Values used to calculate the score:     Age: [age] years     Sex: Female     Is Non-Hispanic African American: No     Diabetic: No     Tobacco smoker: No     Systolic Blood Pressure: 126 mmHg     Is BP treated: Yes     HDL Cholesterol: 46.1 mg/dL     Total Cholesterol: 158 mg/dL  I reviewed the patients updated PMH, FH, and SocHx.    Patient Active Problem List   Diagnosis Date Noted  . Essential hypertension 05/08/2019  . Family history of premature CAD 11/20/2018  . Allergic rhinitis 12/17/2012  . Anxiety 06/27/2012    Allergies: Patient has no known allergies.  Social History: Patient  reports that she has never smoked. She has never used smokeless tobacco. She reports current alcohol use. She reports that she does not use drugs.  Current Meds  Medication Sig  . ALPRAZolam (XANAX) 0.25 MG tablet Take 1 tablet (0.25 mg total) by mouth at bedtime as needed for anxiety.  Marland Kitchen. levonorgestrel-ethinyl estradiol (AVIANE,ALESSE,LESSINA) 0.1-20 MG-MCG tablet Take 1 tablet by mouth daily.  . metoprolol succinate (TOPROL-XL) 50 MG 24 hr tablet Take 1 tablet (50 mg total) by mouth daily.  . [DISCONTINUED] metoprolol succinate (TOPROL-XL) 25 MG 24 hr tablet Take 1 tablet (25 mg total) by mouth daily.    Review of Systems: Cardiovascular: negative for chest pain, palpitations, leg swelling, orthopnea Respiratory: negative for SOB, wheezing or persistent cough Gastrointestinal: negative for abdominal pain Genitourinary: negative for dysuria or gross hematuria  Objective  Vitals: BP 126/80   Pulse (!) 102   Temp (!) 97.2 F (36.2 C) (Tympanic)   Resp 14   Ht 5\' 3"  (1.6 m)   Wt 128 lb (58.1 kg)   LMP 05/01/2019   SpO2 98%   BMI 22.67 kg/m  General: no acute distress  Psych:  Alert and oriented, normal mood and affect HEENT:  Normocephalic, atraumatic, supple neck  Cardiovascular:  RRR without murmur. no edema, heart rate was rechecked by me and was 82 Respiratory:  Good  breath sounds bilaterally, CTAB with normal respiratory effort Gastrointestinal: soft, flat abdomen, normal active bowel sounds, no palpable masses, no hepatosplenomegaly, no appreciated hernias  Skin:  Warm, no rashes Neurologic:   Mental status is normal  Commons side effects, risks, benefits, and alternatives for medications and treatment plan prescribed today were discussed, and the patient expressed understanding of the given instructions. Patient is instructed to call or message via MyChart if he/she has any questions or concerns regarding our treatment plan. No barriers to understanding were identified. We discussed Red Flag symptoms and signs in  detail. Patient expressed understanding regarding what to do in case of urgent or emergency type symptoms.   Medication list was reconciled, printed and provided to the patient in AVS. Patient instructions and summary information was reviewed with the patient as documented in the AVS. This note was prepared with assistance of Dragon voice recognition software. Occasional wrong-word or sound-a-like substitutions may have occurred due to the inherent limitations of voice recognition software

## 2019-05-08 NOTE — Patient Instructions (Signed)
Please return in 6 months for follow up of your hypertension.  We are increasing your blood pressure medication today.  Keep an eye on your blood pressures.  Let me know if he has any problems. Take the omeprazole for 6 to 12 weeks to help your heartburn symptoms. Let me know if you need more help with your anxiety.  Good luck  If you have any questions or concerns, please don't hesitate to send me a message via MyChart or call the office at [phone]. Thank you for visiting with [name] today! It's our pleasure caring for you.   Hypertension, Adult High blood pressure (hypertension) is when the force of blood pumping through the arteries is too strong. The arteries are the blood vessels that carry blood from the heart throughout the body. Hypertension forces the heart to work harder to pump blood and may cause arteries to become narrow or stiff. Untreated or uncontrolled hypertension can cause a heart attack, heart failure, a stroke, kidney disease, and other problems. A blood pressure reading consists of a higher number over a lower number. Ideally, your blood pressure should be below 120/80. The first ("top") number is called the systolic pressure. It is a measure of the pressure in your arteries as your heart beats. The second ("bottom") number is called the diastolic pressure. It is a measure of the pressure in your arteries as the heart relaxes. What are the causes? The exact cause of this condition is not known. There are some conditions that result in or are related to high blood pressure. What increases the risk? Some risk factors for high blood pressure are under your control. The following factors may make you more likely to develop this condition:  Smoking.  Having type 2 diabetes mellitus, high cholesterol, or both.  Not getting enough exercise or physical activity.  Being overweight.  Having too much fat, sugar, calories, or salt (sodium) in your diet.  Drinking too much  alcohol. Some risk factors for high blood pressure may be difficult or impossible to change. Some of these factors include:  Having chronic kidney disease.  Having a family history of high blood pressure.  Age. Risk increases with age.  Race. You may be at higher risk if you are African American.  Gender. Men are at higher risk than women before age 50. After age 50, women are at higher risk than men.  Having obstructive sleep apnea.  Stress. What are the signs or symptoms? High blood pressure may not cause symptoms. Very high blood pressure (hypertensive crisis) may cause:  Headache.  Anxiety.  Shortness of breath.  Nosebleed.  Nausea and vomiting.  Vision changes.  Severe chest pain.  Seizures. How is this diagnosed? This condition is diagnosed by measuring your blood pressure while you are seated, with your arm resting on a flat surface, your legs uncrossed, and your feet flat on the floor. The cuff of the blood pressure monitor will be placed directly against the skin of your upper arm at the level of your heart. It should be measured at least twice using the same arm. Certain conditions can cause a difference in blood pressure between your right and left arms. Certain factors can cause blood pressure readings to be lower or higher than normal for a short period of time:  When your blood pressure is higher when you are in a health care provider's office than when you are at home, this is called white coat hypertension. Most people with this condition  do not need medicines.  When your blood pressure is higher at home than when you are in a health care provider's office, this is called masked hypertension. Most people with this condition may need medicines to control blood pressure. If you have a high blood pressure reading during one visit or you have normal blood pressure with other risk factors, you may be asked to:  Return on a different day to have your blood  pressure checked again.  Monitor your blood pressure at home for 1 week or longer. If you are diagnosed with hypertension, you may have other blood or imaging tests to help your health care provider understand your overall risk for other conditions. How is this treated? This condition is treated by making healthy lifestyle changes, such as eating healthy foods, exercising more, and reducing your alcohol intake. Your health care provider may prescribe medicine if lifestyle changes are not enough to get your blood pressure under control, and if:  Your systolic blood pressure is above 130.  Your diastolic blood pressure is above 80. Your personal target blood pressure may vary depending on your medical conditions, your age, and other factors. Follow these instructions at home: Eating and drinking   Eat a diet that is high in fiber and potassium, and low in sodium, added sugar, and fat. An example eating plan is called the DASH (Dietary Approaches to Stop Hypertension) diet. To eat this way: ? Eat plenty of fresh fruits and vegetables. Try to fill one half of your plate at each meal with fruits and vegetables. ? Eat whole grains, such as whole-wheat pasta, brown rice, or whole-grain bread. Fill about one fourth of your plate with whole grains. ? Eat or drink low-fat dairy products, such as skim milk or low-fat yogurt. ? Avoid fatty cuts of meat, processed or cured meats, and poultry with skin. Fill about one fourth of your plate with lean proteins, such as fish, chicken without skin, beans, eggs, or tofu. ? Avoid pre-made and processed foods. These tend to be higher in sodium, added sugar, and fat.  Reduce your daily sodium intake. Most people with hypertension should eat less than 1,500 mg of sodium a day.  Do not drink alcohol if: ? Your health care provider tells you not to drink. ? You are pregnant, may be pregnant, or are planning to become pregnant.  If you drink alcohol: ? Limit how  much you use to:  0-1 drink a day for women.  0-2 drinks a day for men. ? Be aware of how much alcohol is in your drink. In the [place] one drink equals one 12 oz bottle of beer (355 mL), one 5 oz glass of wine (148 mL), or one 1 oz glass of hard liquor (44 mL). Lifestyle   Work with your health care provider to maintain a healthy body weight or to lose weight. Ask what an ideal weight is for you.  Get at least 30 minutes of exercise most days of the week. Activities may include walking, swimming, or biking.  Include exercise to strengthen your muscles (resistance exercise), such as Pilates or lifting weights, as part of your weekly exercise routine. Try to do these types of exercises for 30 minutes at least 3 days a week.  Do not use any products that contain nicotine or tobacco, such as cigarettes, e-cigarettes, and chewing tobacco. If you need help quitting, ask your health care provider.  Monitor your blood pressure at home as told by your health  care provider.  Keep all follow-up visits as told by your health care provider. This is important. Medicines  Take over-the-counter and prescription medicines only as told by your health care provider. Follow directions carefully. Blood pressure medicines must be taken as prescribed.  Do not skip doses of blood pressure medicine. Doing this puts you at risk for problems and can make the medicine less effective.  Ask your health care provider about side effects or reactions to medicines that you should watch for. Contact a health care provider if you:  Think you are having a reaction to a medicine you are taking.  Have headaches that keep coming back (recurring).  Feel dizzy.  Have swelling in your ankles.  Have trouble with your vision. Get help right away if you:  Develop a severe headache or confusion.  Have unusual weakness or numbness.  Feel faint.  Have severe pain in your chest or abdomen.  Vomit repeatedly.  Have  trouble breathing. Summary  Hypertension is when the force of blood pumping through your arteries is too strong. If this condition is not controlled, it may put you at risk for serious complications.  Your personal target blood pressure may vary depending on your medical conditions, your age, and other factors. For most people, a normal blood pressure is less than 120/80.  Hypertension is treated with lifestyle changes, medicines, or a combination of both. Lifestyle changes include losing weight, eating a healthy, low-sodium diet, exercising more, and limiting alcohol. This information is not intended to replace advice given to you by your health care provider. Make sure you discuss any questions you have with your health care provider. Document Released: 09/04/2005 Document Revised: 05/15/2018 Document Reviewed: 05/15/2018 Elsevier Patient Education  2020 [employer].

## 2019-05-23 ENCOUNTER — Other Ambulatory Visit: Payer: Self-pay

## 2019-05-23 ENCOUNTER — Ambulatory Visit (INDEPENDENT_AMBULATORY_CARE_PROVIDER_SITE_OTHER): Payer: No Typology Code available for payment source | Admitting: Family Medicine

## 2019-05-23 ENCOUNTER — Encounter: Payer: Self-pay | Admitting: Family Medicine

## 2019-05-23 VITALS — BP 124/78 | HR 93 | Temp 98.2°F | Resp 16 | Ht 63.0 in | Wt 126.2 lb

## 2019-05-23 DIAGNOSIS — S29011A Strain of muscle and tendon of front wall of thorax, initial encounter: Secondary | ICD-10-CM

## 2019-05-23 MED ORDER — CYCLOBENZAPRINE HCL 10 MG PO TABS: 10.0000 mg | ORAL_TABLET | Freq: Every evening | ORAL | 0 refills | Status: DC | PRN

## 2019-05-23 NOTE — Progress Notes (Signed)
Subjective  CC:  Chief Complaint  Patient presents with  . Back Pain    Upper back, reports that she has moving some furniture.. Movement makes worse and feeling a pulling the front of her chest.. Has tried Tylenol    HPI: [name] is a 48 y.o. female who presents to the office today to address the problems listed above in the chief complaint.  Painting at home and moving heavy furniture: more pushing and pulling rather than lifting and now with upper bilateral back pain and some anterior chest wall pain. Increase pain with movements, sitting upright with shoulders back, deep breaths. No neck pain or low back pain. No arm pain. Denies sob or anginal pain. Used tylenol. Some disruption of sleep. No chronic back problems.    Assessment  1. Chest wall muscle strain, initial encounter      Plan   Mm strain:  Educated. Nsaids, mm relaxer and rest. Discussed further eval if sxs change or worsen.   Follow up: No follow-ups on file.  11/10/2019  No orders of the defined types were placed in this encounter.  Meds ordered this encounter  Medications  . cyclobenzaprine (FLEXERIL) 10 MG tablet    Sig: Take 1 tablet (10 mg total) by mouth at bedtime as needed for muscle spasms.    Dispense:  20 tablet    Refill:  0      I reviewed the patients updated PMH, FH, and SocHx.    Patient Active Problem List   Diagnosis Date Noted  . Essential hypertension 05/08/2019  . Family history of premature CAD 11/20/2018  . Allergic rhinitis 12/17/2012  . Anxiety 06/27/2012   Current Meds  Medication Sig  . ALPRAZolam (XANAX) 0.25 MG tablet Take 1 tablet (0.25 mg total) by mouth at bedtime as needed for anxiety.  Marland Kitchen levonorgestrel-ethinyl estradiol (AVIANE,ALESSE,LESSINA) 0.1-20 MG-MCG tablet Take 1 tablet by mouth daily.  . metoprolol succinate (TOPROL-XL) 25 MG 24 hr tablet Take 1 tablet (25 mg total) by mouth daily.  Marland Kitchen omeprazole (PRILOSEC) 20 MG capsule Take 1 capsule (20 mg total) by  mouth daily.    Allergies: Patient has No Known Allergies. Family History: Patient family history includes Cancer in her father; Cerebral aneurysm in her maternal grandfather; Diabetes in her brother and mother; Epilepsy in her son; Healthy in her daughter; Heart disease in her maternal grandmother, mother, paternal grandfather, and paternal grandmother; Hyperlipidemia in her mother; Hypertension in her mother and paternal grandmother; Learning disabilities in her maternal aunt; Lung cancer in her mother; Prostate cancer in her paternal grandfather; Renal cancer in her father. Social History:  Patient  reports that she has never smoked. She has never used smokeless tobacco. She reports current alcohol use. She reports that she does not use drugs.  Review of Systems: Constitutional: Negative for fever malaise or anorexia Cardiovascular: negative for chest pain Respiratory: negative for SOB or persistent cough Gastrointestinal: negative for abdominal pain  Objective  Vitals: BP 124/78   Pulse 93   Temp 98.2 F (36.8 C) (Tympanic)   Resp 16   Ht 5\' 3"  (1.6 m)   Wt 126 lb 3.2 oz (57.2 kg)   LMP 05/01/2019   SpO2 98%   BMI 22.36 kg/m  General: no acute distress , A&Ox3 HEENT: PEERL, conjunctiva normal, Oropharynx moist,neck is supple Cardiovascular:  RRR without murmur or gallop.  Respiratory:  Good breath sounds bilaterally, CTAB with normal respiratory effort Back: subscapular and trap ttp bilaterally, + ant  chest ttp Skin:  Warm, no rashes     Commons side effects, risks, benefits, and alternatives for medications and treatment plan prescribed today were discussed, and the patient expressed understanding of the given instructions. Patient is instructed to call or message via MyChart if he/she has any questions or concerns regarding our treatment plan. No barriers to understanding were identified. We discussed Red Flag symptoms and signs in detail. Patient expressed understanding  regarding what to do in case of urgent or emergency type symptoms.   Medication list was reconciled, printed and provided to the patient in AVS. Patient instructions and summary information was reviewed with the patient as documented in the AVS. This note was prepared with assistance of Dragon voice recognition software. Occasional wrong-word or sound-a-like substitutions may have occurred due to the inherent limitations of voice recognition software

## 2019-05-23 NOTE — Patient Instructions (Signed)
Please follow up if symptoms do not improve or as needed.   Use alleve, 2 tabs twice a day with food to ease the pain and soreness. You may add tylenol in between if needed. You can use the muscle relaxer at night if needed as well. It will help you sleep.

## 2019-05-28 ENCOUNTER — Encounter: Payer: Self-pay | Admitting: Gastroenterology

## 2019-05-28 ENCOUNTER — Encounter: Payer: Self-pay | Admitting: Family Medicine

## 2019-05-28 ENCOUNTER — Ambulatory Visit: Payer: No Typology Code available for payment source | Admitting: Family Medicine

## 2019-05-28 DIAGNOSIS — K219 Gastro-esophageal reflux disease without esophagitis: Secondary | ICD-10-CM

## 2019-06-11 ENCOUNTER — Encounter: Payer: Self-pay | Admitting: Gynecology

## 2019-06-16 ENCOUNTER — Encounter: Payer: Self-pay | Admitting: Family Medicine

## 2019-06-18 ENCOUNTER — Encounter: Payer: Self-pay | Admitting: Family Medicine

## 2019-06-18 ENCOUNTER — Other Ambulatory Visit: Payer: Self-pay

## 2019-06-18 ENCOUNTER — Ambulatory Visit (INDEPENDENT_AMBULATORY_CARE_PROVIDER_SITE_OTHER): Payer: No Typology Code available for payment source | Admitting: Family Medicine

## 2019-06-18 VITALS — BP 142/80 | HR 111 | Temp 97.6°F | Resp 14 | Ht 63.0 in | Wt 124.8 lb

## 2019-06-18 DIAGNOSIS — K219 Gastro-esophageal reflux disease without esophagitis: Secondary | ICD-10-CM

## 2019-06-18 DIAGNOSIS — I10 Essential (primary) hypertension: Secondary | ICD-10-CM

## 2019-06-18 DIAGNOSIS — G5603 Carpal tunnel syndrome, bilateral upper limbs: Secondary | ICD-10-CM

## 2019-06-18 DIAGNOSIS — F419 Anxiety disorder, unspecified: Secondary | ICD-10-CM

## 2019-06-18 NOTE — Patient Instructions (Signed)
Please follow up if symptoms do not improve or as needed.   Your blood work has all been normal.  Treat your carpal tunnel.   Please call [facility] to schedule an appointment with Dr. Trey Paula; she is a therapist here at our [place] office.  The phone number is: [phone]   Stress Stress is a normal reaction to life events. Stress is what you feel when life demands more than you are used to, or more than you think you can handle. Some stress can be useful, such as studying for a test or meeting a deadline at work. Stress that occurs too often or for too long can cause problems. It can affect your emotional health and interfere with relationships and normal daily activities. Too much stress can weaken your body's defense system (immune system) and increase your risk for physical illness. If you already have a medical problem, stress can make it worse. What are the causes? All sorts of life events can cause stress. An event that causes stress for one person may not be stressful for another person. Major life events, whether positive or negative, commonly cause stress. Examples include:  Losing a job or starting a new job.  Losing a loved one.  Moving to a new town or home.  Getting married or divorced.  Having a baby.  Injury or illness. Less obvious life events can also cause stress, especially if they occur day after day or in combination with each other. Examples include:  Working long hours.  Driving in traffic.  Caring for children.  Being in debt.  Being in a difficult relationship. What are the signs or symptoms? Stress can cause emotional symptoms, including:  Anxiety. This is feeling worried, afraid, on edge, overwhelmed, or out of control.  Anger, including irritation or impatience.  Depression. This is feeling sad, down, helpless, or guilty.  Trouble focusing, remembering, or making decisions. Stress can cause physical  symptoms, including:  Aches and pains. These may affect your head, neck, back, stomach, or other areas of your body.  Tight muscles or a clenched jaw.  Low energy.  Trouble sleeping. Stress can cause unhealthy behaviors, including:  Eating to feel better (overeating) or skipping meals.  Working too much or putting off tasks.  Smoking, drinking alcohol, or using drugs to feel better. How is this diagnosed? Stress is diagnosed through an assessment by your health care provider. He or she may diagnose this condition based on:  Your symptoms and any stressful life events.  Your medical history.  Tests to rule out other causes of your symptoms. Depending on your condition, your health care provider may refer you to a specialist for further evaluation. How is this treated?  Stress management techniques are the recommended treatment for stress. Medicine is not typically recommended for the treatment of stress. Techniques to reduce your reaction to stressful life events include:  Stress identification. Monitor yourself for symptoms of stress and identify what causes stress for you. These skills may help you to avoid or prepare for stressful events.  Time management. Set your priorities, keep a calendar of events, and learn to say "no." Taking these actions can help you avoid making too many commitments. Techniques for coping with stress include:  Rethinking the problem. Try to think realistically about stressful events rather than ignoring them or overreacting. Try to find the positives in a stressful situation rather than focusing on the negatives.  Exercise. Physical exercise can release both  physical and emotional tension. The key is to find a form of exercise that you enjoy and do it regularly.  Relaxation techniques. These relax the body and mind. The key is to find one or more that you enjoy and use the technique(s) regularly. Examples include: ? Meditation, deep breathing, or  progressive relaxation techniques. ? Yoga or tai chi. ? Biofeedback, mindfulness techniques, or journaling. ? Listening to music, being out in nature, or participating in other hobbies.  Practicing a healthy lifestyle. Eat a balanced diet, drink plenty of water, limit or avoid caffeine, and get plenty of sleep.  Having a strong support network. Spend time with family, friends, or other people you enjoy being around. Express your feelings and talk things over with someone you trust. Counseling or talk therapy with a mental health professional may be helpful if you are having trouble managing stress on your own. Follow these instructions at home: Lifestyle   Avoid drugs.  Do not use any products that contain nicotine or tobacco, such as cigarettes and e-cigarettes. If you need help quitting, ask your health care provider.  Limit alcohol intake to no more than 1 drink a day for nonpregnant women and 2 drinks a day for men. One drink equals 12 oz of beer, 5 oz of wine, or 1 oz of hard liquor.  Do not use alcohol or drugs to relax.  Eat a balanced diet that includes fresh fruits and vegetables, whole grains, lean meats, fish, eggs, and beans, and low-fat dairy. Avoid processed foods and foods high in added fat, sugar, and salt.  Exercise at least 30 minutes on 5 or more days each week.  Get 7-8 hours of sleep each night. General instructions   Practice stress management techniques as discussed with your health care provider.  Drink enough fluid to keep your urine clear or pale yellow.  Take over-the-counter and prescription medicines only as told by your health care provider.  Keep all follow-up visits as told by your health care provider. This is important. Contact a health care provider if:  Your symptoms get worse.  You have new symptoms.  You feel overwhelmed by your problems and can no longer manage them on your own. Get help right away if:  You have thoughts of hurting  yourself or others. If you ever feel like you may hurt yourself or others, or have thoughts about taking your own life, get help right away. You can go to your nearest emergency department or call:  Your local emergency services (911 in the [place]  A suicide crisis helpline, such as the [facility] at 702-593-4869. This is open 24 hours a day. Summary  Stress is a normal reaction to life events. It can cause problems if it happens too often or for too long.  Practicing stress management techniques is the best way to treat stress.  Counseling or talk therapy with a mental health professional may be helpful if you are having trouble managing stress on your own. This information is not intended to replace advice given to you by your health care provider. Make sure you discuss any questions you have with your health care provider. Document Released: 02/28/2001 Document Revised: 08/17/2017 Document Reviewed: 10/25/2016 Elsevier Patient Education  [place] tunnel syndrome is a condition that causes pain in your hand and arm. The carpal tunnel is a narrow area located on the palm side of your wrist. Repeated wrist motion or  certain diseases may cause swelling within the tunnel. This swelling pinches the main nerve in the wrist (median nerve). What are the causes? This condition may be caused by:  Repeated wrist motions.  Wrist injuries.  Arthritis.  A cyst or tumor in the carpal tunnel.  Fluid buildup during pregnancy. Sometimes the cause of this condition is not known. What increases the risk? The following factors may make you more likely to develop this condition:  Having a job, such as being a Research scientist (life sciences), that requires you to repeatedly move your wrist in the same motion.  Being a woman.  Having certain conditions, such as: ? Diabetes. ? Obesity. ? An underactive thyroid (hypothyroidism). ?  Kidney failure. What are the signs or symptoms? Symptoms of this condition include:  A tingling feeling in your fingers, especially in your thumb, index, and middle fingers.  Tingling or numbness in your hand.  An aching feeling in your entire arm, especially when your wrist and elbow are bent for a long time.  Wrist pain that goes up your arm to your shoulder.  Pain that goes down into your palm or fingers.  A weak feeling in your hands. You may have trouble grabbing and holding items. Your symptoms may feel worse during the night. How is this diagnosed? This condition is diagnosed with a medical history and physical exam. You may also have tests, including:  Electromyogram (EMG). This test measures electrical signals sent by your nerves into the muscles.  Nerve conduction study. This test measures how well electrical signals pass through your nerves.  Imaging tests, such as X-rays, ultrasound, and MRI. These tests check for possible causes of your condition. How is this treated? This condition may be treated with:  Lifestyle changes. It is important to stop or change the activity that caused your condition.  Doing exercise and activities to strengthen your muscles and bones (physical therapy).  Learning how to use your hand again after diagnosis (occupational therapy).  Medicines for pain and inflammation. This may include medicine that is injected into your wrist.  A wrist splint.  Surgery. Follow these instructions at home: If you have a splint:  Wear the splint as told by your health care provider. Remove it only as told by your health care provider.  Loosen the splint if your fingers tingle, become numb, or turn cold and blue.  Keep the splint clean.  If the splint is not waterproof: ? Do not let it get wet. ? Cover it with a watertight covering when you take a bath or shower. Managing pain, stiffness, and swelling   If directed, put ice on the painful area:  ? If you have a removable splint, remove it as told by your health care provider. ? Put ice in a plastic bag. ? Place a towel between your skin and the bag. ? Leave the ice on for 20 minutes, 2-3 times per day. General instructions  Take over-the-counter and prescription medicines only as told by your health care provider.  Rest your wrist from any activity that may be causing your pain. If your condition is work related, talk with your employer about changes that can be made, such as getting a wrist pad to use while typing.  Do any exercises as told by your health care provider, physical therapist, or occupational therapist.  Keep all follow-up visits as told by your health care provider. This is important. Contact a health care provider if:  You have new symptoms.  Your pain is not controlled with medicines.  Your symptoms get worse. Get help right away if:  You have severe numbness or tingling in your wrist or hand. Summary  Carpal tunnel syndrome is a condition that causes pain in your hand and arm.  It is usually caused by repeated wrist motions.  Lifestyle changes and medicines are used to treat carpal tunnel syndrome. Surgery may be recommended.  Follow your health care provider's instructions about wearing a splint, resting from activity, keeping follow-up visits, and calling for help. This information is not intended to replace advice given to you by your health care provider. Make sure you discuss any questions you have with your health care provider. Document Released: 09/01/2000 Document Revised: 01/11/2018 Document Reviewed: 01/11/2018 Elsevier Patient Education  2020 [employer].

## 2019-06-18 NOTE — Progress Notes (Signed)
Subjective  CC:  Chief Complaint  Patient presents with  . Numbness    Bilateral hands and feet, has been going on for 1 week.Marland Kitchen Has been getting woke up by symptoms  . Hypertension    Home BPs have been within normal range.. Average is 118/80    HPI: [name] is a 48 y.o. female who presents to the office today to address the problems listed above in the chief complaint.  HTN: on BB. Worries it is causing her hands to be numb and cold. Home readings are now normal. She was very scared about the new dx. No cp but occasionally feels like she can't take a deep breath.   Anxiety: chronic worrier; worries she will have something bad and die prematurely like her parents. She is a single mom and worries about the welfare of her children. She has been healthy. She has failed wellbutrin, lexapro and one other SSRI in the past. She has used xanax prescribed by me; this use has lessened since being on BB.   Bilateral hand numbness at night; improves with flapping hands. Lasts minutes. No pain or weakness. Then thinks she has had some feet sxs as well but not prominent. She worries about vascular disease. She does have a remote h/o carpal tunnel; she has been using computer work more.   GERD: worse on PPI. Stopped omeprazole. Feels like she is regurgitating. Has been referred to GI.  Assessment  1. Essential hypertension   2. Anxiety   3. Carpal tunnel syndrome, bilateral   4. Gastroesophageal reflux disease, esophagitis presence not specified      Plan   HTN:  Well controlled on bb. Reassured.   Anxiety: PTSD vs GAD: rec CBT and prn xanax. Suspect many sxs are somatic. Counseling done. Reassured.  CTS: educated. Start night splints and follow.   GERD: to see GI. Continue tums/pepcid for now.   Declines flu shot at this time. Education given. She will think about it.  Follow up:   11/10/2019  No orders of the defined types were placed in this encounter.  No orders of the  defined types were placed in this encounter.     I reviewed the patients updated PMH, FH, and SocHx.    Patient Active Problem List   Diagnosis Date Noted  . Gastroesophageal reflux disease 06/18/2019  . Carpal tunnel syndrome, bilateral 06/18/2019  . Essential hypertension 05/08/2019  . Family history of premature CAD 11/20/2018  . Allergic rhinitis 12/17/2012  . Anxiety 06/27/2012   Current Meds  Medication Sig  . ALPRAZolam (XANAX) 0.25 MG tablet Take 1 tablet (0.25 mg total) by mouth at bedtime as needed for anxiety.  . cyclobenzaprine (FLEXERIL) 10 MG tablet Take 1 tablet (10 mg total) by mouth at bedtime as needed for muscle spasms.  Marland Kitchen levonorgestrel-ethinyl estradiol (AVIANE,ALESSE,LESSINA) 0.1-20 MG-MCG tablet Take 1 tablet by mouth daily.  . metoprolol succinate (TOPROL-XL) 25 MG 24 hr tablet Take 1 tablet (25 mg total) by mouth daily.  . [DISCONTINUED] omeprazole (PRILOSEC) 20 MG capsule Take 1 capsule (20 mg total) by mouth daily.    Allergies: Patient has No Known Allergies. Family History: Patient family history includes Cancer in her father; Cerebral aneurysm in her maternal grandfather; Diabetes in her brother and mother; Epilepsy in her son; Healthy in her daughter; Heart disease in her maternal grandmother, mother, paternal grandfather, and paternal grandmother; Hyperlipidemia in her mother; Hypertension in her mother and paternal grandmother; Learning disabilities in her maternal  aunt; Lung cancer in her mother; Prostate cancer in her paternal grandfather; Renal cancer in her father. Social History:  Patient  reports that she has never smoked. She has never used smokeless tobacco. She reports current alcohol use. She reports that she does not use drugs.  Review of Systems: Constitutional: Negative for fever malaise or anorexia Cardiovascular: negative for chest pain Respiratory: negative for SOB or persistent cough Gastrointestinal: negative for abdominal pain   Objective  Vitals: BP (!) 142/80   Pulse (!) 111   Temp 97.6 F (36.4 C) (Tympanic)   Resp 14   Ht 5\' 3"  (1.6 m)   Wt 124 lb 12.8 oz (56.6 kg)   LMP 06/04/2019   SpO2 98%   BMI 22.11 kg/m  General: no acute distress , A&Ox3 HEENT: PEERL, conjunctiva normal, Oropharynx moist,neck is supple Cardiovascular:  RRR without murmur or gallop. Normal distal pulses Respiratory:  Good breath sounds bilaterally, CTAB with normal respiratory effort Skin:  Warm, no rashes MSK: + phalen's bilateral. Nl grip.      Commons side effects, risks, benefits, and alternatives for medications and treatment plan prescribed today were discussed, and the patient expressed understanding of the given instructions. Patient is instructed to call or message via MyChart if he/she has any questions or concerns regarding our treatment plan. No barriers to understanding were identified. We discussed Red Flag symptoms and signs in detail. Patient expressed understanding regarding what to do in case of urgent or emergency type symptoms.   Medication list was reconciled, printed and provided to the patient in AVS. Patient instructions and summary information was reviewed with the patient as documented in the AVS. This note was prepared with assistance of Dragon voice recognition software. Occasional wrong-word or sound-a-like substitutions may have occurred due to the inherent limitations of voice recognition software

## 2019-06-27 ENCOUNTER — Ambulatory Visit: Payer: No Typology Code available for payment source | Admitting: Gastroenterology

## 2019-07-08 ENCOUNTER — Encounter

## 2019-07-08 ENCOUNTER — Encounter: Payer: Self-pay | Admitting: Gastroenterology

## 2019-07-08 ENCOUNTER — Ambulatory Visit: Payer: PRIVATE HEALTH INSURANCE | Admitting: Gastroenterology

## 2019-07-08 VITALS — BP 128/80 | HR 82 | Temp 98.1°F | Ht 63.0 in | Wt 124.0 lb

## 2019-07-08 DIAGNOSIS — K219 Gastro-esophageal reflux disease without esophagitis: Secondary | ICD-10-CM

## 2019-07-08 DIAGNOSIS — Z1211 Encounter for screening for malignant neoplasm of colon: Secondary | ICD-10-CM

## 2019-07-08 DIAGNOSIS — R1310 Dysphagia, unspecified: Secondary | ICD-10-CM

## 2019-07-08 MED ORDER — PANTOPRAZOLE SODIUM 40 MG PO TBEC: 40.0000 mg | DELAYED_RELEASE_TABLET | Freq: Two times a day (BID) | ORAL | 1 refills | Status: DC

## 2019-07-08 NOTE — Progress Notes (Signed)
Chief Complaint: GERD, dysphagia  Referring Provider:     Willow Ora, MD  HPI:    [name] is a 48 y.o. female with a history of hypertension, anxiety, referred to the [facility] for evaluation of reflux.  She has a longstanding history of reflux, characterized as regurgitation, sourbrash. Worse in early AM and post prandial. No nocturnal sxs. Does have dysphagia to solids over the last couple of months, pointing to suprasternal notch.  No food impactions.  Intermittent dry cough.   Sxs present for years, but worse in the last several months. Had been taking omeprazole 20 mg/day, but reports her reflux was actually worse and n/v when taking medications despite 2 week trial. N/v resolved and improved appetite since stopping Prilosec. Takes Tums and Pepcid prn with relief of sxs.   Otherwise, no abdominal pain, hematochezia, melena, night sweats, f/c. Has intentionally lost 8# to try to limit need for BP meds.   No previous EGD or colonoscopy.  Normal CBC and BMP in 03/2019.  No known family history of CRC, GI malignancy, liver disease, pancreatic disease, or IBD.   Past Medical History:  Diagnosis Date  . Anxiety   . CIN I (cervical intraepithelial neoplasia I)    1999-LEEP, normal Paps after   . Depression   . Essential hypertension 05/08/2019  . Family history of premature CAD 11/20/2018   Mom  . H/O gestational diabetes mellitus, not currently pregnant    GESTATIONAL    . Hypertension      Past Surgical History:  Procedure Laterality Date  . AUGMENTATION MAMMAPLASTY  2005  . CERVICAL BIOPSY  W/ LOOP ELECTRODE EXCISION  1999   CIN I   Family History  Problem Relation Age of Onset  . Diabetes Mother   . Heart disease Mother   . Hypertension Mother   . Hyperlipidemia Mother   . Lung cancer Mother   . Cancer Father        RENAL CELL  . Renal cancer Father   . Heart disease Maternal Grandmother   . Heart disease Paternal Grandmother    . Hypertension Paternal Grandmother   . Heart disease Paternal Grandfather   . Prostate cancer Paternal Grandfather   . Healthy Daughter   . Epilepsy Son   . Learning disabilities Maternal Aunt   . Diabetes Brother   . Cerebral aneurysm Maternal Grandfather   . Colon cancer Neg Hx    Social History   Tobacco Use  . Smoking status: Never Smoker  . Smokeless tobacco: Never Used  Substance Use Topics  . Alcohol use: Yes    Comment: occassionaly  . Drug use: No   Current Outpatient Medications  Medication Sig Dispense Refill  . ALPRAZolam (XANAX) 0.25 MG tablet Take 1 tablet (0.25 mg total) by mouth at bedtime as needed for anxiety. 30 tablet 1  . levonorgestrel-ethinyl estradiol (AVIANE,ALESSE,LESSINA) 0.1-20 MG-MCG tablet Take 1 tablet by mouth daily. 3 Package 3  . metoprolol succinate (TOPROL-XL) 25 MG 24 hr tablet Take 1 tablet (25 mg total) by mouth daily.     No current facility-administered medications for this visit.    No Known Allergies   Review of Systems: All systems reviewed and negative except where noted in HPI.     Physical Exam:    Wt Readings from Last 3 Encounters:  07/08/19 124 lb (56.2 kg)  06/18/19 124 lb 12.8 oz (56.6 kg)  05/23/19 126 lb 3.2 oz (57.2 kg)    BP 128/80 (BP Location: Left Arm, Patient Position: Sitting)   Pulse 82   Temp 98.1 F (36.7 C)   Ht 5\' 3"  (1.6 m)   Wt 124 lb (56.2 kg)   SpO2 95%   BMI 21.97 kg/m  Constitutional:  Pleasant, in no acute distress. Psychiatric: Normal mood and affect. Behavior is normal. EENT: Pupils normal.  Conjunctivae are normal. No scleral icterus. Neck supple. No cervical LAD. Cardiovascular: Normal rate, regular rhythm. No edema Pulmonary/chest: Effort normal and breath sounds normal. No wheezing, rales or rhonchi. Abdominal: Soft, nondistended, nontender. Bowel sounds active throughout. There are no masses palpable. No hepatomegaly. Neurological: Alert and oriented to person place and  time. Skin: Skin is warm and dry. No rashes noted.   ASSESSMENT AND PLAN;   1) GERD 2) Dysphagia -Protonix 40 mg p.o. twice daily for diagnostic/therapeutic intent pending response to therapy, can titrate to lowest effective dose -EGD with possible dilation -Briefly discussed additional treatment options for reflux, to include antireflux surgical procedures, namely TIF.  Can discuss in further detail pending endoscopic findings and response to therapy.  3) CRC screening: -Due for age-appropriate CRC screening this year (turns 86 in 2 months).  Offered colonoscopy at the same time as EGD, but she would like to address her reflux issues first, then we will schedule colonoscopy for routine screening.  The indications, risks, and benefits of EGD were explained to the patient in detail. Risks include but are not limited to bleeding, perforation, adverse reaction to medications, and cardiopulmonary compromise. Sequelae include but are not limited to the possibility of surgery, hositalization, and mortality. The patient verbalized understanding and wished to proceed. All questions answered, referred to scheduler. Further recommendations pending results of the exam.     Lavena Bullion, DO, FACG  07/08/2019, 10:40 AM   Leamon Arnt, MD

## 2019-07-08 NOTE — Patient Instructions (Addendum)
If you are age 50 or older, your body mass index should be between 23-30. Your Body mass index is 21.97 kg/m. If this is out of the aforementioned range listed, please consider follow up with your Primary Care Provider.  If you are age 65 or younger, your body mass index should be between 19-25. Your Body mass index is 21.97 kg/m. If this is out of the aformentioned range listed, please consider follow up with your Primary Care Provider.   To help prevent the possible spread of infection to our patients, communities, and staff; we will be implementing the following measures:  As of now we are not allowing any visitors/family members to accompany you to any upcoming appointments with [facility]. If you have any concerns about this please contact our office to discuss prior to the appointment.   You have been scheduled for an endoscopy. Please follow written instructions given to you at your visit today. If you use inhalers (even only as needed), please bring them with you on the day of your procedure. Your physician has requested that you go to www.startemmi.com and enter the access code given to you at your visit today. This web site gives a general overview about your procedure. However, you should still follow specific instructions given to you by our office regarding your preparation for the procedure.  Due to recent COVID-19 restrictions implemented by [employer] and state authorities and in an effort to keep both patients and staff as safe as possible, [place] requires COVID-19 testing prior to any scheduled endoscopic procedure. The testing center is located at [address] [place], [place] [address] in the [facility] [employer]  suite.  Your appointment has been scheduled for 10:00am on 07/23/2019.   Please bring your insurance cards to this appointment. You will require your COVID screen 2 business days prior to your  endoscopic procedure.  You are not required to quarantine after your screening.  You will only receive a phone call with the results if it is POSITIVE.  If you do not receive a call the day before your procedure you should begin your prep, if ordered, and you should report to the endo center for your procedure at your designated appointment arrival time ( one hour prior to the procedure time). There is no cost to you for the screening on the day of the swab.  [facility] will file with your insurance company for the testing.    You may receive an automated phone call prior to your procedure or have a message in your MyChart that you have an appointment for a BP/15 at the [facility], please disregard this message.  Your testing will be at the [place] [place] location.   If you are leaving [facility] travel [place] on [address], turn left onto [address], turn night onto [address] at the 1st stop light turn right, pass the [employer] on your right and proceed to [place] (white building).   We have sent the following medications to your pharmacy for you to pick up at your convenience: Protonix 40mg  twice daily.  Please call our office at [phone] to set up your 3 month follow up visit.  It was a pleasure to see you today!  Vito Cirigliano, D.O.

## 2019-07-25 ENCOUNTER — Encounter: Payer: PRIVATE HEALTH INSURANCE | Admitting: Gastroenterology

## 2019-08-01 ENCOUNTER — Other Ambulatory Visit: Payer: Self-pay | Admitting: Family Medicine

## 2019-08-05 ENCOUNTER — Encounter: Payer: Self-pay | Admitting: Women's Health

## 2019-08-06 ENCOUNTER — Encounter: Payer: PRIVATE HEALTH INSURANCE | Admitting: Gastroenterology

## 2019-08-26 ENCOUNTER — Other Ambulatory Visit: Payer: Self-pay

## 2019-08-27 ENCOUNTER — Ambulatory Visit (INDEPENDENT_AMBULATORY_CARE_PROVIDER_SITE_OTHER): Payer: No Typology Code available for payment source | Admitting: Family Medicine

## 2019-08-27 ENCOUNTER — Ambulatory Visit: Payer: No Typology Code available for payment source | Admitting: Family Medicine

## 2019-08-27 ENCOUNTER — Encounter: Payer: Self-pay | Admitting: Family Medicine

## 2019-08-27 VITALS — BP 134/84 | HR 95 | Temp 97.2°F | Ht 63.0 in | Wt 122.0 lb

## 2019-08-27 DIAGNOSIS — F419 Anxiety disorder, unspecified: Secondary | ICD-10-CM | POA: Diagnosis not present

## 2019-08-27 DIAGNOSIS — I10 Essential (primary) hypertension: Secondary | ICD-10-CM

## 2019-08-27 DIAGNOSIS — G5603 Carpal tunnel syndrome, bilateral upper limbs: Secondary | ICD-10-CM

## 2019-08-27 DIAGNOSIS — H1132 Conjunctival hemorrhage, left eye: Secondary | ICD-10-CM | POA: Diagnosis not present

## 2019-08-27 DIAGNOSIS — K219 Gastro-esophageal reflux disease without esophagitis: Secondary | ICD-10-CM | POA: Diagnosis not present

## 2019-08-27 NOTE — Patient Instructions (Addendum)
Please follow up as scheduled for your next visit with me: 11/10/2019   If you have any questions or concerns, please don't hesitate to send me a message via MyChart or call the office at [phone]. Thank you for visiting with [place] today! It's our pleasure caring for you.  Subconjunctival Hemorrhage Subconjunctival hemorrhage is bleeding that happens between the white part of your eye (sclera) and the clear membrane that covers the outside of your eye (conjunctiva). There are many tiny blood vessels near the surface of your eye. A subconjunctival hemorrhage happens when one or more of these vessels breaks and bleeds, causing a red patch to appear on your eye. This is similar to a bruise. Depending on the amount of bleeding, the red patch may only cover a small area of your eye or it may cover the entire visible part of the sclera. If a lot of blood collects under the conjunctiva, there may also be swelling. Subconjunctival hemorrhages do not affect your vision or cause pain, but your eye may feel irritated if there is swelling. Subconjunctival hemorrhages usually do not require treatment, and they usually disappear on their own within two weeks. What are the causes? This condition may be caused by:  Mild trauma, such as rubbing your eye too hard.  Blunt injuries, such as from playing sports or having contact with a deployed airbag.  Coughing, sneezing, or vomiting.  Straining, such as when lifting a heavy object.  High blood pressure.  Recent eye surgery.  Diabetes.  Certain medicines, especially blood thinners (anticoagulants).  Other conditions, such as eye tumors, bleeding disorders, or blood vessel abnormalities. Subconjunctival hemorrhages can also happen without an obvious cause. What are the signs or symptoms? Symptoms of this condition include:  A bright red or dark red patch on the white part of the eye. The red area may: ? Spread out to cover a larger area of the eye before  it goes away. ? Turn brownish-yellow before it goes away.  Swelling around the eye.  Mild eye irritation. How is this diagnosed? This condition is diagnosed with a physical exam. If your subconjunctival hemorrhage was caused by trauma, your health care provider may refer you to an eye specialist (ophthalmologist) or another specialist to check for other injuries. You may have other tests, including:  An eye exam.  A blood pressure check.  Blood tests to check for bleeding disorders. If your subconjunctival hemorrhage was caused by trauma, X-rays or a CT scan may be done to check for other injuries. How is this treated? Usually, treatment is not needed for this condition. If you have discomfort, your health care provider may recommend eye drops or cold compresses. Follow these instructions at home:  Take over-the-counter and prescription medicines only as directed by your health care provider.  Use eye drops or cold compresses to help with discomfort as directed by your health care provider.  Avoid activities, things, and environments that may irritate or injure your eye.  Keep all follow-up visits as told by your health care provider. This is important. Contact a health care provider if:  You have pain in your eye.  The bleeding does not go away within 3 weeks.  You keep getting new subconjunctival hemorrhages. Get help right away if:  Your vision changes or you have difficulty seeing.  You suddenly develop severe sensitivity to light.  You develop a severe headache, persistent vomiting, confusion, or abnormal tiredness (lethargy).  Your eye seems to bulge or protrude from  your eye socket.  You develop unexplained bruises on your body.  You have unexplained bleeding in another area of your body. Summary  Subconjunctival hemorrhage is bleeding that happens between the white part of your eye and the clear membrane that covers the outside of your eye.  This condition is  similar to a bruise.  Subconjunctival hemorrhages usually do not require treatment, and they usually disappear on their own within two weeks.  Use eye drops or cold compresses to help with discomfort as directed by your health care provider. This information is not intended to replace advice given to you by your health care provider. Make sure you discuss any questions you have with your health care provider. Document Released: 09/04/2005 Document Revised: 06/05/2018 Document Reviewed: 06/05/2018 Elsevier Patient Education  2020 [employer].

## 2019-08-27 NOTE — Progress Notes (Signed)
Subjective  CC:  Chief Complaint  Patient presents with  . Broken Blood Vessel    Left Eye    HPI: [name] is a 48 y.o. female who presents to the office today to address the problems listed above in the chief complaint.  Noted blood in corner of left eye: no pain. No vision change. Has had some allergic itching with rubbing the eye but no other trauma, pain, drainage and no coughing/sneezing or vomiting.   GERD is improved; can't tolerate the PPIs; has seen GI but can't get EGD due to insurance denial. Eating better and has less sxs but on occasion still feels like food gets "stuck".  Anxiety: stable. Hasn't been able to see CBT/therapist due to covid but still is planning on it when things get better next year.  CTS: improved with splints. No weakness  HTN: remains well controlled. Feeling well. Taking medications w/o adverse effects. No symptoms of CHF, angina; no palpitations, sob, cp or lower extremity edema. Compliant with meds.    Assessment  1. Subconjunctival hemorrhage of left eye   2. Gastroesophageal reflux disease, unspecified whether esophagitis present   3. Anxiety   4. Carpal tunnel syndrome, bilateral   5. Essential hypertension      Plan   Subconjunctival hemorrhage:  Reassured. See AVS  GERD: tums and diet changes. Will f/u with GI if worsens for EGD. No PPIs due to intolerance  Anxiety: much improved overall. Less somatic complaints and worry. Rare xanax use. Will hope for therapy availability next year.   CTS: splints as needed. Improved  HTN: This medical condition is well controlled. There are no signs of complications, medication side effects, or red flags. Patient is instructed to continue the current treatment plan without change in therapies or medications.    Follow up:   11/10/2019 for bp f/u  No orders of the defined types were placed in this encounter.  No orders of the defined types were placed in this encounter.     I  reviewed the patients updated PMH, FH, and SocHx.    Patient Active Problem List   Diagnosis Date Noted  . Essential hypertension 05/08/2019    Priority: High  . Family history of premature CAD 11/20/2018    Priority: High  . Anxiety 06/27/2012    Priority: High  . Gastroesophageal reflux disease 06/18/2019    Priority: Medium  . Carpal tunnel syndrome, bilateral 06/18/2019    Priority: Medium  . Allergic rhinitis 12/17/2012    Priority: Low   Current Meds  Medication Sig  . ALPRAZolam (XANAX) 0.25 MG tablet Take 1 tablet (0.25 mg total) by mouth at bedtime as needed for anxiety.  Marland Kitchen levonorgestrel-ethinyl estradiol (AVIANE,ALESSE,LESSINA) 0.1-20 MG-MCG tablet Take 1 tablet by mouth daily.  . metoprolol succinate (TOPROL-XL) 25 MG 24 hr tablet Take 1 tablet (25 mg total) by mouth daily.  . pantoprazole (PROTONIX) 40 MG tablet Take 1 tablet (40 mg total) by mouth 2 (two) times daily.    Allergies: Patient has No Known Allergies. Family History: Patient family history includes Cancer in her father; Cerebral aneurysm in her maternal grandfather; Diabetes in her brother and mother; Epilepsy in her son; Healthy in her daughter; Heart disease in her maternal grandmother, mother, paternal grandfather, and paternal grandmother; Hyperlipidemia in her mother; Hypertension in her mother and paternal grandmother; Learning disabilities in her maternal aunt; Lung cancer in her mother; Prostate cancer in her paternal grandfather; Renal cancer in her father. Social History:  Patient  reports that she has never smoked. She has never used smokeless tobacco. She reports current alcohol use. She reports that she does not use drugs.  Review of Systems: Constitutional: Negative for fever malaise or anorexia Cardiovascular: negative for chest pain Respiratory: negative for SOB or persistent cough Gastrointestinal: negative for abdominal pain  Objective  Vitals: BP 134/84 (BP Location: Left Arm,  Patient Position: Sitting, Cuff Size: Normal)   Pulse 95   Temp (!) 97.2 F (36.2 C) (Oral)   Ht 5\' 3"  (1.6 m)   Wt 122 lb (55.3 kg)   SpO2 100%   BMI 21.61 kg/m  General: no acute distress , A&Ox3 HEENT: PEERL, conjunctiva normal on right but lateral edge of left eye with moderate subcon hemorrahge, no photophobia Cardiovascular:  RRR without murmur or gallop.  Respiratory:  Good breath sounds bilaterally, CTAB with normal respiratory effort Skin:  Warm, no rashes     Commons side effects, risks, benefits, and alternatives for medications and treatment plan prescribed today were discussed, and the patient expressed understanding of the given instructions. Patient is instructed to call or message via MyChart if he/she has any questions or concerns regarding our treatment plan. No barriers to understanding were identified. We discussed Red Flag symptoms and signs in detail. Patient expressed understanding regarding what to do in case of urgent or emergency type symptoms.   Medication list was reconciled, printed and provided to the patient in AVS. Patient instructions and summary information was reviewed with the patient as documented in the AVS. This note was prepared with assistance of Dragon voice recognition software. Occasional wrong-word or sound-a-like substitutions may have occurred due to the inherent limitations of voice recognition software  This visit occurred during the SARS-CoV-2 public health emergency.  Safety protocols were in place, including screening questions prior to the visit, additional usage of staff PPE, and extensive cleaning of exam room while observing appropriate contact time as indicated for disinfecting solutions.

## 2019-09-01 ENCOUNTER — Encounter: Payer: Self-pay | Admitting: Family Medicine

## 2019-11-04 ENCOUNTER — Other Ambulatory Visit: Payer: Self-pay | Admitting: Women's Health

## 2019-11-04 ENCOUNTER — Telehealth: Payer: Self-pay | Admitting: *Deleted

## 2019-11-04 DIAGNOSIS — F411 Generalized anxiety disorder: Secondary | ICD-10-CM

## 2019-11-04 MED ORDER — ALPRAZOLAM 0.25 MG PO TABS: 0.2500 mg | ORAL_TABLET | Freq: Every evening | ORAL | 0 refills | Status: DC | PRN

## 2019-11-04 MED ORDER — LEVONORGESTREL-ETHINYL ESTRAD 0.1-20 MG-MCG PO TABS: 1.0000 | ORAL_TABLET | Freq: Every day | ORAL | 0 refills | Status: DC

## 2019-11-04 NOTE — Telephone Encounter (Signed)
Okay for both

## 2019-11-04 NOTE — Telephone Encounter (Signed)
Patient called requesting refill on birth control pills, and Xanax 0.25 mg tablet. Annual exam scheduled on 12/15/19. I will send for pills, okay to refill Xanax as well?

## 2019-11-04 NOTE — Telephone Encounter (Signed)
Rx called in for Xanax 0.25 mg tablet,

## 2019-11-10 ENCOUNTER — Other Ambulatory Visit: Payer: Self-pay

## 2019-11-10 ENCOUNTER — Ambulatory Visit (INDEPENDENT_AMBULATORY_CARE_PROVIDER_SITE_OTHER): Payer: No Typology Code available for payment source | Admitting: Family Medicine

## 2019-11-10 ENCOUNTER — Encounter: Payer: Self-pay | Admitting: Family Medicine

## 2019-11-10 ENCOUNTER — Ambulatory Visit: Payer: No Typology Code available for payment source | Admitting: Family Medicine

## 2019-11-10 VITALS — BP 132/88 | HR 88 | Temp 97.2°F | Resp 15 | Wt 122.8 lb

## 2019-11-10 DIAGNOSIS — I10 Essential (primary) hypertension: Secondary | ICD-10-CM | POA: Diagnosis not present

## 2019-11-10 DIAGNOSIS — Z3041 Encounter for surveillance of contraceptive pills: Secondary | ICD-10-CM | POA: Insufficient documentation

## 2019-11-10 DIAGNOSIS — U071 COVID-19: Secondary | ICD-10-CM | POA: Diagnosis not present

## 2019-11-10 DIAGNOSIS — K219 Gastro-esophageal reflux disease without esophagitis: Secondary | ICD-10-CM

## 2019-11-10 DIAGNOSIS — F419 Anxiety disorder, unspecified: Secondary | ICD-10-CM

## 2019-11-10 MED ORDER — METOPROLOL SUCCINATE ER 25 MG PO TB24: 25.0000 mg | ORAL_TABLET | Freq: Every day | ORAL | 3 refills | Status: DC

## 2019-11-10 NOTE — Patient Instructions (Addendum)
Please return in June 2021 for your annual complete physical; please come fasting.   If you have any questions or concerns, please don't hesitate to send me a message via MyChart or call the office at [phone]. Thank you for visiting with [name] today! It's our pleasure caring for you.   Hypertension, Adult High blood pressure (hypertension) is when the force of blood pumping through the arteries is too strong. The arteries are the blood vessels that carry blood from the heart throughout the body. Hypertension forces the heart to work harder to pump blood and may cause arteries to become narrow or stiff. Untreated or uncontrolled hypertension can cause a heart attack, heart failure, a stroke, kidney disease, and other problems. A blood pressure reading consists of a higher number over a lower number. Ideally, your blood pressure should be below 120/80. The first ("top") number is called the systolic pressure. It is a measure of the pressure in your arteries as your heart beats. The second ("bottom") number is called the diastolic pressure. It is a measure of the pressure in your arteries as the heart relaxes. What are the causes? The exact cause of this condition is not known. There are some conditions that result in or are related to high blood pressure. What increases the risk? Some risk factors for high blood pressure are under your control. The following factors may make you more likely to develop this condition:  Smoking.  Having type 2 diabetes mellitus, high cholesterol, or both.  Not getting enough exercise or physical activity.  Being overweight.  Having too much fat, sugar, calories, or salt (sodium) in your diet.  Drinking too much alcohol. Some risk factors for high blood pressure may be difficult or impossible to change. Some of these factors include:  Having chronic kidney disease.  Having a family history of high blood pressure.  Age. Risk increases with age.  Race.  You may be at higher risk if you are African American.  Gender. Men are at higher risk than women before age 68. After age 71, women are at higher risk than men.  Having obstructive sleep apnea.  Stress. What are the signs or symptoms? High blood pressure may not cause symptoms. Very high blood pressure (hypertensive crisis) may cause:  Headache.  Anxiety.  Shortness of breath.  Nosebleed.  Nausea and vomiting.  Vision changes.  Severe chest pain.  Seizures. How is this diagnosed? This condition is diagnosed by measuring your blood pressure while you are seated, with your arm resting on a flat surface, your legs uncrossed, and your feet flat on the floor. The cuff of the blood pressure monitor will be placed directly against the skin of your upper arm at the level of your heart. It should be measured at least twice using the same arm. Certain conditions can cause a difference in blood pressure between your right and left arms. Certain factors can cause blood pressure readings to be lower or higher than normal for a short period of time:  When your blood pressure is higher when you are in a health care provider's office than when you are at home, this is called white coat hypertension. Most people with this condition do not need medicines.  When your blood pressure is higher at home than when you are in a health care provider's office, this is called masked hypertension. Most people with this condition may need medicines to control blood pressure. If you have a high blood pressure reading during one  visit or you have normal blood pressure with other risk factors, you may be asked to:  Return on a different day to have your blood pressure checked again.  Monitor your blood pressure at home for 1 week or longer. If you are diagnosed with hypertension, you may have other blood or imaging tests to help your health care provider understand your overall risk for other conditions. How  is this treated? This condition is treated by making healthy lifestyle changes, such as eating healthy foods, exercising more, and reducing your alcohol intake. Your health care provider may prescribe medicine if lifestyle changes are not enough to get your blood pressure under control, and if:  Your systolic blood pressure is above 130.  Your diastolic blood pressure is above 80. Your personal target blood pressure may vary depending on your medical conditions, your age, and other factors. Follow these instructions at home: Eating and drinking   Eat a diet that is high in fiber and potassium, and low in sodium, added sugar, and fat. An example eating plan is called the DASH (Dietary Approaches to Stop Hypertension) diet. To eat this way: ? Eat plenty of fresh fruits and vegetables. Try to fill one half of your plate at each meal with fruits and vegetables. ? Eat whole grains, such as whole-wheat pasta, brown rice, or whole-grain bread. Fill about one fourth of your plate with whole grains. ? Eat or drink low-fat dairy products, such as skim milk or low-fat yogurt. ? Avoid fatty cuts of meat, processed or cured meats, and poultry with skin. Fill about one fourth of your plate with lean proteins, such as fish, chicken without skin, beans, eggs, or tofu. ? Avoid pre-made and processed foods. These tend to be higher in sodium, added sugar, and fat.  Reduce your daily sodium intake. Most people with hypertension should eat less than 1,500 mg of sodium a day.  Do not drink alcohol if: ? Your health care provider tells you not to drink. ? You are pregnant, may be pregnant, or are planning to become pregnant.  If you drink alcohol: ? Limit how much you use to:  0-1 drink a day for women.  0-2 drinks a day for men. ? Be aware of how much alcohol is in your drink. In the [place] one drink equals one 12 oz bottle of beer (355 mL), one 5 oz glass of wine (148 mL), or one 1 oz glass of hard liquor  (44 mL). Lifestyle   Work with your health care provider to maintain a healthy body weight or to lose weight. Ask what an ideal weight is for you.  Get at least 30 minutes of exercise most days of the week. Activities may include walking, swimming, or biking.  Include exercise to strengthen your muscles (resistance exercise), such as Pilates or lifting weights, as part of your weekly exercise routine. Try to do these types of exercises for 30 minutes at least 3 days a week.  Do not use any products that contain nicotine or tobacco, such as cigarettes, e-cigarettes, and chewing tobacco. If you need help quitting, ask your health care provider.  Monitor your blood pressure at home as told by your health care provider.  Keep all follow-up visits as told by your health care provider. This is important. Medicines  Take over-the-counter and prescription medicines only as told by your health care provider. Follow directions carefully. Blood pressure medicines must be taken as prescribed.  Do not skip doses of blood  pressure medicine. Doing this puts you at risk for problems and can make the medicine less effective.  Ask your health care provider about side effects or reactions to medicines that you should watch for. Contact a health care provider if you:  Think you are having a reaction to a medicine you are taking.  Have headaches that keep coming back (recurring).  Feel dizzy.  Have swelling in your ankles.  Have trouble with your vision. Get help right away if you:  Develop a severe headache or confusion.  Have unusual weakness or numbness.  Feel faint.  Have severe pain in your chest or abdomen.  Vomit repeatedly.  Have trouble breathing. Summary  Hypertension is when the force of blood pumping through your arteries is too strong. If this condition is not controlled, it may put you at risk for serious complications.  Your personal target blood pressure may vary  depending on your medical conditions, your age, and other factors. For most people, a normal blood pressure is less than 120/80.  Hypertension is treated with lifestyle changes, medicines, or a combination of both. Lifestyle changes include losing weight, eating a healthy, low-sodium diet, exercising more, and limiting alcohol. This information is not intended to replace advice given to you by your health care provider. Make sure you discuss any questions you have with your health care provider. Document Revised: 05/15/2018 Document Reviewed: 05/15/2018 Elsevier Patient Education  2020 [employer].

## 2019-11-10 NOTE — Progress Notes (Signed)
Subjective  CC:  Chief Complaint  Patient presents with  . Hypertension    HTN, COVID positive in Jan - still experiencing some dizziness and nauseous     HPI: [name] is a 49 y.o. female who presents to the office today to address the problems listed above in the chief complaint.  Hypertension f/u: Control is good . Pt reports she is doing well. taking medications as instructed, no medication side effects noted, no TIAs, no chest pain on exertion, no dyspnea on exertion, no swelling of ankles. No palpitations. She denies adverse effects from his BP medications. Compliance with medication is good.   covid infection jan 21st; had mild case w/ loss of taste,smell and URI sxs. Has fatigue and reports some occ lightheadedness with mild nausea still. No vomiting or diarrhea. No abdominal pain. No vertigo. No headaches or vision changes. At times, likely not hydrating well. No h/o low BP and reports home readings are stable. Her boyfriend was hospitalized x 8 days due to covid. He is home recovering well.   GERD: intolerant to PPIs. Takes pepcid AC prn. Trying to manage behaviorally with diet changes. No new sxs. Has seen GI.   Anxiety: hasn't used xanax since aug 2020! occ mild panic attack but breaths through it. Working on [employer] and doing better.   Assessment  1. Essential hypertension   2. COVID-19 virus infection   3. Gastroesophageal reflux disease, unspecified whether esophagitis present   4. Anxiety      Plan    Hypertension f/u: BP control is well controlled. Continue low dose bb.   GERD f/u: prn pepcid controlling sxs now. Avoid ppis due to side effects  Covid infeciton w/ mild residual sxs. Reassured.   Anxiety: improved with rare xanax use.  Education regarding management of these chronic disease states was given. Management strategies discussed on successive visits include dietary and exercise recommendations, goals of achieving and maintaining IBW, and  lifestyle modifications aiming for adequate sleep and minimizing stressors.   Follow up: Return in about 4 months (around 03/09/2020) for complete physical, follow up Hypertension.  No orders of the defined types were placed in this encounter.  Meds ordered this encounter  Medications  . metoprolol succinate (TOPROL-XL) 25 MG 24 hr tablet    Sig: Take 1 tablet (25 mg total) by mouth daily.    Dispense:  90 tablet    Refill:  3      BP Readings from Last 3 Encounters:  11/10/19 132/88  08/27/19 134/84  07/08/19 128/80   Wt Readings from Last 3 Encounters:  11/10/19 122 lb 12.8 oz (55.7 kg)  08/27/19 122 lb (55.3 kg)  07/08/19 124 lb (56.2 kg)    Lab Results  Component Value Date   CHOL 158 02/26/2019   CHOL 147 04/20/2016   CHOL 154 01/13/2014   Lab Results  Component Value Date   HDL 46.10 02/26/2019   HDL 43 (L) 04/20/2016   HDL 54 01/13/2014   Lab Results  Component Value Date   LDLCALC 91 02/26/2019   LDLCALC 85 04/20/2016   LDLCALC 75 01/13/2014   Lab Results  Component Value Date   TRIG 106.0 02/26/2019   TRIG 93 04/20/2016   TRIG 125 01/13/2014   Lab Results  Component Value Date   CHOLHDL 3 02/26/2019   CHOLHDL 3.4 04/20/2016   CHOLHDL 2.9 01/13/2014   No results found for: LDLDIRECT Lab Results  Component Value Date   CREATININE 0.69  04/04/2019   BUN 11 04/04/2019   NA 138 04/04/2019   K 3.7 04/04/2019   CL 106 04/04/2019   CO2 22 04/04/2019    The 10-year ASCVD risk score Mikey Bussing DC Jr., et al., 2013) is: 1.6%   Values used to calculate the score:     Age: 72 years     Sex: Female     Is Non-Hispanic African American: No     Diabetic: No     Tobacco smoker: No     Systolic Blood Pressure: 099 mmHg     Is BP treated: Yes     HDL Cholesterol: 46.1 mg/dL     Total Cholesterol: 158 mg/dL  I reviewed the patients updated PMH, FH, and SocHx.    Patient Active Problem List   Diagnosis Date Noted  . Essential hypertension 05/08/2019     Priority: High  . Family history of premature CAD 11/20/2018    Priority: High  . Anxiety 06/27/2012    Priority: High  . Gastroesophageal reflux disease 06/18/2019    Priority: Medium  . Carpal tunnel syndrome, bilateral 06/18/2019    Priority: Medium  . Allergic rhinitis 12/17/2012    Priority: Low  . Oral contraceptive use 11/10/2019    Allergies: Patient has no known allergies.  Social History: Patient  reports that she has never smoked. She has never used smokeless tobacco. She reports current alcohol use. She reports that she does not use drugs.  Current Meds  Medication Sig  . ALPRAZolam (XANAX) 0.25 MG tablet Take 1 tablet (0.25 mg total) by mouth at bedtime as needed for anxiety.  Marland Kitchen levonorgestrel-ethinyl estradiol (ALESSE) 0.1-20 MG-MCG tablet Take 1 tablet by mouth daily.    Review of Systems: Cardiovascular: negative for chest pain, palpitations, leg swelling, orthopnea Respiratory: negative for SOB, wheezing or persistent cough Gastrointestinal: negative for abdominal pain Genitourinary: negative for dysuria or gross hematuria  Objective  Vitals: BP 132/88   Pulse 88   Temp (!) 97.2 F (36.2 C) (Temporal)   Resp 15   Wt 122 lb 12.8 oz (55.7 kg)   SpO2 99%   BMI 21.75 kg/m  General: no acute distress  Psych:  Alert and oriented, normal mood and affect HEENT:  Normocephalic, atraumatic, supple neck  Cardiovascular:  RRR without murmur. no edema Respiratory:  Good breath sounds bilaterally, CTAB with normal respiratory effort Skin:  Warm, no rashes Neurologic:   Mental status is normal  Commons side effects, risks, benefits, and alternatives for medications and treatment plan prescribed today were discussed, and the patient expressed understanding of the given instructions. Patient is instructed to call or message via MyChart if he/she has any questions or concerns regarding our treatment plan. No barriers to understanding were identified. We discussed Red  Flag symptoms and signs in detail. Patient expressed understanding regarding what to do in case of urgent or emergency type symptoms.   Medication list was reconciled, printed and provided to the patient in AVS. Patient instructions and summary information was reviewed with the patient as documented in the AVS. This note was prepared with assistance of Dragon voice recognition software. Occasional wrong-word or sound-a-like substitutions may have occurred due to the inherent limitations of voice recognition software  This visit occurred during the SARS-CoV-2 public health emergency.  Safety protocols were in place, including screening questions prior to the visit, additional usage of staff PPE, and extensive cleaning of exam room while observing appropriate contact time as indicated for disinfecting solutions.

## 2019-12-12 ENCOUNTER — Other Ambulatory Visit: Payer: Self-pay

## 2019-12-15 ENCOUNTER — Other Ambulatory Visit: Payer: Self-pay

## 2019-12-15 ENCOUNTER — Encounter: Payer: Self-pay | Admitting: Women's Health

## 2019-12-15 ENCOUNTER — Ambulatory Visit (INDEPENDENT_AMBULATORY_CARE_PROVIDER_SITE_OTHER): Payer: No Typology Code available for payment source | Admitting: Women's Health

## 2019-12-15 VITALS — BP 118/78 | Ht 63.0 in | Wt 126.0 lb

## 2019-12-15 DIAGNOSIS — Z01419 Encounter for gynecological examination (general) (routine) without abnormal findings: Secondary | ICD-10-CM

## 2019-12-15 DIAGNOSIS — F411 Generalized anxiety disorder: Secondary | ICD-10-CM

## 2019-12-15 MED ORDER — ALPRAZOLAM 0.25 MG PO TABS: 0.2500 mg | ORAL_TABLET | Freq: Every evening | ORAL | 0 refills | Status: DC | PRN

## 2019-12-15 MED ORDER — NORETHINDRONE 0.35 MG PO TABS: 1.0000 | ORAL_TABLET | Freq: Every day | ORAL | 4 refills | Status: DC

## 2019-12-15 NOTE — Progress Notes (Signed)
[name] 1971/05/28 [id number]    History:    Presents for annual exam.  Monthly cycle on Alesse, occasional breakthrough bleeding has had a negative sonohysterogram.  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  Normal mammogram history.  Primary care managing hypertension that was diagnosed 6 months ago has lost 10 pounds and is watching diet closely.  GERD.  Uses an occasional Xanax.  Same partner years.  Past medical history, past surgical history, family history and social history were all reviewed and documented in the EPIC chart.  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   [name] 11,  [name] 49 epilepsy, both doing well.  Father children minimal involvement history of substance abuse.  Partner has 3 children 14 through 74.  Parents deceased, mother complications of diabetes, father lung cancer.  GDM second pregnancy  ROS:  A ROS was performed and pertinent positives and negatives are included.  Exam:  Vitals:   12/15/19 1418  BP: 118/78  Weight: 126 lb (57.2 kg)  Height: 5\' 3"  (1.6 m)   Body mass index is 22.32 kg/m.   General appearance:  Normal Thyroid:  Symmetrical, normal in size, without palpable masses or nodularity. Respiratory  Auscultation:  Clear without wheezing or rhonchi Cardiovascular  Auscultation:  Regular rate, without rubs, murmurs or gallops  Edema/varicosities:  Not grossly evident Abdominal  Soft,nontender, without masses, guarding or rebound.  Liver/spleen:  No organomegaly noted  Hernia:  None appreciated  Skin  Inspection:  Grossly normal   Breasts: Examined lying and sitting. Bilateral implants    Right: Without masses, retractions, discharge or axillary adenopathy.     Left: Without masses, retractions, discharge or axillary adenopathy. Gentitourinary   Inguinal/mons:  Normal without inguinal adenopathy  External genitalia:  Normal  BUS/Urethra/Skene's glands:  Normal  Vagina:  Normal  Cervix:  Normal  Uterus:  normal in size, shape and  contour.  Midline and mobile  Adnexa/parametria:     Rt: Without masses or tenderness.   Lt: Without masses or tenderness.  Anus and perineum: Normal  Digital rectal exam: Normal sphincter tone without palpated masses or tenderness  Assessment/Plan:  49 y.o. D WF G3, P2 for annual exam with occasional breakthrough bleeding, negative sonohysterogram.  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  Reviewed combination OCs have a greater risk of blood clots and strokes with hypertension.  SBEs, continue annual screening mammograms, exercise, calcium rich foods, vitamin D 2000 IUs daily encouraged.  Congratulated on the 10 pound weight loss, healthy lifestyle and low-salt diet.  Screening colonoscopy reviewed, [facility] information given instructed to schedule.  Pap normal 2020, new screening guidelines reviewed.   06-08-1984 [facility], 4:01 PM 12/15/2019

## 2019-12-15 NOTE — Patient Instructions (Signed)
Colonoscopy [facility]  [phone]  Dr Leone Payor or Dr Margretta Sidle Vit D 2000 iu daily DASH Eating Plan DASH stands for "Dietary Approaches to Stop Hypertension." The DASH eating plan is a healthy eating plan that has been shown to reduce high blood pressure (hypertension). It may also reduce your risk for type 2 diabetes, heart disease, and stroke. The DASH eating plan may also help with weight loss. What are tips for following this plan?  General guidelines  Avoid eating more than 2,300 mg (milligrams) of salt (sodium) a day. If you have hypertension, you may need to reduce your sodium intake to 1,500 mg a day.  Limit alcohol intake to no more than 1 drink a day for nonpregnant women and 2 drinks a day for men. One drink equals 12 oz of beer, 5 oz of wine, or 1 oz of hard liquor.  Work with your health care provider to maintain a healthy body weight or to lose weight. Ask what an ideal weight is for you.  Get at least 30 minutes of exercise that causes your heart to beat faster (aerobic exercise) most days of the week. Activities may include walking, swimming, or biking.  Work with your health care provider or diet and nutrition specialist (dietitian) to adjust your eating plan to your individual calorie needs. Reading food labels   Check food labels for the amount of sodium per serving. Choose foods with less than 5 percent of the Daily Value of sodium. Generally, foods with less than 300 mg of sodium per serving fit into this eating plan.  To find whole grains, look for the word "whole" as the first word in the ingredient list. Shopping  Buy products labeled as "low-sodium" or "no salt added."  Buy fresh foods. Avoid canned foods and premade or frozen meals. Cooking  Avoid adding salt when cooking. Use salt-free seasonings or herbs instead of table salt or sea salt. Check with your health care provider or pharmacist before using salt substitutes.  Do not fry foods. Cook foods using  healthy methods such as baking, boiling, grilling, and broiling instead.  Cook with heart-healthy oils, such as olive, canola, soybean, or sunflower oil. Meal planning  Eat a balanced diet that includes: ? 5 or more servings of fruits and vegetables each day. At each meal, try to fill half of your plate with fruits and vegetables. ? Up to 6-8 servings of whole grains each day. ? Less than 6 oz of lean meat, poultry, or fish each day. A 3-oz serving of meat is about the same size as a deck of cards. One egg equals 1 oz. ? 2 servings of low-fat dairy each day. ? A serving of nuts, seeds, or beans 5 times each week. ? Heart-healthy fats. Healthy fats called Omega-3 fatty acids are found in foods such as flaxseeds and coldwater fish, like sardines, salmon, and mackerel.  Limit how much you eat of the following: ? Canned or prepackaged foods. ? Food that is high in trans fat, such as fried foods. ? Food that is high in saturated fat, such as fatty meat. ? Sweets, desserts, sugary drinks, and other foods with added sugar. ? Full-fat dairy products.  Do not salt foods before eating.  Try to eat at least 2 vegetarian meals each week.  Eat more home-cooked food and less restaurant, buffet, and fast food.  When eating at a restaurant, ask that your food be prepared with less salt or no salt, if possible. What foods  are recommended? The items listed may not be a complete list. Talk with your dietitian about what dietary choices are best for you. Grains Whole-grain or whole-wheat bread. Whole-grain or whole-wheat pasta. Brown rice. Modena Morrow. Bulgur. Whole-grain and low-sodium cereals. Pita bread. Low-fat, low-sodium crackers. Whole-wheat flour tortillas. Vegetables Fresh or frozen vegetables (raw, steamed, roasted, or grilled). Low-sodium or reduced-sodium tomato and vegetable juice. Low-sodium or reduced-sodium tomato sauce and tomato paste. Low-sodium or reduced-sodium canned  vegetables. Fruits All fresh, dried, or frozen fruit. Canned fruit in natural juice (without added sugar). Meat and other protein foods Skinless chicken or [place]. Ground chicken or [place]. Pork with fat trimmed off. Fish and seafood. Egg whites. Dried beans, peas, or lentils. Unsalted nuts, nut butters, and seeds. Unsalted canned beans. Lean cuts of beef with fat trimmed off. Low-sodium, lean deli meat. Dairy Low-fat (1%) or fat-free (skim) milk. Fat-free, low-fat, or reduced-fat cheeses. Nonfat, low-sodium ricotta or cottage cheese. Low-fat or nonfat yogurt. Low-fat, low-sodium cheese. Fats and oils Soft margarine without trans fats. Vegetable oil. Low-fat, reduced-fat, or light mayonnaise and salad dressings (reduced-sodium). Canola, safflower, olive, soybean, and sunflower oils. Avocado. Seasoning and other foods Herbs. Spices. Seasoning mixes without salt. Unsalted popcorn and pretzels. Fat-free sweets. What foods are not recommended? The items listed may not be a complete list. Talk with your dietitian about what dietary choices are best for you. Grains Baked goods made with fat, such as croissants, muffins, or some breads. Dry pasta or rice meal packs. Vegetables Creamed or fried vegetables. Vegetables in a cheese sauce. Regular canned vegetables (not low-sodium or reduced-sodium). Regular canned tomato sauce and paste (not low-sodium or reduced-sodium). Regular tomato and vegetable juice (not low-sodium or reduced-sodium). Angie Fava. Olives. Fruits Canned fruit in a light or heavy syrup. Fried fruit. Fruit in cream or butter sauce. Meat and other protein foods Fatty cuts of meat. Ribs. Fried meat. Berniece Salines. Sausage. Bologna and other processed lunch meats. Salami. Fatback. Hotdogs. Bratwurst. Salted nuts and seeds. Canned beans with added salt. Canned or smoked fish. Whole eggs or egg yolks. Chicken or [place] with skin. Dairy Whole or 2% milk, cream, and half-and-half. Whole or full-fat  cream cheese. Whole-fat or sweetened yogurt. Full-fat cheese. Nondairy creamers. Whipped toppings. Processed cheese and cheese spreads. Fats and oils Butter. Stick margarine. Lard. Shortening. Ghee. Bacon fat. Tropical oils, such as coconut, palm kernel, or palm oil. Seasoning and other foods Salted popcorn and pretzels. Onion salt, garlic salt, seasoned salt, table salt, and sea salt. Worcestershire sauce. Tartar sauce. Barbecue sauce. Teriyaki sauce. Soy sauce, including reduced-sodium. Steak sauce. Canned and packaged gravies. Fish sauce. Oyster sauce. Cocktail sauce. Horseradish that you find on the shelf. Ketchup. Mustard. Meat flavorings and tenderizers. Bouillon cubes. Hot sauce and Tabasco sauce. Premade or packaged marinades. Premade or packaged taco seasonings. Relishes. Regular salad dressings. Where to find more information:  National Heart, Lung, and [place]: https://wilson-eaton.com/  American Heart Association: www.heart.org Summary  The DASH eating plan is a healthy eating plan that has been shown to reduce high blood pressure (hypertension). It may also reduce your risk for type 2 diabetes, heart disease, and stroke.  With the DASH eating plan, you should limit salt (sodium) intake to 2,300 mg a day. If you have hypertension, you may need to reduce your sodium intake to 1,500 mg a day.  When on the DASH eating plan, aim to eat more fresh fruits and vegetables, whole grains, lean proteins, low-fat dairy, and heart-healthy fats.  Work with your  health care provider or diet and nutrition specialist (dietitian) to adjust your eating plan to your individual calorie needs. This information is not intended to replace advice given to you by your health care provider. Make sure you discuss any questions you have with your health care provider. Document Revised: 08/17/2017 Document Reviewed: 08/28/2016 Elsevier Patient Education  2020 [employer].  Health Maintenance, Female Adopting  a healthy lifestyle and getting preventive care are important in promoting health and wellness. Ask your health care provider about:  The right schedule for you to have regular tests and exams.  Things you can do on your own to prevent diseases and keep yourself healthy. What should I know about diet, weight, and exercise? Eat a healthy diet   Eat a diet that includes plenty of vegetables, fruits, low-fat dairy products, and lean protein.  Do not eat a lot of foods that are high in solid fats, added sugars, or sodium. Maintain a healthy weight Body mass index (BMI) is used to identify weight problems. It estimates body fat based on height and weight. Your health care provider can help determine your BMI and help you achieve or maintain a healthy weight. Get regular exercise Get regular exercise. This is one of the most important things you can do for your health. Most adults should:  Exercise for at least 150 minutes each week. The exercise should increase your heart rate and make you sweat (moderate-intensity exercise).  Do strengthening exercises at least twice a week. This is in addition to the moderate-intensity exercise.  Spend less time sitting. Even light physical activity can be beneficial. Watch cholesterol and blood lipids Have your blood tested for lipids and cholesterol at 51 years of age, then have this test every 5 years. Have your cholesterol levels checked more often if:  Your lipid or cholesterol levels are high.  You are older than 51 years of age.  You are at high risk for heart disease. What should I know about cancer screening? Depending on your health history and family history, you may need to have cancer screening at various ages. This may include screening for:  Breast cancer.  Cervical cancer.  Colorectal cancer.  Skin cancer.  Lung cancer. What should I know about heart disease, diabetes, and high blood pressure? Blood pressure and heart  disease  High blood pressure causes heart disease and increases the risk of stroke. This is more likely to develop in people who have high blood pressure readings, are of African descent, or are overweight.  Have your blood pressure checked: ? Every 3-5 years if you are 68-54 years of age. ? Every year if you are 51 years old or older. Diabetes Have regular diabetes screenings. This checks your fasting blood sugar level. Have the screening done:  Once every three years after age 44 if you are at a normal weight and have a low risk for diabetes.  More often and at a younger age if you are overweight or have a high risk for diabetes. What should I know about preventing infection? Hepatitis B If you have a higher risk for hepatitis B, you should be screened for this virus. Talk with your health care provider to find out if you are at risk for hepatitis B infection. Hepatitis C Testing is recommended for:  Everyone born from [birth year] through 1967.  Anyone with known risk factors for hepatitis C. Sexually transmitted infections (STIs)  Get screened for STIs, including gonorrhea and chlamydia, if: ? You are  sexually active and are younger than 51 years of age. ? You are older than 51 years of age and your health care provider tells you that you are at risk for this type of infection. ? Your sexual activity has changed since you were last screened, and you are at increased risk for chlamydia or gonorrhea. Ask your health care provider if you are at risk.  Ask your health care provider about whether you are at high risk for HIV. Your health care provider may recommend a prescription medicine to help prevent HIV infection. If you choose to take medicine to prevent HIV, you should first get tested for HIV. You should then be tested every 3 months for as long as you are taking the medicine. Pregnancy  If you are about to stop having your period (premenopausal) and you may become pregnant, seek  counseling before you get pregnant.  Take 400 to 800 micrograms (mcg) of folic acid every day if you become pregnant.  Ask for birth control (contraception) if you want to prevent pregnancy. Osteoporosis and menopause Osteoporosis is a disease in which the bones lose minerals and strength with aging. This can result in bone fractures. If you are 39 years old or older, or if you are at risk for osteoporosis and fractures, ask your health care provider if you should:  Be screened for bone loss.  Take a calcium or vitamin D supplement to lower your risk of fractures.  Be given hormone replacement therapy (HRT) to treat symptoms of menopause. Follow these instructions at home: Lifestyle  Do not use any products that contain nicotine or tobacco, such as cigarettes, e-cigarettes, and chewing tobacco. If you need help quitting, ask your health care provider.  Do not use street drugs.  Do not share needles.  Ask your health care provider for help if you need support or information about quitting drugs. Alcohol use  Do not drink alcohol if: ? Your health care provider tells you not to drink. ? You are pregnant, may be pregnant, or are planning to become pregnant.  If you drink alcohol: ? Limit how much you use to 0-1 drink a day. ? Limit intake if you are breastfeeding.  Be aware of how much alcohol is in your drink. In the [place] one drink equals one 12 oz bottle of beer (355 mL), one 5 oz glass of wine (148 mL), or one 1 oz glass of hard liquor (44 mL). General instructions  Schedule regular health, dental, and eye exams.  Stay current with your vaccines.  Tell your health care provider if: ? You often feel depressed. ? You have ever been abused or do not feel safe at home. Summary  Adopting a healthy lifestyle and getting preventive care are important in promoting health and wellness.  Follow your health care provider's instructions about healthy diet, exercising, and getting  tested or screened for diseases.  Follow your health care provider's instructions on monitoring your cholesterol and blood pressure. This information is not intended to replace advice given to you by your health care provider. Make sure you discuss any questions you have with your health care provider. Document Revised: 08/28/2018 Document Reviewed: 08/28/2018 Elsevier Patient Education  2020 [employer].

## 2019-12-17 LAB — URINALYSIS, COMPLETE W/RFL CULTURE
Bacteria, UA: NONE SEEN /HPF
Bilirubin Urine: NEGATIVE
Glucose, UA: NEGATIVE
Hgb urine dipstick: NEGATIVE
Hyaline Cast: NONE SEEN /LPF
Ketones, ur: NEGATIVE
Nitrites, Initial: NEGATIVE
Protein, ur: NEGATIVE
RBC / HPF: NONE SEEN /HPF (ref 0–2)
Specific Gravity, Urine: 1.007 (ref 1.001–1.03)
Squamous Epithelial / HPF: NONE SEEN /HPF (ref <=5)
WBC, UA: NONE SEEN /HPF (ref 0–5)
pH: 6.5 (ref 5.0–8.0)

## 2019-12-17 LAB — CULTURE INDICATED

## 2019-12-17 LAB — URINE CULTURE
MICRO NUMBER:: 10305450
SPECIMEN QUALITY:: ADEQUATE

## 2020-01-20 ENCOUNTER — Other Ambulatory Visit: Payer: Self-pay | Admitting: Women's Health

## 2020-02-17 ENCOUNTER — Telehealth: Payer: Self-pay

## 2020-02-17 ENCOUNTER — Other Ambulatory Visit: Payer: Self-pay | Admitting: Nurse Practitioner

## 2020-02-17 DIAGNOSIS — Z3041 Encounter for surveillance of contraceptive pills: Secondary | ICD-10-CM

## 2020-02-17 MED ORDER — LEVONORGESTREL-ETHINYL ESTRAD 0.1-20 MG-MCG PO TABS: 1.0000 | ORAL_TABLET | Freq: Every day | ORAL | 4 refills | Status: DC

## 2020-02-17 NOTE — Telephone Encounter (Signed)
I called patient and informed her. 

## 2020-02-17 NOTE — Telephone Encounter (Signed)
Yes that's fine. Sent to pharmacy.

## 2020-02-17 NOTE — Telephone Encounter (Signed)
Patient saw Veronica Sine, NP in March for annual exam and OC was changed from Alesse to Micronor.  Patient called today asking to go back on the Alesse. She said it worked better for her than the Micronor.

## 2020-03-23 ENCOUNTER — Ambulatory Visit: Payer: 59 | Admitting: Nurse Practitioner

## 2020-03-23 ENCOUNTER — Other Ambulatory Visit: Payer: Self-pay

## 2020-03-23 ENCOUNTER — Encounter: Payer: Self-pay | Admitting: Nurse Practitioner

## 2020-03-23 VITALS — BP 124/80

## 2020-03-23 DIAGNOSIS — N939 Abnormal uterine and vaginal bleeding, unspecified: Secondary | ICD-10-CM | POA: Diagnosis not present

## 2020-03-23 NOTE — Progress Notes (Signed)
   Acute Office Visit  Subjective:    Patient ID: [name], female    DOB: 08-12-1971, 49 y.o.   MRN: [id number]  Chief Complaint  Patient presents with  . Vaginal Bleeding    HPI 49 year old [id number] presents today for prolonged bleeding with her previous cycle. Has had breakthrough bleeding in the past with negative ultrasound/sonohysterogram in 2018. She has been on OCPs for many years for dysmenorrhea. March 2021 she began Micronor but was having more frequent bleeding and switched back to Alesse 1 month ago. LMP 03/06/2020 with heavy, painful bleeding and nausea. Pap 10/08/2018 normal. Complains of stress incontinence/urgency, this is not new for her and it was recommended she see urology a few years ago. Husband had vasectomy.    Review of Systems  Constitutional: Negative.   Gastrointestinal: Negative.   Genitourinary: Positive for menstrual problem and urgency. Negative for vaginal discharge and vaginal pain.       Incontinence  Skin: Negative.        Objective:    Physical Exam Constitutional:      Appearance: Normal appearance.  Abdominal:     Palpations: Abdomen is soft.     Tenderness: There is no abdominal tenderness.  Genitourinary:    General: Normal vulva.     Vagina: Normal.     Cervix: Normal.     Uterus: Normal.      BP 124/80 (BP Location: Right Arm, Patient Position: Sitting, Cuff Size: Normal)   LMP 03/06/2020  Wt Readings from Last 3 Encounters:  12/15/19 126 lb (57.2 kg)  11/10/19 122 lb 12.8 oz (55.7 kg)  08/27/19 122 lb (55.3 kg)        Assessment & Plan:   Problem List Items Addressed This Visit    None    Visit Diagnoses    Abnormal uterine bleeding    -  Primary      Plan: Normal exam today, not currently bleeding. Discussed options to include continuing current OCPs for a couple of months to see if the bleeding improves since she just switched back to combination pills 1 month ago, stopping OCPs due to risks related to age  and history of hypertension and seeing if her cycles improve, switching HRT, and performing ultrasound. We agreed to stop OCPs for now and if bleeding continues we will perform ultrasound and/or restart HRT. She is agreeable to plan.      Olivia Mackie [facility], 9:25 AM 03/23/2020

## 2020-05-11 ENCOUNTER — Encounter: Payer: No Typology Code available for payment source | Admitting: Family Medicine

## 2020-05-15 IMAGING — MR MRI HEAD WITHOUT CONTRAST
10 of 11 series · 43 of 48 positions shown · non-contrast
Comparison: None.

CLINICAL DATA: New onset tingling of the right arm and face today.

EXAM:
MRI HEAD WITHOUT CONTRAST
TECHNIQUE: Multiplanar, multiecho pulse sequences of the brain and surrounding
structures were obtained without intravenous contrast.

[Series 5: DWI · axial · 3.0mm · 0.88mm/px · z∈[-33,+113]mm · 8 of 100 slices shown (1 of 4)]
[im 1/100]
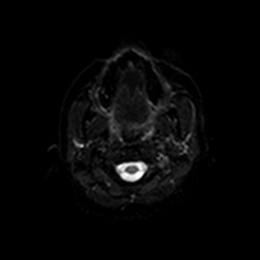
[im 15/100]
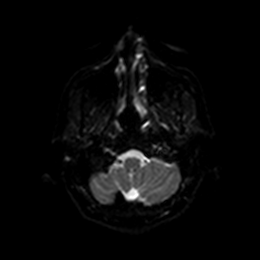
[im 29/100]
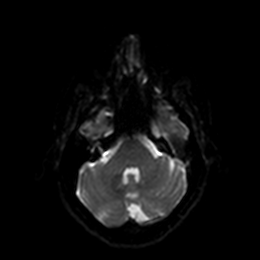
[im 43/100]
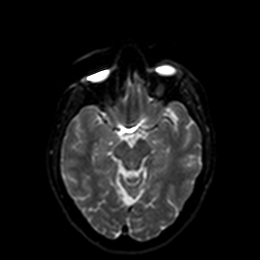
[im 57/100]
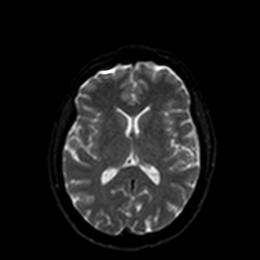
[im 71/100]
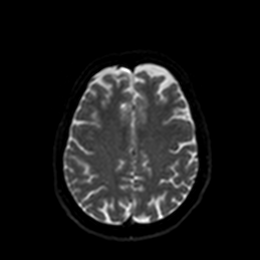
[im 85/100]
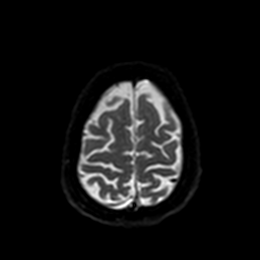
[im 100/100]
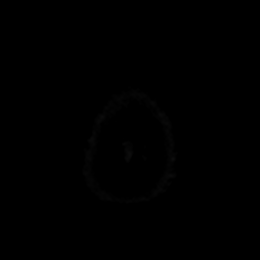

[Series 6: DWI · axial · 3.0mm · 0.88mm/px · z∈[-33,+113]mm · 5 of 50 slices shown (2 of 4)]
[im 1/50]
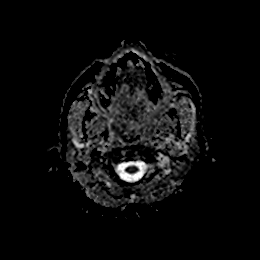
[im 13/50]
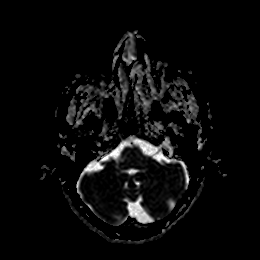
[im 25/50]
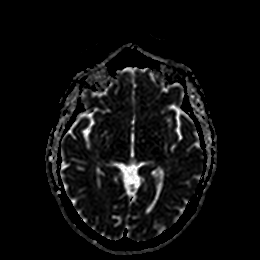
[im 37/50]
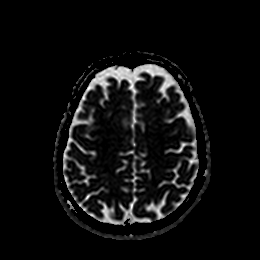
[im 50/50]
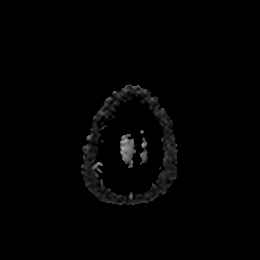

[Series 7: FLAIR · axial · 5.0mm · 0.45mm/px · z∈[-43,+111]mm · 3 of 27 slices shown]
[im 1/27]
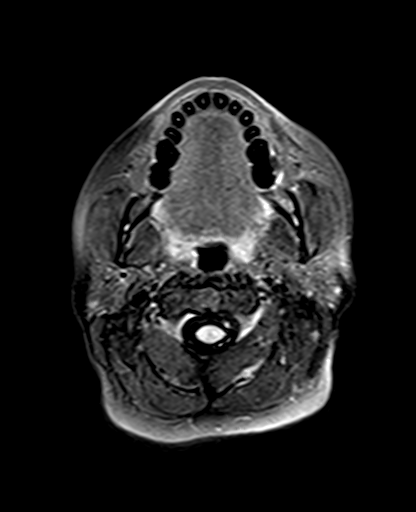
[im 14/27]
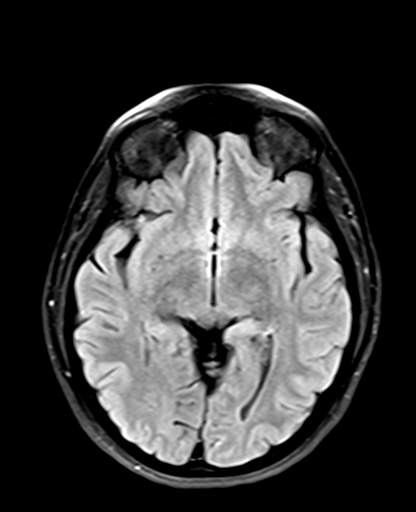
[im 27/27]
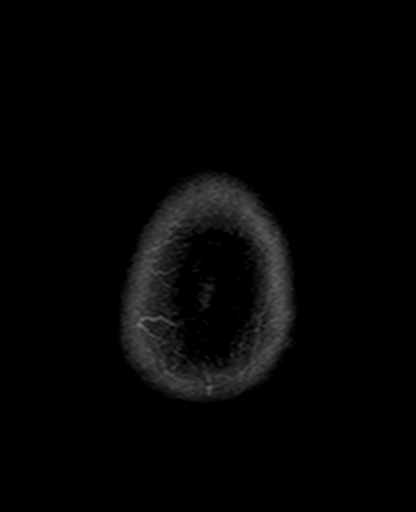

[Series 9: pha_images · axial · 3.0mm · 0.90mm/px · z∈[-39,+112]mm · 5 of 52 slices shown]
[im 1/52]
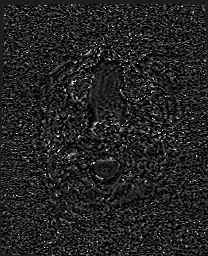
[im 13/52]
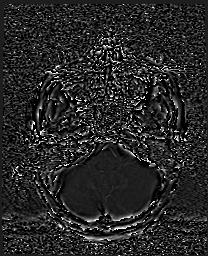
[im 26/52]
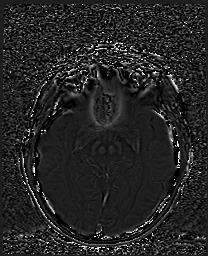
[im 39/52]
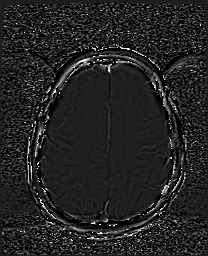
[im 52/52]
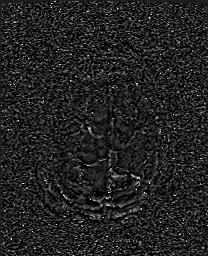

[Series 10: swi_images · axial · 3.0mm · 0.90mm/px · z∈[-39,+112]mm · 5 of 52 slices shown]
[im 1/52]
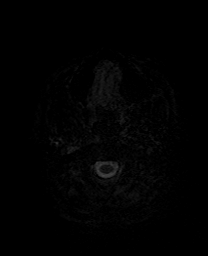
[im 13/52]
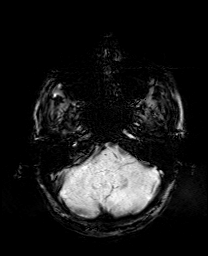
[im 26/52]
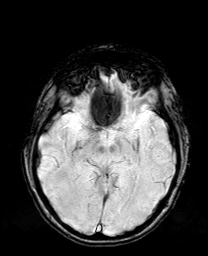
[im 39/52]
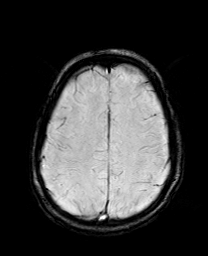
[im 52/52]
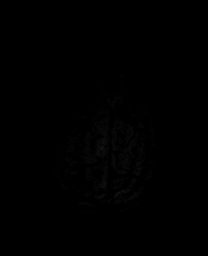

[Series 12: T2 · axial · 5.0mm · 0.72mm/px · z∈[-40,+114]mm · 3 of 27 slices shown (1 of 2)]
[im 1/27]
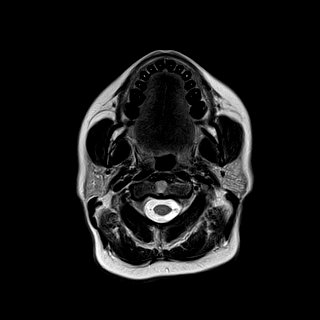
[im 14/27]
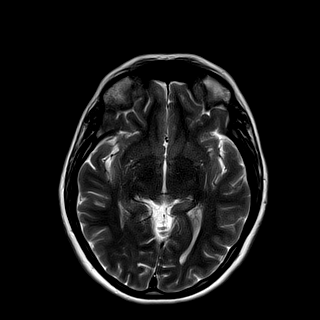
[im 27/27]
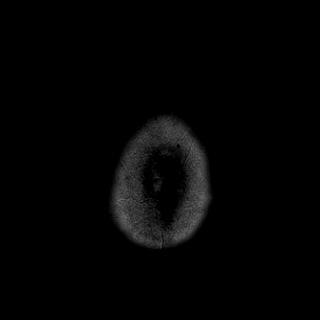

[Series 13: DWI · coronal · 4.0mm · 0.88mm/px · 6 of 64 slices shown (3 of 4)]
[im 1/64]
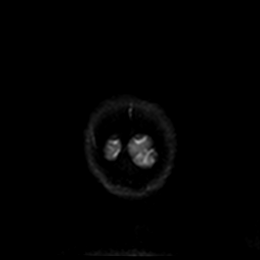
[im 13/64]
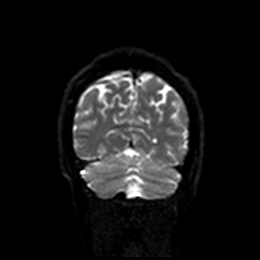
[im 26/64]
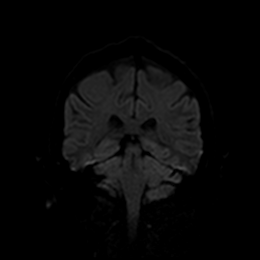
[im 38/64]
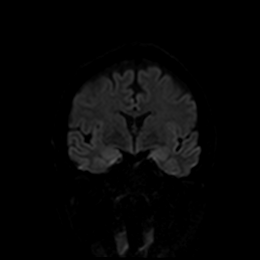
[im 51/64]
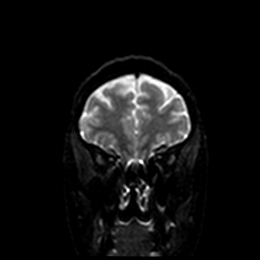
[im 64/64]
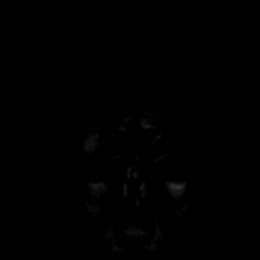

[Series 14: DWI · coronal · 4.0mm · 0.88mm/px · 3 of 32 slices shown (4 of 4)]
[im 1/32]
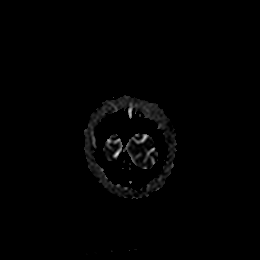
[im 16/32]
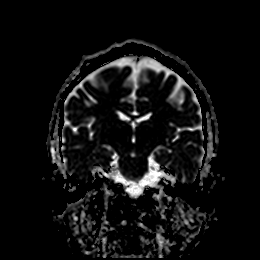
[im 32/32]
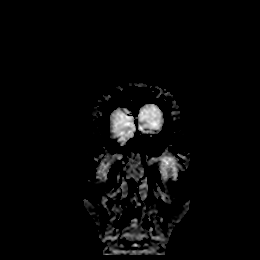

[Series 15: T1 · sagittal · 5.0mm · 0.75mm/px · 2 of 23 slices shown]
[im 1/23]
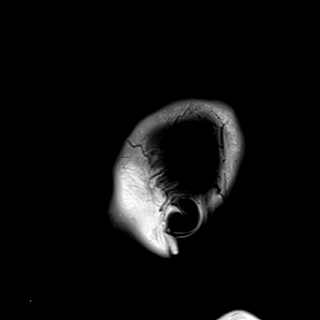
[im 23/23]
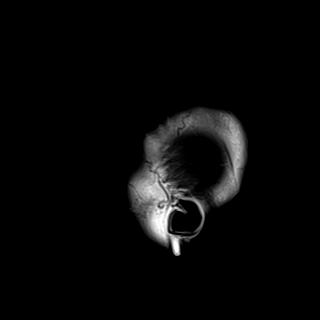

[Series 17: T2 · coronal · 5.0mm · 0.34mm/px · 3 of 27 slices shown (2 of 2)]
[im 1/27]
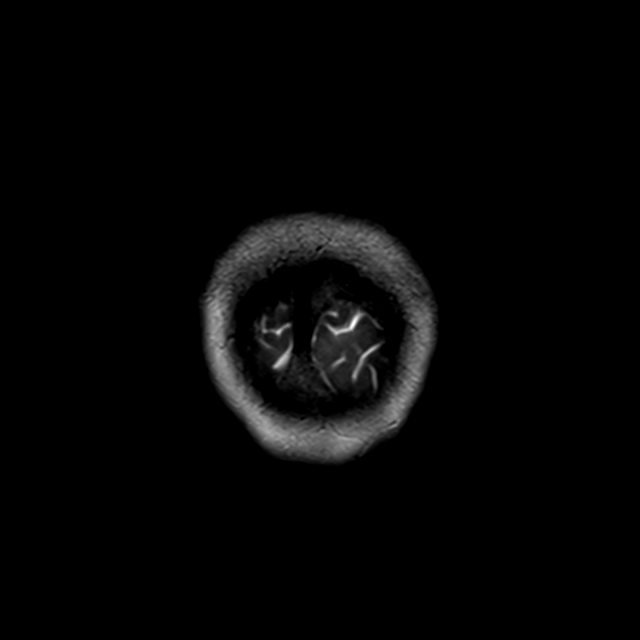
[im 14/27]
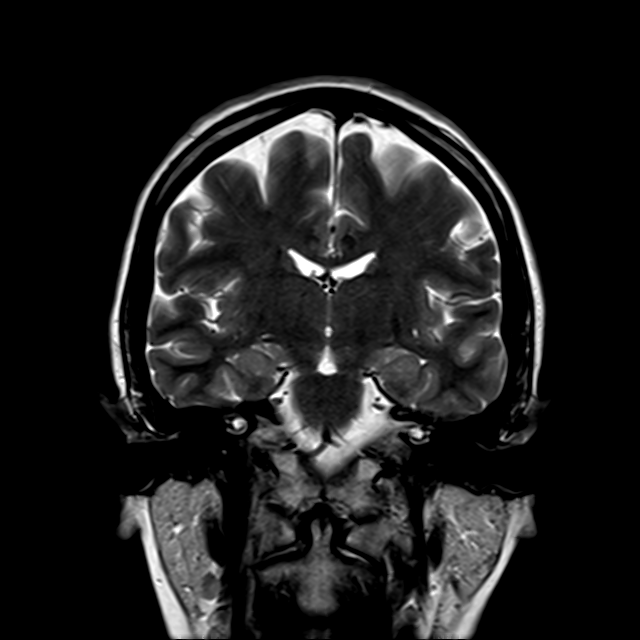
[im 27/27]
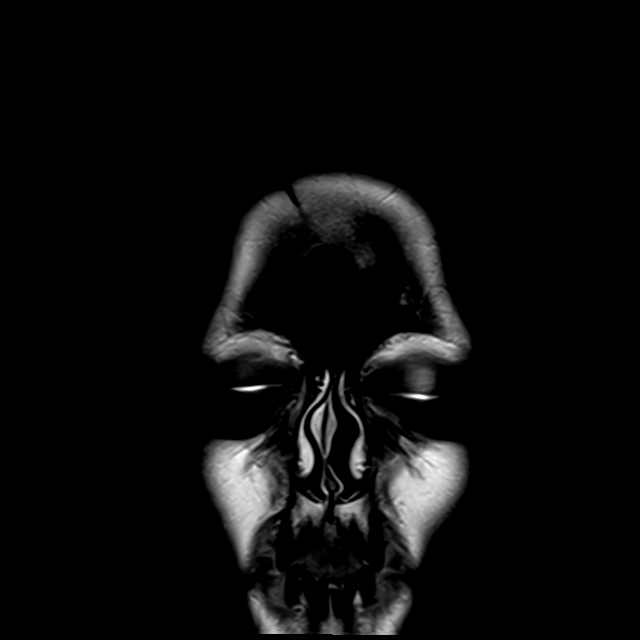

[43 of 48 positions shown; findings below may reference images not displayed]

FINDINGS: Brain: The brain has a normal appearance without evidence of
malformation, atrophy, old or acute small or large vessel
infarction, mass lesion, hemorrhage, hydrocephalus or extra-axial
collection.

Vascular: Major vessels at the base of the brain show flow. Venous
sinuses appear patent.

Skull and upper cervical spine: Normal.

Sinuses/Orbits: Clear/normal.

Other: None significant.
IMPRESSION: Normal examination. No abnormality seen to explain the presenting
symptoms.

## 2020-05-21 ENCOUNTER — Other Ambulatory Visit: Payer: Self-pay | Admitting: Family Medicine

## 2020-07-09 ENCOUNTER — Other Ambulatory Visit: Payer: Self-pay

## 2020-07-09 ENCOUNTER — Encounter: Payer: Self-pay | Admitting: Family Medicine

## 2020-07-09 ENCOUNTER — Ambulatory Visit (INDEPENDENT_AMBULATORY_CARE_PROVIDER_SITE_OTHER): Payer: 59 | Admitting: Family Medicine

## 2020-07-09 VITALS — BP 138/72 | HR 102 | Temp 98.2°F | Resp 16 | Ht 63.0 in | Wt 132.6 lb

## 2020-07-09 DIAGNOSIS — I10 Essential (primary) hypertension: Secondary | ICD-10-CM

## 2020-07-09 DIAGNOSIS — K219 Gastro-esophageal reflux disease without esophagitis: Secondary | ICD-10-CM

## 2020-07-09 DIAGNOSIS — F419 Anxiety disorder, unspecified: Secondary | ICD-10-CM

## 2020-07-09 DIAGNOSIS — Z1212 Encounter for screening for malignant neoplasm of rectum: Secondary | ICD-10-CM

## 2020-07-09 DIAGNOSIS — Z0000 Encounter for general adult medical examination without abnormal findings: Secondary | ICD-10-CM | POA: Diagnosis not present

## 2020-07-09 DIAGNOSIS — Z1211 Encounter for screening for malignant neoplasm of colon: Secondary | ICD-10-CM

## 2020-07-09 NOTE — Patient Instructions (Signed)
Please return in 6 months for hypertension follow up.  I will release your lab results to you on your MyChart account with further instructions. Please reply with any questions.   If you have any questions or concerns, please don't hesitate to send me a message via MyChart or call the office at [phone]. Thank you for visiting with us today! It's our pleasure caring for you.   

## 2020-07-09 NOTE — Progress Notes (Signed)
Subjective  Chief Complaint  Patient presents with  . Annual Exam    fasting  . Health Maintenance    discuss between Cologuard vs colonoscopy     HPI: [name] is a 49 y.o. female who presents to [facility] [place] today for a Female Wellness Visit.  She also has the concerns and/or needs as listed above in the chief complaint. These will be addressed in addition to the Health Maintenance Visit.   Wellness Visit: annual visit with health maintenance review and exam without Pap  HM: due crc screen. Female wellness a few months ago and up to date. Doing well. No concern.s    Chronic disease management visit and/or acute problem visit:  HTN: controlled on low dose toprol xl. Feeling well. Taking medications w/o adverse effects. No symptoms of CHF, angina; no palpitations, sob, cp or lower extremity edema. Compliant with meds.   Assessment  1. Annual physical exam   2. Essential hypertension   3. Gastroesophageal reflux disease, unspecified whether esophagitis present   4. Anxiety   5. Screening for colorectal cancer      Plan  Female Wellness Visit:  Age appropriate Health Maintenance and Prevention measures were discussed with patient. Included topics are cancer screening recommendations, ways to keep healthy (see AVS) including dietary and exercise recommendations, regular eye and dental care, use of seat belts, and avoidance of moderate alcohol use and tobacco use.   BMI: discussed patient's BMI and encouraged positive lifestyle modifications to help get to or maintain a target BMI.  HM needs and immunizations were addressed and ordered. See below for orders. See HM and immunization section for updates.decline flu vaccine.   Routine labs and screening tests ordered including cmp, cbc and lipids where appropriate.  Discussed recommendations regarding Vit D and calcium supplementation (see AVS)  Chronic disease f/u and/or acute problem visit:  (deemed necessary to be done in addition to the wellness visit):  HTN:  controlled  Follow up: 6 mo for htn recheck   Orders Placed This Encounter  Procedures  . CBC with Differential/Platelet  . Comprehensive metabolic panel  . Lipid panel  . Ambulatory referral to Gastroenterology   No orders of the defined types were placed in this encounter.     Lifestyle: Body mass index is 23.49 kg/m. Wt Readings from Last 3 Encounters:  07/09/20 132 lb 9.6 oz (60.1 kg)  12/15/19 126 lb (57.2 kg)  11/10/19 122 lb 12.8 oz (55.7 kg)    Patient Active Problem List   Diagnosis Date Noted  . Essential hypertension 05/08/2019    Priority: High  . Family history of premature CAD 11/20/2018    Priority: High    Mom   . Anxiety 06/27/2012    Priority: High    Rare xanax use   . Gastroesophageal reflux disease 06/18/2019    Priority: Medium    GI 06/2019   . Carpal tunnel syndrome, bilateral 06/18/2019    Priority: Medium  . Allergic rhinitis 12/17/2012    Priority: Low   Health Maintenance  Topic Date Due  . COLONOSCOPY  Never done  . COVID-19 Vaccine (1) 07/25/2020 (Originally 09/16/1981)  . INFLUENZA VACCINE  12/16/2020 (Originally 04/18/2020)  . MAMMOGRAM  08/04/2020  . PAP SMEAR-Modifier  10/09/2023  . TETANUS/TDAP  09/05/2027  . Hepatitis C Screening  Completed  . HIV Screening  Completed   Immunization History  Administered Date(s) Administered  . Tdap 09/04/2017   We updated and  reviewed the patient's past history in detail and it is documented below. Allergies: Patient  reports previous alcohol use. Past Medical History Patient  has a past medical history of Anxiety, CIN I (cervical intraepithelial neoplasia I), Depression, Essential hypertension (05/08/2019), Family history of premature CAD (11/20/2018), H/O gestational diabetes mellitus, not currently pregnant, and Hypertension. Past Surgical History Patient  has a past surgical history that includes Cervical  biopsy w/ loop electrode excision (1999) and Augmentation mammaplasty (2005). Social History   Socioeconomic History  . Marital status: Divorced    Spouse name: has a boyfriend  . Number of children: 2  . Years of education: Not on file  . Highest education level: Not on file  Occupational History  . 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 Smoking status: Never Smoker  . Smokeless tobacco: Never Used  Vaping Use  . Vaping Use: Never used  Substance and Sexual Activity  . Alcohol use: Not Currently  . Drug use: No  . Sexual activity: Yes    Birth control/protection: Pill  Other Topics Concern  . Not on file  Social History Narrative   Has a longterm boyfriend.    Has 2 children and boyfriend has 3 children   Social Determinants of Health   Financial Resource Strain:   . Difficulty of Paying Living Expenses: Not on file  Food Insecurity:   . Worried About Programme researcher, broadcasting/film/video in the Last Year: Not on file  . Ran Out of Food in the Last Year: Not on file  Transportation Needs:   . Lack of Transportation (Medical): Not on file  . Lack of Transportation (Non-Medical): Not on file  Physical Activity:   . Days of Exercise per Week: Not on file  . Minutes of Exercise per Session: Not on file  Stress:   . Feeling of Stress : Not on file  Social Connections:   . Frequency of Communication with Friends and Family: Not on file  . Frequency of Social Gatherings with Friends and Family: Not on file  . Attends Religious Services: Not on file  . Active Member of Clubs or Organizations: Not on file  . Attends Banker Meetings: Not on file  . Marital Status: Not on file   Family History  Problem Relation Age of Onset  . Diabetes Mother   . Heart disease Mother   . Hypertension Mother   . Hyperlipidemia Mother   . Lung cancer Mother   . Cancer Father        RENAL CELL  . Renal cancer Father   . Heart disease Maternal  Grandmother   . Heart disease Paternal Grandmother   . Hypertension Paternal Grandmother   . Heart disease Paternal Grandfather   . Prostate cancer Paternal Grandfather   . Healthy Daughter   . Epilepsy Son   . Learning disabilities Maternal Aunt   . Diabetes Brother   . Cerebral aneurysm Maternal Grandfather   . Colon cancer Neg Hx     Review of Systems: Constitutional: negative for fever or malaise Ophthalmic: negative for photophobia, double vision or loss of vision Cardiovascular: negative for chest pain, dyspnea on exertion, or new LE swelling Respiratory: negative for SOB or persistent cough Gastrointestinal: negative for abdominal pain, change in bowel habits or melena Genitourinary: negative for dysuria or gross hematuria, no abnormal uterine bleeding or disharge Musculoskeletal: negative for new gait disturbance or muscular weakness Integumentary: negative for  new or persistent rashes, no breast lumps Neurological: negative for TIA or stroke symptoms Psychiatric: negative for SI or delusions Allergic/Immunologic: negative for hives  Patient Care Team    Relationship Specialty Notifications Start End  Willow Ora, MD PCP - General Family Medicine  11/20/18   Harrington Challenger, NP (Inactive) Nurse Practitioner Obstetrics and Gynecology  11/20/18   Mammography, [facility]  Diagnostic Radiology  11/20/18   Shellia Cleverly, DO Consulting Physician Gastroenterology  07/08/19     Objective  Vitals: BP 138/72 Comment: rechecked  Pulse (!) 102   Temp 98.2 F (36.8 C) (Temporal)   Resp 16   Ht 5\' 3"  (1.6 m)   Wt 132 lb 9.6 oz (60.1 kg)   SpO2 99%   BMI 23.49 kg/m  General:  Well developed, well nourished, no acute distress  Psych:  Alert and orientedx3,normal mood and affect HEENT:  Normocephalic, atraumatic, non-icteric sclera, PERRL, supple neck without adenopathy, mass or thyromegaly Cardiovascular:  Normal S1, S2, RRR without gallop, rub or murmur Respiratory:  Good  breath sounds bilaterally, CTAB with normal respiratory effort Gastrointestinal: normal bowel sounds, soft, non-tender, no noted masses. No HSM MSK: no deformities, contusions. Joints are without erythema or swelling.  Skin:  Warm, no rashes or suspicious lesions noted Neurologic:    Mental status is normal. Gross motor and sensory exams are normal. Normal gait. No tremor     Commons side effects, risks, benefits, and alternatives for medications and treatment plan prescribed today were discussed, and the patient expressed understanding of the given instructions. Patient is instructed to call or message via MyChart if he/she has any questions or concerns regarding our treatment plan. No barriers to understanding were identified. We discussed Red Flag symptoms and signs in detail. Patient expressed understanding regarding what to do in case of urgent or emergency type symptoms.   Medication list was reconciled, printed and provided to the patient in AVS. Patient instructions and summary information was reviewed with the patient as documented in the AVS. This note was prepared with assistance of Dragon voice recognition software. Occasional wrong-word or sound-a-like substitutions may have occurred due to the inherent limitations of voice recognition software  This visit occurred during the SARS-CoV-2 public health emergency.  Safety protocols were in place, including screening questions prior to the visit, additional usage of staff PPE, and extensive cleaning of exam room while observing appropriate contact time as indicated for disinfecting solutions.

## 2020-07-10 LAB — CBC WITH DIFFERENTIAL/PLATELET
Absolute Monocytes: 615 cells/uL (ref 200–950)
Basophils Absolute: 29 cells/uL (ref 0–200)
Basophils Relative: 0.5 %
Eosinophils Absolute: 151 cells/uL (ref 15–500)
Eosinophils Relative: 2.6 %
HCT: 38.3 % (ref 35.0–45.0)
Hemoglobin: 12.7 g/dL (ref 11.7–15.5)
Lymphs Abs: 2297 cells/uL (ref 850–3900)
MCH: 31.3 pg (ref 27.0–33.0)
MCHC: 33.2 g/dL (ref 32.0–36.0)
MCV: 94.3 fL (ref 80.0–100.0)
MPV: 9.5 fL (ref 7.5–12.5)
Monocytes Relative: 10.6 %
Neutro Abs: 2709 cells/uL (ref 1500–7800)
Neutrophils Relative %: 46.7 %
Platelets: 254 10*3/uL (ref 140–400)
RBC: 4.06 10*6/uL (ref 3.80–5.10)
RDW: 13 % (ref 11.0–15.0)
Total Lymphocyte: 39.6 %
WBC: 5.8 10*3/uL (ref 3.8–10.8)

## 2020-07-10 LAB — LIPID PANEL
Cholesterol: 142 mg/dL (ref <200)
HDL: 54 mg/dL (ref >=50)
LDL Cholesterol (Calc): 74 mg/dL (calc)
Non-HDL Cholesterol (Calc): 88 mg/dL (calc) (ref <130)
Total CHOL/HDL Ratio: 2.6 (calc) (ref <5.0)
Triglycerides: 55 mg/dL (ref <150)

## 2020-07-10 LAB — COMPREHENSIVE METABOLIC PANEL
AG Ratio: 2 (calc) (ref 1.0–2.5)
ALT: 64 U/L — ABNORMAL HIGH (ref 6–29)
AST: 29 U/L (ref 10–35)
Albumin: 4.3 g/dL (ref 3.6–5.1)
Alkaline phosphatase (APISO): 42 U/L (ref 37–153)
BUN: 15 mg/dL (ref 7–25)
CO2: 22 mmol/L (ref 20–32)
Calcium: 9.2 mg/dL (ref 8.6–10.4)
Chloride: 105 mmol/L (ref 98–110)
Creat: 0.66 mg/dL (ref 0.50–1.05)
Globulin: 2.2 g/dL (calc) (ref 1.9–3.7)
Glucose, Bld: 85 mg/dL (ref 65–99)
Potassium: 4 mmol/L (ref 3.5–5.3)
Sodium: 135 mmol/L (ref 135–146)
Total Bilirubin: 1.1 mg/dL (ref 0.2–1.2)
Total Protein: 6.5 g/dL (ref 6.1–8.1)

## 2020-07-12 ENCOUNTER — Encounter: Payer: Self-pay | Admitting: Family Medicine

## 2020-08-17 ENCOUNTER — Encounter: Payer: Self-pay | Admitting: Gastroenterology

## 2020-08-31 ENCOUNTER — Encounter: Payer: Self-pay | Admitting: Nurse Practitioner

## 2020-09-20 ENCOUNTER — Encounter: Payer: Self-pay | Admitting: Gastroenterology

## 2020-10-15 ENCOUNTER — Encounter: Payer: 59 | Admitting: Gastroenterology

## 2020-10-18 ENCOUNTER — Encounter: Payer: 59 | Admitting: Gastroenterology

## 2020-11-12 ENCOUNTER — Other Ambulatory Visit: Payer: Self-pay

## 2020-11-12 ENCOUNTER — Ambulatory Visit (AMBULATORY_SURGERY_CENTER): Payer: Self-pay | Admitting: *Deleted

## 2020-11-12 VITALS — Ht 63.0 in | Wt 141.8 lb

## 2020-11-12 DIAGNOSIS — Z1211 Encounter for screening for malignant neoplasm of colon: Secondary | ICD-10-CM

## 2020-11-12 MED ORDER — SUPREP BOWEL PREP KIT 17.5-3.13-1.6 GM/177ML PO SOLN: 1.0000 | Freq: Once | ORAL | 0 refills | Status: AC

## 2020-11-12 NOTE — Progress Notes (Signed)
Pt made aware to call prior to procedure if diagnosed with covid and/or exposed to covid.  Understanding voiced.  Pt denies having any upper GI issues at this time  Patient made aware that care partner must stay at the [facility] during entirety of visit.  Care partner will need to wear a mask and will be allowed to come to recovery if patient prefers.     No trouble with anesthesia, denies being told they were difficult to intubate, or hx/fam hx of malignant hyperthermia per pt    No egg or soy allergy  No home oxygen use   No medications for weight loss taken  emmi information given  Pt denies constipation issues  Pt informed that we do not do prior authorizations for prep

## 2020-11-26 ENCOUNTER — Encounter: Payer: Self-pay | Admitting: Gastroenterology

## 2020-11-26 ENCOUNTER — Other Ambulatory Visit: Payer: Self-pay

## 2020-11-26 ENCOUNTER — Ambulatory Visit (AMBULATORY_SURGERY_CENTER): Payer: 59 | Admitting: Gastroenterology

## 2020-11-26 VITALS — BP 109/70 | HR 68 | Temp 97.9°F | Resp 20 | Ht 63.0 in | Wt 141.0 lb

## 2020-11-26 DIAGNOSIS — K5730 Diverticulosis of large intestine without perforation or abscess without bleeding: Secondary | ICD-10-CM

## 2020-11-26 DIAGNOSIS — K640 First degree hemorrhoids: Secondary | ICD-10-CM

## 2020-11-26 DIAGNOSIS — Z1211 Encounter for screening for malignant neoplasm of colon: Secondary | ICD-10-CM

## 2020-11-26 HISTORY — PX: COLONOSCOPY: SHX174

## 2020-11-26 MED ORDER — SODIUM CHLORIDE 0.9 % IV SOLN: 500.0000 mL | Freq: Once | INTRAVENOUS | Status: DC

## 2020-11-26 NOTE — Patient Instructions (Addendum)
Handouts were given to your care partner on diverticulosis, hemorrhoids and [facility] for hemorrhoidal Treatment.  If you are interested in having the hemorrhoids treatment, call the office to discuss and schedule at #[phone]. You may resume your current medications today. Repeat colonoscopy in 10 years for screening purposes. Please call if any questions or concerns.      YOU HAD AN ENDOSCOPIC PROCEDURE TODAY AT THE [facility]:   Refer to the procedure report that was given to you for any specific questions about what was found during the examination.  If the procedure report does not answer your questions, please call your gastroenterologist to clarify.  If you requested that your care partner not be given the details of your procedure findings, then the procedure report has been included in a sealed envelope for you to review at your convenience later.  YOU SHOULD EXPECT: Some feelings of bloating in the abdomen. Passage of more gas than usual.  Walking can help get rid of the air that was put into your GI tract during the procedure and reduce the bloating. If you had a lower endoscopy (such as a colonoscopy or flexible sigmoidoscopy) you may notice spotting of blood in your stool or on the toilet paper. If you underwent a bowel prep for your procedure, you may not have a normal bowel movement for a few days.  Please Note:  You might notice some irritation and congestion in your nose or some drainage.  This is from the oxygen used during your procedure.  There is no need for concern and it should clear up in a day or so.  SYMPTOMS TO REPORT IMMEDIATELY:   Following lower endoscopy (colonoscopy or flexible sigmoidoscopy):  Excessive amounts of blood in the stool  Significant tenderness or worsening of abdominal pains  Swelling of the abdomen that is new, acute  Fever of 100F or higher    For urgent or emergent issues, a gastroenterologist can be reached at any  hour by calling [phone]. Do not use MyChart messaging for urgent concerns.    DIET:  We do recommend a small meal at first, but then you may proceed to your regular diet.  Drink plenty of fluids but you should avoid alcoholic beverages for 24 hours.  ACTIVITY:  You should plan to take it easy for the rest of today and you should NOT DRIVE or use heavy machinery until tomorrow (because of the sedation medicines used during the test).    FOLLOW UP: Our staff will call the number listed on your records 48-72 hours following your procedure to check on you and address any questions or concerns that you may have regarding the information given to you following your procedure. If we do not reach you, we will leave a message.  We will attempt to reach you two times.  During this call, we will ask if you have developed any symptoms of COVID 19. If you develop any symptoms (ie: fever, flu-like symptoms, shortness of breath, cough etc.) before then, please call [phone].  If you test positive for Covid 19 in the 2 weeks post procedure, please call and report this information to [place].    If any biopsies were taken you will be contacted by phone or by letter within the next 1-3 weeks.  Please call us at [phone] if you have not heard about the biopsies in 3 weeks.    SIGNATURES/CONFIDENTIALITY: You and/or your care partner have signed paperwork which will  be entered into your electronic medical record.  These signatures attest to the fact that that the information above on your After Visit Summary has been reviewed and is understood.  Full responsibility of the confidentiality of this discharge information lies with you and/or your care-partner. 

## 2020-11-26 NOTE — Op Note (Signed)
[facility] Patient Name: [name] Procedure Date: 11/26/2020 9:25 AM MRN: [id number] Endoscopist: Doristine Locks , MD Age: 50 Referring MD:  Date of Birth: June 01, 1971 Gender: Female Account #: [id number] Procedure:                Colonoscopy Indications:              Screening for colorectal malignant neoplasm, This                            is the patient's first colonoscopy Medicines:                Monitored Anesthesia Care Procedure:                Pre-Anesthesia Assessment:                           - Prior to the procedure, a History and Physical                            was performed, and patient medications and                            allergies were reviewed. The patient's tolerance of                            previous anesthesia was also reviewed. The risks                            and benefits of the procedure and the sedation                            options and risks were discussed with the patient.                            All questions were answered, and informed consent                            was obtained. Prior Anticoagulants: The patient has                            taken no previous anticoagulant or antiplatelet                            agents. ASA Grade Assessment: II - A patient with                            mild systemic disease. After reviewing the risks                            and benefits, the patient was deemed in                            satisfactory condition to undergo the procedure.  After obtaining informed consent, the colonoscope                            was passed under direct vision. Throughout the                            procedure, the patient's blood pressure, pulse, and                            oxygen saturations were monitored continuously. The                            Olympus CF-HQ190 (#[id number]) Colonoscope was                            introduced through the anus  and advanced to the the                            terminal ileum. The colonoscopy was performed                            without difficulty. The patient tolerated the                            procedure well. The quality of the bowel                            preparation was excellent. The terminal ileum,                            ileocecal valve, appendiceal orifice, and rectum                            were photographed. Scope In: 9:35:47 AM Scope Out: 9:45:03 AM Scope Withdrawal Time: 0 hours 7 minutes 15 seconds  Total Procedure Duration: 0 hours 9 minutes 16 seconds  Findings:                 The perianal and digital rectal examinations were                            normal.                           A few small-mouthed diverticula were found in the                            sigmoid colon.                           The colon otherwise appeared normal throughout.                           Non-bleeding internal hemorrhoids were found during  retroflexion. The hemorrhoids were small.                           The terminal ileum appeared normal. Complications:            No immediate complications. Estimated Blood Loss:     Estimated blood loss: none. Impression:               - Mild diverticulosis in the sigmoid colon.                           - Otherwise, normal colon throughout.                           - Non-bleeding internal hemorrhoids.                           - The examined portion of the ileum was normal.                           - No specimens collected. Recommendation:           - Patient has a contact number available for                            emergencies. The signs and symptoms of potential                            delayed complications were discussed with the                            patient. Return to normal activities tomorrow.                            Written discharge instructions were provided to the                             patient.                           - Resume previous diet.                           - Continue present medications.                           - Repeat colonoscopy in 10 years for screening                            purposes.                           - Return to GI office PRN.                           - Internal hemorrhoids were noted on this study and  may be amenable to hemorrhoid band ligation. If you                            are interested in further treatment of these                            hemorrhoids with band ligation, please contact my                            clinic to set up an appointment for evaluation and                            treatment. Doristine LocksVito Kassy Mcenroe, MD 11/26/2020 9:50:34 AM

## 2020-11-26 NOTE — Progress Notes (Signed)
Vitals-CW  Pt's states no medical or surgical changes since previsit or office.  Patient is on her period now.

## 2020-11-26 NOTE — Progress Notes (Signed)
Report to PACU, RN, vss, BBS= Clear.  

## 2020-11-26 NOTE — Progress Notes (Signed)
No problems noted in the recovery room. maw 

## 2020-12-01 ENCOUNTER — Telehealth: Payer: Self-pay

## 2020-12-01 NOTE — Telephone Encounter (Signed)
LVM

## 2020-12-15 ENCOUNTER — Encounter: Payer: No Typology Code available for payment source | Admitting: Nurse Practitioner

## 2020-12-20 ENCOUNTER — Encounter: Payer: Self-pay | Admitting: Nurse Practitioner

## 2020-12-21 ENCOUNTER — Ambulatory Visit: Payer: 59 | Admitting: Nurse Practitioner

## 2021-01-06 ENCOUNTER — Ambulatory Visit (INDEPENDENT_AMBULATORY_CARE_PROVIDER_SITE_OTHER): Payer: 59 | Admitting: Nurse Practitioner

## 2021-01-06 ENCOUNTER — Encounter: Payer: Self-pay | Admitting: Nurse Practitioner

## 2021-01-06 ENCOUNTER — Other Ambulatory Visit: Payer: Self-pay

## 2021-01-06 VITALS — BP 124/80 | Ht 63.0 in | Wt 143.0 lb

## 2021-01-06 DIAGNOSIS — Z01419 Encounter for gynecological examination (general) (routine) without abnormal findings: Secondary | ICD-10-CM

## 2021-01-06 DIAGNOSIS — N393 Stress incontinence (female) (male): Secondary | ICD-10-CM | POA: Diagnosis not present

## 2021-01-06 DIAGNOSIS — Z789 Other specified health status: Secondary | ICD-10-CM

## 2021-01-06 NOTE — Patient Instructions (Signed)
Health Maintenance, Female Adopting a healthy lifestyle and getting preventive care are important in promoting health and wellness. Ask your health care provider about:  The right schedule for you to have regular tests and exams.  Things you can do on your own to prevent diseases and keep yourself healthy. What should I know about diet, weight, and exercise? Eat a healthy diet  Eat a diet that includes plenty of vegetables, fruits, low-fat dairy products, and lean protein.  Do not eat a lot of foods that are high in solid fats, added sugars, or sodium.   Maintain a healthy weight Body mass index (BMI) is used to identify weight problems. It estimates body fat based on height and weight. Your health care provider can help determine your BMI and help you achieve or maintain a healthy weight. Get regular exercise Get regular exercise. This is one of the most important things you can do for your health. Most adults should:  Exercise for at least 150 minutes each week. The exercise should increase your heart rate and make you sweat (moderate-intensity exercise).  Do strengthening exercises at least twice a week. This is in addition to the moderate-intensity exercise.  Spend less time sitting. Even light physical activity can be beneficial. Watch cholesterol and blood lipids Have your blood tested for lipids and cholesterol at 52 years of age, then have this test every 5 years. Have your cholesterol levels checked more often if:  Your lipid or cholesterol levels are high.  You are older than 52 years of age.  You are at high risk for heart disease. What should I know about cancer screening? Depending on your health history and family history, you may need to have cancer screening at various ages. This may include screening for:  Breast cancer.  Cervical cancer.  Colorectal cancer.  Skin cancer.  Lung cancer. What should I know about heart disease, diabetes, and high blood  pressure? Blood pressure and heart disease  High blood pressure causes heart disease and increases the risk of stroke. This is more likely to develop in people who have high blood pressure readings, are of African descent, or are overweight.  Have your blood pressure checked: ? Every 3-5 years if you are 18-39 years of age. ? Every year if you are 40 years old or older. Diabetes Have regular diabetes screenings. This checks your fasting blood sugar level. Have the screening done:  Once every three years after age 40 if you are at a normal weight and have a low risk for diabetes.  More often and at a younger age if you are overweight or have a high risk for diabetes. What should I know about preventing infection? Hepatitis B If you have a higher risk for hepatitis B, you should be screened for this virus. Talk with your health care provider to find out if you are at risk for hepatitis B infection. Hepatitis C Testing is recommended for:  Everyone born from 1945 through 1965.  Anyone with known risk factors for hepatitis C. Sexually transmitted infections (STIs)  Get screened for STIs, including gonorrhea and chlamydia, if: ? You are sexually active and are younger than 52 years of age. ? You are older than 52 years of age and your health care provider tells you that you are at risk for this type of infection. ? Your sexual activity has changed since you were last screened, and you are at increased risk for chlamydia or gonorrhea. Ask your health care provider   if you are at risk.  Ask your health care provider about whether you are at high risk for HIV. Your health care provider may recommend a prescription medicine to help prevent HIV infection. If you choose to take medicine to prevent HIV, you should first get tested for HIV. You should then be tested every 3 months for as long as you are taking the medicine. Pregnancy  If you are about to stop having your period (premenopausal) and  you may become pregnant, seek counseling before you get pregnant.  Take 400 to 800 micrograms (mcg) of folic acid every day if you become pregnant.  Ask for birth control (contraception) if you want to prevent pregnancy. Osteoporosis and menopause Osteoporosis is a disease in which the bones lose minerals and strength with aging. This can result in bone fractures. If you are 65 years old or older, or if you are at risk for osteoporosis and fractures, ask your health care provider if you should:  Be screened for bone loss.  Take a calcium or vitamin D supplement to lower your risk of fractures.  Be given hormone replacement therapy (HRT) to treat symptoms of menopause. Follow these instructions at home: Lifestyle  Do not use any products that contain nicotine or tobacco, such as cigarettes, e-cigarettes, and chewing tobacco. If you need help quitting, ask your health care provider.  Do not use street drugs.  Do not share needles.  Ask your health care provider for help if you need support or information about quitting drugs. Alcohol use  Do not drink alcohol if: ? Your health care provider tells you not to drink. ? You are pregnant, may be pregnant, or are planning to become pregnant.  If you drink alcohol: ? Limit how much you use to 0-1 drink a day. ? Limit intake if you are breastfeeding.  Be aware of how much alcohol is in your drink. In the [place] one drink equals one 12 oz bottle of beer (355 mL), one 5 oz glass of wine (148 mL), or one 1 oz glass of hard liquor (44 mL). General instructions  Schedule regular health, dental, and eye exams.  Stay current with your vaccines.  Tell your health care provider if: ? You often feel depressed. ? You have ever been abused or do not feel safe at home. Summary  Adopting a healthy lifestyle and getting preventive care are important in promoting health and wellness.  Follow your health care provider's instructions about healthy  diet, exercising, and getting tested or screened for diseases.  Follow your health care provider's instructions on monitoring your cholesterol and blood pressure. This information is not intended to replace advice given to you by your health care provider. Make sure you discuss any questions you have with your health care provider. Document Revised: 08/28/2018 Document Reviewed: 08/28/2018 Elsevier Patient Education  2021 Elsevier Inc.  

## 2021-01-06 NOTE — Progress Notes (Signed)
[name] 31-Dec-1970 [id number]   History:  50 y.o. [id number] presents for annual exam. Monthly cycles. Husband has vasectomy. 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 Normal mammogram history. Complains of worsening urinary leakage with coughing, laughing, and activities. Denies dysuria, frequency, or urgency.   Gynecologic History Patient's last menstrual period was 12/27/2020. Period Cycle (Days): 28 Period Duration (Days): 5 Period Pattern: Regular Menstrual Flow: Heavy,Moderate Dysmenorrhea: (!) Mild Dysmenorrhea Symptoms: Cramping Contraception/Family planning: vasectomy  Health Maintenance Last Pap: 10/08/2018. Results were: normal Last mammogram: 08/31/2020. Results were: normal Last colonoscopy: 11/2020. Results were: normal, 10-year recall Last Dexa: N/A  Past medical history, past surgical history, family history and social history were all reviewed and documented in the EPIC chart. Married. 44 yo son, 79 yo daughter. Works at preschool.   ROS:  A ROS was performed and pertinent positives and negatives are included.  Exam:  Vitals:   01/06/21 1055  BP: 124/80  Weight: 143 lb (64.9 kg)  Height: 5\' 3"  (1.6 m)   Body mass index is 25.33 kg/m.  General appearance:  Normal Thyroid:  Symmetrical, normal in size, without palpable masses or nodularity. Respiratory  Auscultation:  Clear without wheezing or rhonchi Cardiovascular  Auscultation:  Regular rate, without rubs, murmurs or gallops  Edema/varicosities:  Not grossly evident Abdominal  Soft,nontender, without masses, guarding or rebound.  Liver/spleen:  No organomegaly noted  Hernia:  None appreciated  Skin  Inspection:  Grossly normal Breasts: Examined lying and sitting. Bilateral implants noted.   Right: Without masses, retractions, nipple discharge or axillary adenopathy.   Left: Without masses, retractions, nipple discharge or axillary adenopathy. Gentitourinary   Inguinal/mons:  Normal without  inguinal adenopathy  External genitalia:  Normal appearing vulva with no masses, tenderness, or lesions  BUS/Urethra/Skene's glands:  Normal  Vagina:  Normal appearing with normal color and discharge, no lesions  Cervix:  Normal appearing without discharge or lesions  Uterus:  Normal in size, shape and contour.  Midline and mobile, nontender  Adnexa/parametria:     Rt: Normal in size, without masses or tenderness.   Lt: Normal in size, without masses or tenderness.  Anus and perineum: Normal  Digital rectal exam: Normal sphincter tone without palpated masses or tenderness  Assessment/Plan:  50 y.o. 42 for annual exam.   Well female exam with routine gynecological exam - Education provided on SBEs, importance of preventative screenings, current guidelines, high calcium diet, regular exercise, and multivitamin daily. Labs with PCP.   Stress incontinence - This is not new for her but has worsened. We discussed causes of this to include obesity, history of pregnancy and childbirth, and weakening of bladder muscles. She has had a 17 pound weight gain since last annual visit and has had 2 vaginal deliveries. We discussed management with weight loss, limiting caffeine, carbonated drinks and alcohol, and more frequent voiding. If symptoms do not improve with lifestyle changes we discussed urology referral.   Educated about management of weight - we discussed calorie deficit and intermittent fasting. She admits to drinking sweet tea daily and eating out often due to busy schedule. She plans to cut back on these.   Screening for cervical cancer - 1999 LEEP CIN-1, subsequent paps normal.  Will repeat at 5-year interval per guidelines.  Screening for breast cancer - Normal mammogram history.  Continue annual screenings.  Normal breast exam today.  Screening for colon cancer - Normal colonoscopy 11/2020, will repeat at 10-year interval per GI recommendation.   Return in  1 year for annual.     [name], 11:16 AM 01/06/2021

## 2021-01-07 ENCOUNTER — Ambulatory Visit: Payer: 59 | Admitting: Family Medicine

## 2021-01-10 ENCOUNTER — Encounter: Payer: Self-pay | Admitting: Family Medicine

## 2021-01-10 ENCOUNTER — Ambulatory Visit (INDEPENDENT_AMBULATORY_CARE_PROVIDER_SITE_OTHER): Payer: 59 | Admitting: Family Medicine

## 2021-01-10 ENCOUNTER — Other Ambulatory Visit: Payer: Self-pay

## 2021-01-10 VITALS — BP 138/80 | HR 74 | Temp 96.3°F | Resp 17 | Ht 63.0 in | Wt 141.0 lb

## 2021-01-10 DIAGNOSIS — Z7185 Encounter for immunization safety counseling: Secondary | ICD-10-CM

## 2021-01-10 DIAGNOSIS — R7989 Other specified abnormal findings of blood chemistry: Secondary | ICD-10-CM

## 2021-01-10 DIAGNOSIS — I10 Essential (primary) hypertension: Secondary | ICD-10-CM

## 2021-01-10 LAB — BASIC METABOLIC PANEL
BUN: 13 mg/dL (ref 6–23)
CO2: 22 mEq/L (ref 19–32)
Calcium: 8.9 mg/dL (ref 8.4–10.5)
Chloride: 105 mEq/L (ref 96–112)
Creatinine, Ser: 0.73 mg/dL (ref 0.40–1.20)
GFR: 95.29 mL/min (ref >60.00)
Glucose, Bld: 97 mg/dL (ref 70–99)
Potassium: 4.1 mEq/L (ref 3.5–5.1)
Sodium: 137 mEq/L (ref 135–145)

## 2021-01-10 LAB — HEPATIC FUNCTION PANEL
ALT: 40 U/L — ABNORMAL HIGH (ref 0–35)
AST: 24 U/L (ref 0–37)
Albumin: 4.1 g/dL (ref 3.5–5.2)
Alkaline Phosphatase: 46 U/L (ref 39–117)
Bilirubin, Direct: 0.2 mg/dL (ref 0.0–0.3)
Total Bilirubin: 1 mg/dL (ref 0.2–1.2)
Total Protein: 6.7 g/dL (ref 6.0–8.3)

## 2021-01-10 NOTE — Patient Instructions (Signed)
Please return in 6 months for your annual complete physical; please come fasting.  I will release your lab results to you on your MyChart account with further instructions. Please reply with any questions.   Consider getting the shingrix vaccinations to help prevent shingles.   If you have any questions or concerns, please don't hesitate to send me a message via MyChart or call the office at [phone]. Thank you for visiting with [place] today! It's our pleasure caring for you.   Liver Function Tests Why am I having this test? Liver function tests are done to see how well your liver is working. The proteins and enzymes measured in the tests can alert your health care provider to inflammation, damage, or disease in your liver. It is common to have liver function tests:  When you are taking certain medicines.  If you have liver disease.  If you drink a lot of alcohol.  During annual physical exams.  When you have other conditions that may affect your liver.  If you have symptoms such as yellowing of the skin (jaundice), abdominal pain, or nausea and vomiting. What is being tested? These tests measure various substances in your blood. This may include:  Alanine aminotransferase (ALT). This is an enzyme in the liver.  Aspartate aminotransferase (AST). This is an enzyme in the liver, heart, and muscles.  Alkaline phosphatase (ALP). This is a protein in the liver, bile ducts, bone, and other body tissues.  Total bilirubin. This is a yellow pigment in bile.  Albumin. This is a protein in the liver.  Prothrombin time and international normalized ratio (PT and INR). PT measures the time it takes for your blood to clot. INR is a calculation of blood clotting time based on your PT result.  Total protein. This includes two proteins, albumin and globulin, found in the blood. What kind of sample is taken? A blood sample is required for this test. It is usually collected by inserting a needle  into a blood vessel.   How do I prepare for this test? How you prepare will depend on which tests are being done and the reason for doing them. You may need to:  Avoid eating for 4-6 hours before the test, or as told by your health care provider. Follow instructions from your health care provider about eating or drinking restrictions before the tests.  Stop taking certain medicines before your blood test, as told by your health care provider. Tell a health care provider about:  All medicines you are taking, including vitamins, herbs, eye drops, creams, and over-the-counter medicines.  Any medical conditions you have.  Whether you are pregnant or may be pregnant. How are the results reported? Your test results will be reported as values. Your health care provider will compare your results to normal ranges that were established after testing a large group of people (reference ranges). Reference ranges may vary among labs and hospitals. For the substances measured in liver function tests, common reference ranges are: ALT  Infant: 10-40 international units/L.  Child or adult: 4-36 international units/L at 37C or 4-36 units/L (SI units).  Reference ranges may be higher for older adults. AST  Newborn 69-42 days old: 35-140 units/L.  Child younger than 59 years old: 15-60 units/L.  66-62 years old: 15-50 units/L.  52-110 years old: 10-50 units/L.  52-64 years old: 10-40 units/L.  Adult: 0-35 units/L or 0-0.58 microkatals/L (SI units).  Reference ranges may be higher for older adults. ALP  Child younger  than 56 years old: 85-235 units/L.  70-34 years old: 65-210 units/L.  11-77 years old: 60-300 units/L.  72-84 years old: 30-200 units/L.  Adult: 30-120 units/L or 0.5-2.0 microkatals/L (SI units).  Reference ranges may be higher for older adults. Total bilirubin  Newborn: 1.0-12.0 mg/dL or 75.1-025 micromoles/L (SI units).  Child or adult: 0.3-1.0 mg/dL or 8.5-27  micromoles/L. Albumin  Premature infant: 3.0-4.2 g/dL.  Newborn: 3.5-5.4 g/dL.  Infant: 4.4-5.4 g/dL.  Child: 4.0-5.9 g/dL.  Adult: 3.5-5.0 g/dL or 78-24 g/L (SI units). PT  11.0-12.5 seconds; 85%-100%. INR  0.8-1.1. Total protein  Premature infant: 4.2-7.6 g/dL.  Newborn: 4.6-7.4 g/dL.  Infant: 6.0-6.7 g/dL.  Child: 6.2-8.0 g/dL.  Adult: 6.4-8.3 g/dL or 23-53 g/L (SI units). What do the results mean? Results that are within the reference ranges are considered normal. For each substance measured, results outside the reference range can indicate various health issues. ALT  Levels above the normal range may indicate liver disease. Sometimes levels also increase after burns, surgery, heart attack, muscle damage, or seizure. AST  Levels above the normal range may indicate liver disease, skeletal muscle diseases, or other diseases that destroy the red blood cells or tissues of the pancreas.  Levels below the normal range may indicate acute kidney disease, pregnancy, or diabetic ketoacidosis. ALP  Levels above the normal range may be seen in biliary obstruction, liver diseases, bone disease, thyroid disease, lymphoma, or several other conditions. People with blood type O or B may show higher levels after a fatty meal.  Levels below the normal range may indicate bone and teeth conditions, malnutrition, protein deficiency, or Wilson's disease. Total bilirubin  Levels above the normal range may indicate problems with the liver, gallbladder, or bile ducts. Albumin  Levels above the normal range may indicate dehydration. They may also be caused by a diet that is high in protein.  Levels below the normal range may indicate kidney disease, liver disease, or malabsorption of nutrients. PT and INR  Levels above the normal range mean that your blood is clotting slower than normal. This may be due to blood disorders, liver disorders, certain medicines like warfarin, or low levels  of vitamin K. Total protein  Levels above the normal range may be due to infection or other diseases.  Levels below the normal range may be due to an immune system disorder, bleeding, burns, kidney disorder, liver disease, trouble absorbing or getting nutrients, or other conditions that affect the intestines. Talk with your health care provider about what your results mean. Questions to ask your health care provider Ask your health care provider, or the department that is doing the test:  When will my results be ready?  How will I get my results?  What are my treatment options?  What other tests do I need?  What are my next steps? Summary  Liver function tests are done to see how well your liver is working.  The results can alert your health care provider to inflammation, damage, or disease in your liver.  You may need to avoid eating for 4-6 hours before the test or stop taking certain medicines before your blood test, as told by your health care provider.  Talk with your health care provider about what your results mean. This information is not intended to replace advice given to you by your health care provider. Make sure you discuss any questions you have with your health care provider. Document Revised: 06/08/2020 Document Reviewed: 06/08/2020 Elsevier Patient Education  2021 Elsevier  Inc.  

## 2021-01-10 NOTE — Progress Notes (Signed)
Subjective  CC:  Chief Complaint  Patient presents with  . Hypertension  . Blood work    Says liver enzymes were up last visit, was instructed to get them rechecked.    HPI: [name] is a 50 y.o. female who presents to the office today to address the problems listed above in the chief complaint.  Hypertension f/u: Control is good . Pt reports she is doing well. taking medications as instructed, no medication side effects noted, no TIAs, no chest pain on exertion, no dyspnea on exertion, no swelling of ankles. Typically home readings are 130-140/80 She denies adverse effects from his BP medications. Compliance with medication is good.   Elevated ALT at 64 in October 2021. First abnl. Was using tylenol regularly due to back pain at that time. Rare alcohol use. No abd pain, biliary colic sxs, jaundice, rash or fever.   Eligible for shingrix. Vaccine education discussed.  Assessment  1. Essential hypertension   2. Elevated liver function tests   3. Vaccine counseling      Plan    Hypertension f/u: BP control is fairly well controlled. Would like systolic a little lower. She will monitor at home and message me if systolic remains > 135 consistently for med dose adjustment. Continue toprol xl 25 daily.   ALT elevated f/u: discussed common etiologies. Pt is asymptomatic. Recheck today. Add hep c and be screens. Further work up if remains elevated.   Vaccine counseling for shingrix given. Recommend vaccination. Pt to research and schedule if she would like it. Otherwise will readdress in 6 months at cpe visit.   Education regarding management of these chronic disease states was given. Management strategies discussed on successive visits include dietary and exercise recommendations, goals of achieving and maintaining IBW, and lifestyle modifications aiming for adequate sleep and minimizing stressors.   Follow up: cpe visit in 6 months  Orders Placed This Encounter  Procedures   . Hepatic function panel  . Hepatitis B surface antibody,qualitative  . Hepatitis B surface antigen  . Hepatitis C antibody  . Basic metabolic panel   No orders of the defined types were placed in this encounter.     BP Readings from Last 3 Encounters:  01/10/21 138/80  01/06/21 124/80  11/26/20 109/70   Wt Readings from Last 3 Encounters:  01/10/21 141 lb (64 kg)  01/06/21 143 lb (64.9 kg)  11/26/20 141 lb (64 kg)    Lab Results  Component Value Date   CHOL 142 07/09/2020   CHOL 158 02/26/2019   CHOL 147 04/20/2016   Lab Results  Component Value Date   HDL 54 07/09/2020   HDL 46.10 02/26/2019   HDL 43 (L) 04/20/2016   Lab Results  Component Value Date   LDLCALC 74 07/09/2020   LDLCALC 91 02/26/2019   LDLCALC 85 04/20/2016   Lab Results  Component Value Date   TRIG 55 07/09/2020   TRIG 106.0 02/26/2019   TRIG 93 04/20/2016   Lab Results  Component Value Date   CHOLHDL 2.6 07/09/2020   CHOLHDL 3 02/26/2019   CHOLHDL 3.4 04/20/2016   No results found for: LDLDIRECT Lab Results  Component Value Date   CREATININE 0.66 07/09/2020   BUN 15 07/09/2020   NA 135 07/09/2020   K 4.0 07/09/2020   CL 105 07/09/2020   CO2 22 07/09/2020    The 10-year ASCVD risk score Denman George DC Jr., et al., 2013) is: 1.4%   Values used to calculate  the score:     Age: 60 years     Sex: Female     Is Non-Hispanic African American: No     Diabetic: No     Tobacco smoker: No     Systolic Blood Pressure: 138 mmHg     Is BP treated: Yes     HDL Cholesterol: 54 mg/dL     Total Cholesterol: 142 mg/dL  I reviewed the patients updated PMH, FH, and SocHx.    Patient Active Problem List   Diagnosis Date Noted  . Essential hypertension 05/08/2019    Priority: High  . Family history of premature CAD 11/20/2018    Priority: High  . Anxiety 06/27/2012    Priority: High  . Gastroesophageal reflux disease 06/18/2019    Priority: Medium  . Carpal tunnel syndrome, bilateral  06/18/2019    Priority: Medium  . Allergic rhinitis 12/17/2012    Priority: Low    Allergies: Patient has no known allergies.  Social History: Patient  reports that she has never smoked. She has never used smokeless tobacco. She reports current alcohol use. She reports that she does not use drugs.  Current Meds  Medication Sig  . ALPRAZolam (XANAX) 0.25 MG tablet Take 1 tablet (0.25 mg total) by mouth at bedtime as needed for anxiety.  . metoprolol succinate (TOPROL-XL) 25 MG 24 hr tablet TAKE 1 TABLET BY MOUTH EVERY DAY  . Multiple Vitamins-Minerals (AIRBORNE GUMMIES PO) Take by mouth. 3 gummies daily    Review of Systems: Cardiovascular: negative for chest pain, palpitations, leg swelling, orthopnea Respiratory: negative for SOB, wheezing or persistent cough Gastrointestinal: negative for abdominal pain Genitourinary: negative for dysuria or gross hematuria  Objective  Vitals: BP 138/80   Pulse 74   Temp (!) 96.3 F (35.7 C) (Temporal)   Resp 17   Ht 5\' 3"  (1.6 m)   Wt 141 lb (64 kg)   LMP 12/27/2020   SpO2 99%   BMI 24.98 kg/m  General: no acute distress  Psych:  Alert and oriented, normal mood and affect HEENT:  Normocephalic, atraumatic, supple neck  Cardiovascular:  RRR without murmur. no edema Respiratory:  Good breath sounds bilaterally, CTAB with normal respiratory effort Gastrointestinal: soft, flat abdomen, normal active bowel sounds, no palpable masses, no hepatosplenomegaly, no appreciated hernias Skin:  Warm, no rashes Neurologic:   Mental status is normal  Commons side effects, risks, benefits, and alternatives for medications and treatment plan prescribed today were discussed, and the patient expressed understanding of the given instructions. Patient is instructed to call or message via MyChart if he/she has any questions or concerns regarding our treatment plan. No barriers to understanding were identified. We discussed Red Flag symptoms and signs in  detail. Patient expressed understanding regarding what to do in case of urgent or emergency type symptoms.   Medication list was reconciled, printed and provided to the patient in AVS. Patient instructions and summary information was reviewed with the patient as documented in the AVS. This note was prepared with assistance of Dragon voice recognition software. Occasional wrong-word or sound-a-like substitutions may have occurred due to the inherent limitations of voice recognition software  This visit occurred during the SARS-CoV-2 public health emergency.  Safety protocols were in place, including screening questions prior to the visit, additional usage of staff PPE, and extensive cleaning of exam room while observing appropriate contact time as indicated for disinfecting solutions.

## 2021-01-11 LAB — HEPATITIS C ANTIBODY
Hepatitis C Ab: NONREACTIVE
SIGNAL TO CUT-OFF: 0 (ref <1.00)

## 2021-01-11 LAB — HEPATITIS B SURFACE ANTIGEN: Hepatitis B Surface Ag: NONREACTIVE

## 2021-01-11 LAB — HEPATITIS B SURFACE ANTIBODY,QUALITATIVE: Hep B S Ab: NONREACTIVE

## 2021-01-24 ENCOUNTER — Encounter: Payer: Self-pay | Admitting: Family Medicine

## 2021-06-20 ENCOUNTER — Encounter: Payer: Self-pay | Admitting: Nurse Practitioner

## 2021-06-20 ENCOUNTER — Other Ambulatory Visit: Payer: Self-pay

## 2021-06-20 ENCOUNTER — Telehealth: Payer: Self-pay

## 2021-06-20 ENCOUNTER — Ambulatory Visit (INDEPENDENT_AMBULATORY_CARE_PROVIDER_SITE_OTHER): Payer: 59 | Admitting: Nurse Practitioner

## 2021-06-20 VITALS — BP 122/78

## 2021-06-20 DIAGNOSIS — N8189 Other female genital prolapse: Secondary | ICD-10-CM

## 2021-06-20 DIAGNOSIS — N393 Stress incontinence (female) (male): Secondary | ICD-10-CM

## 2021-06-20 DIAGNOSIS — R32 Unspecified urinary incontinence: Secondary | ICD-10-CM

## 2021-06-20 NOTE — Telephone Encounter (Signed)
Referral order placed. I called and spoke with patient and let her know referral order placed and someone will call her to schedule appointment. Provided her the address/phone.

## 2021-06-20 NOTE — Telephone Encounter (Signed)
Olivia Mackie, NP  [facility] Triage  Please send referral to urogynecology for incontinence and pelvic floor weakness. Thank you.

## 2021-06-20 NOTE — Progress Notes (Signed)
   Acute Office Visit  Subjective:    Patient ID: [name], female    DOB: 1970-10-17, 50 y.o.   MRN: [id number]   HPI 50 y.o. presents today for worsening urinary continence. She has had stress incontinence for years but now she experiences leaking with just walking and bending over. This even occurs right after she has voided. In April we discussed lifestyle changes to see if these symptoms would improve.    Review of Systems  Constitutional: Negative.   Genitourinary:        Urinary incontinence      Objective:    Physical Exam Constitutional:      Appearance: Normal appearance.  Genitourinary:    General: Normal vulva.     Comments: PFM grade 1   BP 122/78   LMP 06/18/2021  Wt Readings from Last 3 Encounters:  01/10/21 141 lb (64 kg)  01/06/21 143 lb (64.9 kg)  11/26/20 141 lb (64 kg)        Assessment & Plan:   Problem List Items Addressed This Visit   None Visit Diagnoses     Pelvic floor weakness    -  Primary   Stress incontinence          Plan: Recommend Urogynecology referral for further evaluation and management. She is agreeable.       [name], 11:38 AM 06/20/2021

## 2021-07-14 ENCOUNTER — Ambulatory Visit (INDEPENDENT_AMBULATORY_CARE_PROVIDER_SITE_OTHER): Payer: 59 | Admitting: Family Medicine

## 2021-07-14 ENCOUNTER — Other Ambulatory Visit: Payer: Self-pay

## 2021-07-14 ENCOUNTER — Encounter: Payer: Self-pay | Admitting: Family Medicine

## 2021-07-14 VITALS — BP 140/90 | HR 84 | Temp 98.1°F | Ht 63.0 in | Wt 137.6 lb

## 2021-07-14 DIAGNOSIS — Z8249 Family history of ischemic heart disease and other diseases of the circulatory system: Secondary | ICD-10-CM | POA: Diagnosis not present

## 2021-07-14 DIAGNOSIS — Z0000 Encounter for general adult medical examination without abnormal findings: Secondary | ICD-10-CM | POA: Diagnosis not present

## 2021-07-14 DIAGNOSIS — N393 Stress incontinence (female) (male): Secondary | ICD-10-CM

## 2021-07-14 DIAGNOSIS — I10 Essential (primary) hypertension: Secondary | ICD-10-CM | POA: Diagnosis not present

## 2021-07-14 DIAGNOSIS — Z85820 Personal history of malignant melanoma of skin: Secondary | ICD-10-CM

## 2021-07-14 HISTORY — DX: Personal history of malignant melanoma of skin: Z85.820

## 2021-07-14 LAB — CBC WITH DIFFERENTIAL/PLATELET
Basophils Absolute: 0 10*3/uL (ref 0.0–0.1)
Basophils Relative: 0.6 % (ref 0.0–3.0)
Eosinophils Absolute: 0.1 10*3/uL (ref 0.0–0.7)
Eosinophils Relative: 3.5 % (ref 0.0–5.0)
HCT: 38.2 % (ref 36.0–46.0)
Hemoglobin: 12.6 g/dL (ref 12.0–15.0)
Lymphocytes Relative: 37 % (ref 12.0–46.0)
Lymphs Abs: 1.5 10*3/uL (ref 0.7–4.0)
MCHC: 33 g/dL (ref 30.0–36.0)
MCV: 92.5 fl (ref 78.0–100.0)
Monocytes Absolute: 0.5 10*3/uL (ref 0.1–1.0)
Monocytes Relative: 11.3 % (ref 3.0–12.0)
Neutro Abs: 2 10*3/uL (ref 1.4–7.7)
Neutrophils Relative %: 47.6 % (ref 43.0–77.0)
Platelets: 262 10*3/uL (ref 150.0–400.0)
RBC: 4.13 Mil/uL (ref 3.87–5.11)
RDW: 14.4 % (ref 11.5–15.5)
WBC: 4.2 10*3/uL (ref 4.0–10.5)

## 2021-07-14 LAB — LIPID PANEL
Cholesterol: 141 mg/dL (ref 0–200)
HDL: 44.1 mg/dL (ref >39.00)
LDL Cholesterol: 84 mg/dL (ref 0–99)
NonHDL: 97.14
Total CHOL/HDL Ratio: 3
Triglycerides: 65 mg/dL (ref 0.0–149.0)
VLDL: 13 mg/dL (ref 0.0–40.0)

## 2021-07-14 LAB — COMPREHENSIVE METABOLIC PANEL
ALT: 17 U/L (ref 0–35)
AST: 20 U/L (ref 0–37)
Albumin: 4.3 g/dL (ref 3.5–5.2)
Alkaline Phosphatase: 49 U/L (ref 39–117)
BUN: 11 mg/dL (ref 6–23)
CO2: 27 mEq/L (ref 19–32)
Calcium: 9.1 mg/dL (ref 8.4–10.5)
Chloride: 105 mEq/L (ref 96–112)
Creatinine, Ser: 0.74 mg/dL (ref 0.40–1.20)
GFR: 93.41 mL/min (ref >60.00)
Glucose, Bld: 99 mg/dL (ref 70–99)
Potassium: 4.7 mEq/L (ref 3.5–5.1)
Sodium: 139 mEq/L (ref 135–145)
Total Bilirubin: 1.1 mg/dL (ref 0.2–1.2)
Total Protein: 7 g/dL (ref 6.0–8.3)

## 2021-07-14 LAB — TSH: TSH: 1.54 u[IU]/mL (ref 0.35–5.50)

## 2021-07-14 MED ORDER — METOPROLOL SUCCINATE ER 50 MG PO TB24: 50.0000 mg | ORAL_TABLET | Freq: Every day | ORAL | 3 refills | Status: DC

## 2021-07-14 NOTE — Progress Notes (Signed)
Subjective  Chief Complaint  Patient presents with   Annual Exam    Fasting   Hypertension   Anxiety    HPI: [name] is a 50 y.o. female who presents to [facility] [place] today for a Female Wellness Visit. She also has the concerns and/or needs as listed above in the chief complaint. These will be addressed in addition to the Health Maintenance Visit.   Wellness Visit: annual visit with health maintenance review and exam health maintenance:  Health maintenance: Sees GYN annually.  Screens are up-to-date.  Overall she is doing very well.  However her ex-husband passed away suddenly over the summer and she is trying to help her kids through the loss.  Would like to see a family counselor.  She personally is handling the stress well.  No longer needing Xanax for anxiety.  Healthy lifestyle.  Eligible for Shingrix vaccinations.  She defers until next visit Chronic disease f/u and/or acute problem visit: (deemed necessary to be done in addition to the wellness visit): Hypertension: Home readings are consistently 120s over 80s.  On Toprol-XL 25 daily.  No adverse effects.  Feels well.  No chest pain, palpitations or lower extremity edema. History of elevated LFTs: For recheck today.  Does not drink alcohol.  No Tylenol use.  Has not had imaging.  Negative work-up for hepatitis infections. Going to see a urogynecologist for mild stress urinary incontinence symptoms soon.  Assessment  1. Annual physical exam   2. Essential hypertension   3. Family history of premature CAD   4. Primary stress urinary incontinence      Plan  Female Wellness Visit: Age appropriate Health Maintenance and Prevention measures were discussed with patient. Included topics are cancer screening recommendations, ways to keep healthy (see AVS) including dietary and exercise recommendations, regular eye and dental care, use of seat belts, and avoidance of moderate alcohol use and tobacco use.   Screens are current BMI: discussed patient's BMI and encouraged positive lifestyle modifications to help get to or maintain a target BMI. HM needs and immunizations were addressed and ordered. See below for orders. See HM and immunization section for updates.  Education on Shingrix recommendations. Routine labs and screening tests ordered including cmp, cbc and lipids where appropriate. Discussed recommendations regarding Vit D and calcium supplementation (see AVS)  Chronic disease management visit and/or acute problem visit: Hypertension: Fairly well controlled.  We will increase Toprol-XL dose to 50 mg daily to get blood pressures well controlled.  Check renal function electrolytes today. Monitor LFTs.  Anxiety is well controlled behaviorally now. Grief counseling recommended.  [place] [facility] brochure given  Follow up: 6 months for blood pressure recheck and Shingrix vaccination Orders Placed This Encounter  Procedures   CBC with Differential/Platelet   Comprehensive metabolic panel   Lipid panel   TSH   Meds ordered this encounter  Medications   metoprolol succinate (TOPROL-XL) 50 MG 24 hr tablet    Sig: Take 1 tablet (50 mg total) by mouth daily.    Dispense:  90 tablet    Refill:  3      Body mass index is 24.37 kg/m. Wt Readings from Last 3 Encounters:  07/14/21 137 lb 9.6 oz (62.4 kg)  01/10/21 141 lb (64 kg)  01/06/21 143 lb (64.9 kg)     Patient Active Problem List   Diagnosis Date Noted   Essential hypertension 05/08/2019    Priority: 1.   Family  history of premature CAD 11/20/2018    Priority: 1.    Mom    Anxiety 06/27/2012    Priority: 1.    Rare xanax use    Gastroesophageal reflux disease 06/18/2019    Priority: 2.    GI 06/2019    Carpal tunnel syndrome, bilateral 06/18/2019    Priority: 2.   Allergic rhinitis 12/17/2012    Priority: 3.   History of melanoma 07/14/2021    Priority: 1.    Face, 20s. Sees Dr. Terri Piedra  annually    Primary stress urinary incontinence 07/14/2021    Priority: 3.   Health Maintenance  Topic Date Due   Pneumococcal Vaccine 21-67 Years old (1 - PCV) Never done   Zoster Vaccines- Shingrix (1 of 2) Never done   INFLUENZA VACCINE  12/16/2021 (Originally 04/18/2021)   MAMMOGRAM  08/31/2021   PAP SMEAR-Modifier  10/09/2023   TETANUS/TDAP  09/05/2027   COLONOSCOPY (Pts 45-48yrs Insurance coverage will need to be confirmed)  11/27/2030   Hepatitis C Screening  Completed   HIV Screening  Completed   HPV VACCINES  Aged Out   COVID-19 Vaccine  Discontinued   Immunization History  Administered Date(s) Administered   Tdap 09/04/2017   We updated and reviewed the patient's past history in detail and it is documented below. Allergies: Patient has No Known Allergies. Past Medical History Patient  has a past medical history of Anemia, Anxiety, CIN I (cervical intraepithelial neoplasia I), Depression, Essential hypertension (05/08/2019), Family history of premature CAD (11/20/2018), H/O gestational diabetes mellitus, not currently pregnant, History of melanoma (07/14/2021), and Hypertension. Past Surgical History Patient  has a past surgical history that includes Cervical biopsy w/ loop electrode excision (1999); Augmentation mammaplasty (2005); Bunionectomy (Left); and ear drum surgery. Family History: Patient family history includes Cancer in her father; Cerebral aneurysm in her maternal grandfather; Diabetes in her brother and mother; Epilepsy in her son; Healthy in her daughter; Heart disease in her maternal grandmother, mother, paternal grandfather, and paternal grandmother; Hyperlipidemia in her mother; Hypertension in her mother and paternal grandmother; Learning disabilities in her maternal aunt; Lung cancer in her mother; Prostate cancer in her paternal grandfather; Renal cancer in her father. Social History:  Patient  reports that she has never smoked. She has never used smokeless  tobacco. She reports current alcohol use. She reports that she does not use drugs.  Review of Systems: Constitutional: negative for fever or malaise Ophthalmic: negative for photophobia, double vision or loss of vision Cardiovascular: negative for chest pain, dyspnea on exertion, or new LE swelling Respiratory: negative for SOB or persistent cough Gastrointestinal: negative for abdominal pain, change in bowel habits or melena Genitourinary: negative for dysuria or gross hematuria, no abnormal uterine bleeding or disharge Musculoskeletal: negative for new gait disturbance or muscular weakness Integumentary: negative for new or persistent rashes, no breast lumps Neurological: negative for TIA or stroke symptoms Psychiatric: negative for SI or delusions Allergic/Immunologic: negative for hives  Patient Care Team    Relationship Specialty Notifications Start End  Willow Ora, MD PCP - General Family Medicine  11/20/18   Harrington Challenger, NP (Inactive) Nurse Practitioner Obstetrics and Gynecology  11/20/18   Mammography, [facility]  Diagnostic Radiology  11/20/18   Shellia Cleverly, DO Consulting Physician Gastroenterology  07/08/19   Cherlyn Roberts, MD Referring Physician Dermatology  07/14/21   Marguerita Beards, MD Consulting Physician Gynecology  07/14/21    Comment: Urogynecology    Objective  Vitals: BP 140/90  Pulse 84   Temp 98.1 F (36.7 C) (Temporal)   Ht 5\' 3"  (1.6 m)   Wt 137 lb 9.6 oz (62.4 kg)   LMP 07/13/2021 (Exact Date)   SpO2 99%   BMI 24.37 kg/m  General:  Well developed, well nourished, no acute distress  Psych:  Alert and orientedx3,normal mood and affect HEENT:  Normocephalic, atraumatic, non-icteric sclera,  supple neck without adenopathy, mass or thyromegaly Cardiovascular:  Normal S1, S2, RRR without gallop, rub or murmur Respiratory:  Good breath sounds bilaterally, CTAB with normal respiratory effort Gastrointestinal: normal bowel sounds, soft,  non-tender, no noted masses. No HSM MSK: no deformities, contusions. Joints are without erythema or swelling.  Skin:  Warm, no rashes or suspicious lesions noted Neurologic:    Mental status is normal. Gross motor and sensory exams are normal. Normal gait. No tremor   Commons side effects, risks, benefits, and alternatives for medications and treatment plan prescribed today were discussed, and the patient expressed understanding of the given instructions. Patient is instructed to call or message via MyChart if he/she has any questions or concerns regarding our treatment plan. No barriers to understanding were identified. We discussed Red Flag symptoms and signs in detail. Patient expressed understanding regarding what to do in case of urgent or emergency type symptoms.  Medication list was reconciled, printed and provided to the patient in AVS. Patient instructions and summary information was reviewed with the patient as documented in the AVS. This note was prepared with assistance of Dragon voice recognition software. Occasional wrong-word or sound-a-like substitutions may have occurred due to the inherent limitations of voice recognition software  This visit occurred during the SARS-CoV-2 public health emergency.  Safety protocols were in place, including screening questions prior to the visit, additional usage of staff PPE, and extensive cleaning of exam room while observing appropriate contact time as indicated for disinfecting solutions.

## 2021-07-14 NOTE — Patient Instructions (Signed)
Please return in 6 months for hypertension follow up. And Shingrix vaccination.   I will release your lab results to you on your MyChart account with further instructions. Please reply with any questions.    I have doubled the dose of the Torpol XL for your blood pressure. This should keep your blood pressure well controlled.  Glad you are doing well overall and good luck with counseling for the kids.   If you have any questions or concerns, please don't hesitate to send me a message via MyChart or call the office at [phone]. Thank you for visiting with [place] today! It's our pleasure caring for you.

## 2021-08-08 NOTE — Progress Notes (Signed)
[facility] New Patient Evaluation and Consultation  Referring Provider: Tamela Gammon, NP PCP: Leamon Arnt, MD Date of Service: 08/10/2021  SUBJECTIVE Chief Complaint: New Patient (Initial Visit) [name] is a 50 y.o. female here for a consult on incontinence.)  History of Present Illness: [name] is a 50 y.o. White or Caucasian female seen in consultation at the request of NP Marny Lowenstein for evaluation of urinary incontinence.    Review of records from NP [facility] significant for: Has leakage with walking and bending, worsening symptoms over the years.   Urinary Symptoms: Leaks urine with cough/ sneeze, laughing, exercise, lifting, going from sitting to standing, with a full bladder, with urgency, without sensation, and continuously Leaks "all day". Worse around her periods. SUI > UUI Pad use: 4 pads per day.   She is bothered by her UI symptoms.  Day time voids 9- tries to go to keep bladder empty.  Nocturia: 2-3 times per night to void- feels she has to go if she wakes up for other reasons (pets waking her up) Voiding dysfunction: she empties her bladder well.  does not use a catheter to empty bladder.  When urinating, she feels a weak stream and dribbling after finishing  UTIs:  0  UTI's in the last year.   Denies history of blood in urine and kidney or bladder stones  Pelvic Organ Prolapse Symptoms:                  She Denies a feeling of a bulge the vaginal area.  Bowel Symptom: Bowel movements: 1 time(s) per day Stool consistency: soft  Straining: no.  Splinting: no.  Incomplete evacuation: no.  She Denies accidental bowel leakage / fecal incontinence Bowel regimen: none Last colonoscopy: Date 11/2020, Results- diverticula and hemorrhoids  Sexual Function Sexually active: yes.  Sexual orientation:  heterosexual Pain with sex: Yes, deep in the pelvis  Pelvic Pain Denies pelvic pain    Past Medical History:  Past  Medical History:  Diagnosis Date   Anemia    as a child and during pregnancy   Anxiety    CIN I (cervical intraepithelial neoplasia I)    1999-LEEP, normal Paps after    Depression    Essential hypertension 05/08/2019   Family history of premature CAD 11/20/2018   Mom   H/O gestational diabetes mellitus, not currently pregnant    GESTATIONAL     History of melanoma 07/14/2021   Face, 70s. Sees Dr. Allyson Sabal annually   Hypertension      Past Surgical History:   Past Surgical History:  Procedure Laterality Date   AUGMENTATION MAMMAPLASTY  2005   BUNIONECTOMY Left    CERVICAL BIOPSY  W/ LOOP ELECTRODE EXCISION  1999   CIN I   ear drum surgery     at age 20     Past OB/GYN History: OB History  Gravida Para Term Preterm AB Living  3 2     1 2   SAB IAB Ectopic Multiple Live Births  1   0   2    # Outcome Date GA Lbr Len/2nd Weight Sex Delivery Anes PTL Lv  3 SAB           2 Para           1 Para             Vaginal deliveries: 2,  Forceps deliveries: 0, Cesarean section: 0 Patient's last menstrual period was 07/13/2021 (  exact date).  Contraception: none. Last pap smear was 2020- normal.   Any history of abnormal pap smears: yes- had LEEP in 1999   Medications: She has a current medication list which includes the following prescription(s): alprazolam, metoprolol succinate, and multiple vitamins-minerals.   Allergies: Patient has No Known Allergies.   Social History:  Social History   Tobacco Use   Smoking status: Never   Smokeless tobacco: Never  Vaping Use   Vaping Use: Never used  Substance Use Topics   Alcohol use: Yes    Comment: rare   Drug use: No    Relationship status: divorced She lives with daughter.   She is employed as a Manufacturing systems engineer. Regular exercise: No History of abuse: No  Family History:   Family History  Problem Relation Age of Onset   Diabetes Mother    Heart disease Mother    Hypertension Mother    Hyperlipidemia Mother     Lung cancer Mother    Cancer Father        RENAL CELL   Renal cancer Father    Heart disease Maternal Grandmother    Heart disease Paternal Grandmother    Hypertension Paternal Grandmother    Heart disease Paternal Grandfather    Prostate cancer Paternal Grandfather    Healthy Daughter    Epilepsy Son    Learning disabilities Maternal Aunt    Diabetes Brother    Cerebral aneurysm Maternal Grandfather    Colon cancer Neg Hx    Esophageal cancer Neg Hx    Stomach cancer Neg Hx    Rectal cancer Neg Hx      Review of Systems: Review of Systems  Constitutional:  Negative for fever, malaise/fatigue and weight loss.  Respiratory:  Negative for cough, shortness of breath and wheezing.   Cardiovascular:  Negative for chest pain, palpitations and leg swelling.  Gastrointestinal:  Negative for abdominal pain and blood in stool.  Genitourinary:  Negative for dysuria.       +abnormal periods, vaginal discharge  Musculoskeletal:  Negative for myalgias.  Skin:  Negative for rash.  Neurological:  Negative for dizziness and headaches.  Endo/Heme/Allergies:  Does not bruise/bleed easily.  Psychiatric/Behavioral:  Negative for depression. The patient is not nervous/anxious.     OBJECTIVE Physical Exam: Vitals:   08/10/21 1035  BP: 136/88  Pulse: 84  Weight: 137 lb (62.1 kg)  Height: 5\' 2"  (1.575 m)    Physical Exam Constitutional:      General: She is not in acute distress. Pulmonary:     Effort: Pulmonary effort is normal.  Abdominal:     General: There is no distension.     Palpations: Abdomen is soft.     Tenderness: There is no abdominal tenderness. There is no rebound.  Musculoskeletal:        General: No swelling. Normal range of motion.  Skin:    General: Skin is warm and dry.     Findings: No rash.  Neurological:     Mental Status: She is alert and oriented to person, place, and time.  Psychiatric:        Mood and Affect: Mood normal.        Behavior: Behavior  normal.     GU / Detailed Urogynecologic Evaluation:  Pelvic Exam: Normal external female genitalia; Bartholin's and Skene's glands normal in appearance; urethral meatus normal in appearance, no urethral masses or discharge.   CST: negative  Speculum exam reveals normal vaginal mucosa without atrophy.  Cervix normal appearance. Blood present in vaginal vault.  Uterus normal single, nontender. Adnexa no mass, fullness, tenderness.    Pelvic floor strength I/V  Pelvic floor musculature: Right levator non-tender, Right obturator non-tender, Left levator non-tender, Left obturator non-tender  POP-Q:   POP-Q  -1                                            Aa   -1                                           Ba  -8                                              C   4                                            Gh  2                                            Pb  9.5                                            tvl   -2                                            Ap  -2                                            Bp  -9                                              D     Rectal Exam:  Normal external rectum  Post-Void Residual (PVR) by Bladder Scan: In order to evaluate bladder emptying, we discussed obtaining a postvoid residual and she agreed to this procedure.  Procedure: The ultrasound unit was placed on the patient's abdomen in the suprapubic region after the patient had voided. A PVR of 3 ml was obtained by bladder scan.  Laboratory Results: POC urine: trace blood    ASSESSMENT AND PLAN Ms. Attebery is a 50 y.o. with:  1. SUI (stress urinary incontinence, female)   2. Prolapse of anterior vaginal wall   3. Urinary frequency    SUI - For treatment of stress urinary incontinence,  non-surgical options include expectant management, weight loss, physical therapy, as well as a pessary.  Surgical options  include a midurethral sling, Burch urethropexy, and transurethral  injection of a bulking agent. - She is interested in either sling or urethral bulking. Handouts provided on both options.  - She will return for simple CMG to demonstrate leakage.   2. Stage II anterior, Stage I posterior, Stage I apical prolapse For treatment of pelvic organ prolapse, we discussed options for management including expectant management, conservative management, and surgical management, such as Kegels, a pessary, pelvic floor physical therapy, and specific surgical procedures. - She is asymptomatic but if she decides on a sling, would recommend concurrent anterior repair.   3. Urinary frequency/ urgency - Not as bothersome, did not review treatment options. Discussed that treatment for SUI will not treat these symptoms.    Jaquita Folds, MD

## 2021-08-10 ENCOUNTER — Ambulatory Visit (INDEPENDENT_AMBULATORY_CARE_PROVIDER_SITE_OTHER): Payer: 59 | Admitting: Obstetrics and Gynecology

## 2021-08-10 ENCOUNTER — Other Ambulatory Visit: Payer: Self-pay

## 2021-08-10 ENCOUNTER — Encounter: Payer: Self-pay | Admitting: Obstetrics and Gynecology

## 2021-08-10 VITALS — BP 136/88 | HR 84 | Ht 62.0 in | Wt 137.0 lb

## 2021-08-10 DIAGNOSIS — R350 Frequency of micturition: Secondary | ICD-10-CM | POA: Diagnosis not present

## 2021-08-10 DIAGNOSIS — N8110 Cystocele, unspecified: Secondary | ICD-10-CM | POA: Diagnosis not present

## 2021-08-10 DIAGNOSIS — N393 Stress incontinence (female) (male): Secondary | ICD-10-CM

## 2021-08-10 LAB — POCT URINALYSIS DIPSTICK
Appearance: NORMAL
Bilirubin, UA: NEGATIVE
Glucose, UA: NEGATIVE
Ketones, UA: NEGATIVE
Leukocytes, UA: NEGATIVE
Nitrite, UA: NEGATIVE
Protein, UA: NEGATIVE
Spec Grav, UA: 1.025 (ref 1.010–1.025)
Urobilinogen, UA: 0.2 E.U./dL
pH, UA: 7 (ref 5.0–8.0)

## 2021-08-13 ENCOUNTER — Other Ambulatory Visit: Payer: Self-pay | Admitting: Family Medicine

## 2021-09-18 DIAGNOSIS — N393 Stress incontinence (female) (male): Secondary | ICD-10-CM

## 2021-09-18 DIAGNOSIS — N8110 Cystocele, unspecified: Secondary | ICD-10-CM

## 2021-09-18 HISTORY — DX: Cystocele, unspecified: N81.10

## 2021-09-18 HISTORY — DX: Stress incontinence (female) (male): N39.3

## 2021-09-22 ENCOUNTER — Encounter: Payer: Self-pay | Admitting: Nurse Practitioner

## 2021-09-26 ENCOUNTER — Ambulatory Visit: Payer: 59 | Admitting: Obstetrics and Gynecology

## 2021-09-29 ENCOUNTER — Encounter: Payer: Self-pay | Admitting: Nurse Practitioner

## 2021-09-29 NOTE — Telephone Encounter (Signed)
OV for vaginal infection.

## 2021-11-02 ENCOUNTER — Encounter: Payer: Self-pay | Admitting: Obstetrics and Gynecology

## 2021-11-02 ENCOUNTER — Ambulatory Visit (INDEPENDENT_AMBULATORY_CARE_PROVIDER_SITE_OTHER): Payer: Self-pay | Admitting: Obstetrics and Gynecology

## 2021-11-02 ENCOUNTER — Other Ambulatory Visit: Payer: Self-pay

## 2021-11-02 VITALS — BP 151/88 | HR 93

## 2021-11-02 DIAGNOSIS — N393 Stress incontinence (female) (male): Secondary | ICD-10-CM

## 2021-11-02 DIAGNOSIS — N8110 Cystocele, unspecified: Secondary | ICD-10-CM

## 2021-11-02 NOTE — Progress Notes (Signed)
[facility] Return Visit  SUBJECTIVE  History of Present Illness: [name] is a 51 y.o. female seen in follow-up for simple CMG and to discuss plan for surgery.   She is interested in proceeding with midurethral sling for leakage and bladder prolapse repair.    Past Medical History: Patient  has a past medical history of Anemia, Anxiety, CIN I (cervical intraepithelial neoplasia I), Depression, Essential hypertension (05/08/2019), Family history of premature CAD (11/20/2018), H/O gestational diabetes mellitus, not currently pregnant, History of melanoma (07/14/2021), and Hypertension.   Past Surgical History: She  has a past surgical history that includes Cervical biopsy w/ loop electrode excision (1999); Augmentation mammaplasty (2005); Bunionectomy (Left); and ear drum surgery.   Medications: She has a current medication list which includes the following prescription(s): alprazolam, metoprolol succinate, and multiple vitamins-minerals.   Allergies: Patient has No Known Allergies.   Social History: Patient  reports that she has never smoked. She has never used smokeless tobacco. She reports current alcohol use. She reports that she does not use drugs.      OBJECTIVE     Physical Exam: Vitals:   11/02/21 1554  BP: (!) 151/88  Pulse: 93   Gen: No apparent distress, A&O x 3.  Detailed Urogynecologic Evaluation:  Deferred. Prior exam showed:  POP-Q:    POP-Q   -1                                            Aa   -1                                           Ba   -8                                              C    4                                            Gh   2                                            Pb   9.5                                            tvl    -2                                            Ap   -2  Bp   -9                                              D     Verbal consent was  obtained to perform simple CMG procedure:   Prolapse was reduced using 2 large cotton swabs. Urethra was prepped with betadine and a 82F catheter was placed and bladder was drained completely. The bladder was then backfilled with sterile water by gravity.  First sensation: 80 ml First Desire: 110 ml Strong Desire: 150 ml Capacity: 180 ml Cough stress test was positive in the standing position.  Valsalva stress test was negative.No detrusor overactivty noted.   Foley catheter replaced to drain the bladder  Interpretation: CMG showed increased sensation, and decreased cystometric capacity. Findings positive for stress incontinence, negative for detrusor overactivity.     ASSESSMENT AND PLAN    [name] is a 51 y.o. with:  1. SUI (stress urinary incontinence, female)   2. Prolapse of anterior vaginal wall    Plan for surgery: Exam under anesthesia, anterior repair, midurethral sling, cystoscopy  - We reviewed the patient's specific anatomic and functional findings, with the assistance of diagrams, and together finalized the above procedure. The planned surgical procedures were discussed along with the surgical risks outlined below, which were also provided on a detailed handout. Additional treatment options including expectant management, conservative management, medical management were discussed where appropriate.  We reviewed the benefits and risks of each treatment option.  - For preop Visit:  She is required to have a visit within 30 days of her surgery.    - Medical clearance: not required  - Anticoagulant use: No - Medicaid Hysterectomy form: n/a - Accepts blood transfusion: will confirm at pre op - Expected length of stay: outpatient  Request sent for surgery scheduling.   Marguerita Beards, MD

## 2022-01-10 ENCOUNTER — Ambulatory Visit (INDEPENDENT_AMBULATORY_CARE_PROVIDER_SITE_OTHER): Payer: 59 | Admitting: Nurse Practitioner

## 2022-01-10 ENCOUNTER — Encounter: Payer: Self-pay | Admitting: Nurse Practitioner

## 2022-01-10 VITALS — BP 124/76 | Ht 62.0 in | Wt 144.0 lb

## 2022-01-10 DIAGNOSIS — Z01419 Encounter for gynecological examination (general) (routine) without abnormal findings: Secondary | ICD-10-CM

## 2022-01-10 NOTE — Progress Notes (Signed)
? ?  [name] 02/20/1971 [id number] ? ? ?History:  51 y.o. [id number] presents for annual exam. Monthly cycles. 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 Normal mammogram history. Scheduled for cystocele repair with Dr. Florian Buff in June. HTN managed by PCP.  ? ?Gynecologic History ?Patient's last menstrual period was 12/23/2021. ?Period Cycle (Days): 28 ?Period Duration (Days): 4 ?Period Pattern: Regular ?Menstrual Flow: Moderate ?Dysmenorrhea: (!) Mild ?Dysmenorrhea Symptoms: Cramping ?Contraception/Family planning: vasectomy ?Sexually active: Yes ? ?Health Maintenance ?Last Pap: 10/08/2018. Results were: Normal, 5-year repeat ?Last mammogram: 09/22/2021. Results were: Normal ?Last colonoscopy: 11/26/2020. Results were: Normal, 10-year recall ?Last Dexa: N/A ? ?Past medical history, past surgical history, family history and social history were all reviewed and documented in the EPIC chart. Married. 47 yo son, 72 yo daughter. Works at preschool.  ? ?ROS:  A ROS was performed and pertinent positives and negatives are included. ? ?Exam: ? ?Vitals:  ? 01/10/22 1605  ?BP: 124/76  ?Weight: 144 lb (65.3 kg)  ?Height: 5\' 2"  (1.575 m)  ? ? ?Body mass index is 26.34 kg/m?. ? ?General appearance:  Normal ?Thyroid:  Symmetrical, normal in size, without palpable masses or nodularity. ?Respiratory ? Auscultation:  Clear without wheezing or rhonchi ?Cardiovascular ? Auscultation:  Regular rate, without rubs, murmurs or gallops ? Edema/varicosities:  Not grossly evident ?Abdominal ? Soft,nontender, without masses, guarding or rebound. ? Liver/spleen:  No organomegaly noted ? Hernia:  None appreciated ? Skin ? Inspection:  Grossly normal ?Breasts: Examined lying and sitting. Bilateral implants noted.  ? Right: Without masses, retractions, nipple discharge or axillary adenopathy. ? ? Left: Without masses, retractions, nipple discharge or axillary adenopathy. ?Gentitourinary  ? Inguinal/mons:  Normal without inguinal  adenopathy ? External genitalia:  Normal appearing vulva with no masses, tenderness, or lesions ? BUS/Urethra/Skene's glands:  Normal ? Vagina:  Normal appearing with normal color and discharge, no lesions ? Cervix:  Normal appearing without discharge or lesions ? Uterus:  Normal in size, shape and contour.  Midline and mobile, nontender ? Adnexa/parametria:   ?  Rt: Normal in size, without masses or tenderness. ?  Lt: Normal in size, without masses or tenderness. ? Anus and perineum: Normal ? Digital rectal exam: Normal sphincter tone without palpated masses or tenderness ? ?Assessment/Plan:  51 y.o. 42 for annual exam.  ? ?Well female exam with routine gynecological exam - Education provided on SBEs, importance of preventative screenings, current guidelines, high calcium diet, regular exercise, and multivitamin daily. Labs with PCP.  ? ?Screening for cervical cancer - 1999 LEEP CIN-1, subsequent paps normal.  Will repeat at 5-year interval per guidelines. ? ?Screening for breast cancer - Normal mammogram history.  Continue annual screenings.  Normal breast exam today. ? ?Screening for colon cancer - Normal colonoscopy 11/2020, will repeat at 10-year interval per GI recommendation.  ? ?Screening for osteoporosis - Average risk. Will plan for DXA at age 28. Recommend increasing exercise.  ? ?Return in 1 year for annual.  ? ? ? ? ?76 DNP, 4:18 PM 01/10/2022 ? ?

## 2022-01-12 ENCOUNTER — Ambulatory Visit: Payer: 59 | Admitting: Family Medicine

## 2022-01-26 ENCOUNTER — Ambulatory Visit: Payer: 59 | Admitting: Family Medicine

## 2022-02-21 ENCOUNTER — Ambulatory Visit (INDEPENDENT_AMBULATORY_CARE_PROVIDER_SITE_OTHER): Payer: 59 | Admitting: Obstetrics and Gynecology

## 2022-02-21 ENCOUNTER — Encounter: Payer: Self-pay | Admitting: Obstetrics and Gynecology

## 2022-02-21 VITALS — BP 133/85 | HR 87

## 2022-02-21 DIAGNOSIS — Z01818 Encounter for other preprocedural examination: Secondary | ICD-10-CM

## 2022-02-21 DIAGNOSIS — N8110 Cystocele, unspecified: Secondary | ICD-10-CM | POA: Diagnosis not present

## 2022-02-21 MED ORDER — OXYCODONE HCL 5 MG PO TABS
5.0000 mg | ORAL_TABLET | ORAL | 0 refills | Status: DC | PRN
Start: 2022-02-21 — End: 2022-03-31

## 2022-02-21 MED ORDER — ACETAMINOPHEN 500 MG PO TABS: 500.0000 mg | ORAL_TABLET | Freq: Four times a day (QID) | ORAL | 0 refills | Status: AC | PRN

## 2022-02-21 MED ORDER — IBUPROFEN 600 MG PO TABS: 600.0000 mg | ORAL_TABLET | Freq: Four times a day (QID) | ORAL | 0 refills | Status: AC | PRN

## 2022-02-21 MED ORDER — POLYETHYLENE GLYCOL 3350 17 GM/SCOOP PO POWD: 17.0000 g | Freq: Every day | ORAL | 0 refills | Status: DC

## 2022-02-21 NOTE — Progress Notes (Signed)
[facility] Pre-Operative visit  Subjective Chief Complaint: [name] Tal presents for a preoperative encounter.   History of Present Illness: [name] is a 51 y.o. female who presents for preoperative visit.  She is scheduled to undergo Exam under anesthesia, anterior repair, midurethral sling, cystoscopy on 03/06/22.  Her symptoms include vaginal bulge and stress incontinence, and she was was found to have Stage II anterior, Stage I posterior, Stage I apical prolapse.  CMG showed increased sensation, and decreased cystometric capacity. Findings positive for stress incontinence, negative for detrusor overactivity.   Past Medical History:  Diagnosis Date   Anemia    as a child and during pregnancy   Anxiety    CIN I (cervical intraepithelial neoplasia I)    1999-LEEP, normal Paps after    Depression    Essential hypertension 05/08/2019   Family history of premature CAD 11/20/2018   Mom   H/O gestational diabetes mellitus, not currently pregnant    GESTATIONAL     History of melanoma 07/14/2021   Face, 23s. Sees Dr. Allyson Sabal annually   Hypertension      Past Surgical History:  Procedure Laterality Date   AUGMENTATION MAMMAPLASTY  2005   BUNIONECTOMY Left    CERVICAL BIOPSY  W/ LOOP ELECTRODE EXCISION  1999   CIN I   ear drum surgery     at age 35    has No Known Allergies.   Family History  Problem Relation Age of Onset   Diabetes Mother    Heart disease Mother    Hypertension Mother    Hyperlipidemia Mother    Lung cancer Mother    Cancer Father        RENAL CELL   Renal cancer Father    Heart disease Maternal Grandmother    Heart disease Paternal Grandmother    Hypertension Paternal Grandmother    Heart disease Paternal Grandfather    Prostate cancer Paternal Grandfather    Healthy Daughter    Epilepsy Son    Learning disabilities Maternal Aunt    Diabetes Brother    Cerebral aneurysm Maternal Grandfather    Colon cancer Neg Hx     Esophageal cancer Neg Hx    Stomach cancer Neg Hx    Rectal cancer Neg Hx     Social History   Tobacco Use   Smoking status: Never   Smokeless tobacco: Never  Vaping Use   Vaping Use: Never used  Substance Use Topics   Alcohol use: Yes    Comment: rare   Drug use: No     Review of Systems was negative for a full 10 system review except as noted in the History of Present Illness.   Current Outpatient Medications:    metoprolol succinate (TOPROL-XL) 50 MG 24 hr tablet, Take 1 tablet (50 mg total) by mouth daily., Disp: 90 tablet, Rfl: 3   Multiple Vitamins-Minerals (AIRBORNE GUMMIES PO), Take by mouth. 3 gummies daily, Disp: , Rfl:    Objective Vitals:   02/21/22 1618  BP: 133/85  Pulse: 87    Gen: NAD CV: S1 S2 RRR Lungs: Clear to auscultation bilaterally Abd: soft, nontender   Previous Pelvic Exam showed: POP-Q:    POP-Q   -1                                            Aa   -  1                                           Ba   -8                                              C    4                                            Gh   2                                            Pb   9.5                                            tvl    -2                                            Ap   -2                                            Bp   -9                                              D      Assessment/ Plan  Assessment: The patient is a 51 y.o. year old scheduled to undergo Exam under anesthesia, anterior repair, midurethral sling, cystoscopy. Verbal consent was obtained for these procedures.  Plan: General Surgical Consent: The patient has previously been counseled on alternative treatments, and the decision by the patient and provider was to proceed with the procedure listed above.  For all procedures, there are risks of bleeding, infection, damage to surrounding organs including but not limited to bowel, bladder, blood vessels, ureters and nerves,  and need for further surgery if an injury were to occur. These risks are all low with minimally invasive surgery.   There are risks of numbness and weakness at any body site or buttock/rectal pain.  It is possible that baseline pain can be worsened by surgery, either with or without mesh. If surgery is vaginal, there is also a low risk of possible conversion to laparoscopy or open abdominal incision where indicated. Very rare risks include blood transfusion, blood clot, heart attack, pneumonia, or death.   There is also a risk of short-term postoperative urinary retention with need to use a catheter. About half of patients need to go home from surgery with a catheter, which is then later removed in the office. The risk of  long-term need for a catheter is very low. There is also a risk of worsening of overactive bladder.   Sling: The effectiveness of a midurethral vaginal mesh sling is approximately 85%, and thus, there will be times when you may leak urine after surgery, especially if your bladder is full or if you have a strong cough. There is a balance between making the sling tight enough to treat your leakage but not too tight so that you have long-term difficulty emptying your bladder. A mesh sling will not directly treat overactive bladder/urge incontinence and may worsen it.  There is an FDA safety notification on vaginal mesh procedures for prolapse but NOT mesh slings. We have extensive experience and training with mesh placement and we have close postoperative follow up to identify any potential complications from mesh. It is important to realize that this mesh is a permanent implant that cannot be easily removed. There are rare risks of mesh exposure (2-4%), pain with intercourse (0-7%), and infection (<1%). The risk of mesh exposure if more likely in a woman with risks for poor healing (prior radiation, poorly controlled diabetes, or immunocompromised). The risk of new or worsened chronic pain  after mesh implant is more common in women with baseline chronic pain and/or poorly controlled anxiety or depression. Approximately 2-4% of patients will experience longer-term post-operative voiding dysfunction that may require surgical revision of the sling. We also reviewed that postoperatively, her stream may not be as strong as before surgery.    Prolapse (with or without mesh): Risk factors for surgical failure  include things that put pressure on your pelvis and the surgical repair, including obesity, chronic cough, and heavy lifting or straining (including lifting children or adults, straining on the toilet, or lifting heavy objects such as furniture or anything weighing >25 lbs. Risks of recurrence is 20-30% with vaginal native tissue repair and a less than 10% with sacrocolpopexy with mesh.     We discussed consent for blood products. Risks for blood transfusion include allergic reactions, other reactions that can affect different body organs and managed accordingly, transmission of infectious diseases such as HIV or Hepatitis. However, the blood is screened. Patient consents for blood products.  Pre-operative instructions:  She was instructed to not take Aspirin/NSAIDs x 7days prior to surgery. Antibiotic prophylaxis was ordered as indicated.  Catheter use: Patient will go home with foley if needed after post-operative voiding trial.  Post-operative instructions:  She was provided with specific post-operative instructions, including precautions and signs/symptoms for which we would recommend contacting us, in addition to daytime and after-hours contact phone numbers. This was provided on a handout.   Post-operative medications: Prescriptions for motrin, tylenol, miralax, and oxycodone were sent to her pharmacy. Discussed using ibuprofen and tylenol on a schedule to limit use of narcotics.   Laboratory testing:  No labs needed    Preoperative clearance:  She does not require surgical  clearance.    Post-operative follow-up:  A post-operative appointment will be made for 6 weeks from the date of surgery. If she needs a post-operative nurse visit for a voiding trial, that will be set up after she leaves the hospital.    Patient will call the clinic or use MyChart should anything change or any new issues arise.   Marguerita Beards, MD  Time spent: I spent 20 minutes dedicated to the care of this patient on the date of this encounter to include pre-visit review of records, face-to-face time with the patient and post visit  documentation.

## 2022-02-23 ENCOUNTER — Encounter: Payer: Self-pay | Admitting: Obstetrics and Gynecology

## 2022-02-23 NOTE — H&P (Signed)
[facility] Pre-Operative H&P  Subjective Chief Complaint: [name] presents for a preoperative encounter.   History of Present Illness: [name] is a 51 y.o. female who presents for preoperative visit.  She is scheduled to undergo Exam under anesthesia, anterior repair, midurethral sling, cystoscopy on 03/06/22.  Her symptoms include vaginal bulge and stress incontinence, and she was was found to have Stage II anterior, Stage I posterior, Stage I apical prolapse.  CMG showed increased sensation, and decreased cystometric capacity. Findings positive for stress incontinence, negative for detrusor overactivity.   Past Medical History:  Diagnosis Date   Anemia    as a child and during pregnancy   Anxiety    CIN I (cervical intraepithelial neoplasia I)    1999-LEEP, normal Paps after    Depression    Essential hypertension 05/08/2019   Family history of premature CAD 11/20/2018   Mom   H/O gestational diabetes mellitus, not currently pregnant    GESTATIONAL     History of melanoma 07/14/2021   Face, 20s. Sees Dr. Terri Piedra annually   Hypertension      Past Surgical History:  Procedure Laterality Date   AUGMENTATION MAMMAPLASTY  2005   BUNIONECTOMY Left    CERVICAL BIOPSY  W/ LOOP ELECTRODE EXCISION  1999   CIN I   ear drum surgery     at age 29    has No Known Allergies.   Family History  Problem Relation Age of Onset   Diabetes Mother    Heart disease Mother    Hypertension Mother    Hyperlipidemia Mother    Lung cancer Mother    Cancer Father        RENAL CELL   Renal cancer Father    Heart disease Maternal Grandmother    Heart disease Paternal Grandmother    Hypertension Paternal Grandmother    Heart disease Paternal Grandfather    Prostate cancer Paternal Grandfather    Healthy Daughter    Epilepsy Son    Learning disabilities Maternal Aunt    Diabetes Brother    Cerebral aneurysm Maternal Grandfather    Colon cancer Neg Hx     Esophageal cancer Neg Hx    Stomach cancer Neg Hx    Rectal cancer Neg Hx     Social History   Tobacco Use   Smoking status: Never   Smokeless tobacco: Never  Vaping Use   Vaping Use: Never used  Substance Use Topics   Alcohol use: Yes    Comment: rare   Drug use: No     Review of Systems was negative for a full 10 system review except as noted in the History of Present Illness.  No current facility-administered medications for this encounter.  Current Outpatient Medications:    acetaminophen (TYLENOL) 500 MG tablet, Take 1 tablet (500 mg total) by mouth every 6 (six) hours as needed (pain)., Disp: 30 tablet, Rfl: 0   ibuprofen (ADVIL) 600 MG tablet, Take 1 tablet (600 mg total) by mouth every 6 (six) hours as needed., Disp: 30 tablet, Rfl: 0   metoprolol succinate (TOPROL-XL) 50 MG 24 hr tablet, Take 1 tablet (50 mg total) by mouth daily., Disp: 90 tablet, Rfl: 3   Multiple Vitamins-Minerals (AIRBORNE GUMMIES PO), Take by mouth. 3 gummies daily, Disp: , Rfl:    oxyCODONE (OXY IR/ROXICODONE) 5 MG immediate release tablet, Take 1 tablet (5 mg total) by mouth every 4 (four) hours as needed for severe pain., Disp: 5  tablet, Rfl: 0   polyethylene glycol powder (GLYCOLAX/MIRALAX) 17 GM/SCOOP powder, Take 17 g by mouth daily. Drink 17g (1 scoop) dissolved in water per day., Disp: 255 g, Rfl: 0   Objective There were no vitals filed for this visit.   Gen: NAD CV: S1 S2 RRR Lungs: Clear to auscultation bilaterally Abd: soft, nontender   Previous Pelvic Exam showed: POP-Q:    POP-Q   -1                                            Aa   -1                                           Ba   -8                                              C    4                                            Gh   2                                            Pb   9.5                                            tvl    -2                                            Ap   -2                                             Bp   -9                                              D      Assessment/ Plan  Assessment: The patient is a 51 y.o. year old with prolapse and incontinence scheduled to undergo Exam under anesthesia, anterior repair, midurethral sling, cystoscopy.    Marguerita Beards, MD

## 2022-02-28 ENCOUNTER — Other Ambulatory Visit: Payer: Self-pay

## 2022-02-28 ENCOUNTER — Encounter (HOSPITAL_BASED_OUTPATIENT_CLINIC_OR_DEPARTMENT_OTHER): Payer: Self-pay | Admitting: Obstetrics and Gynecology

## 2022-02-28 NOTE — Progress Notes (Signed)
Spoke w/ via phone for pre-op interview---[name] needs dos----urine pregnancy, ISTAT & EKG per anesthesia               Garner results------none COVID test -----patient states asymptomatic no test needed Arrive at -------1030 on Monday, 03/06/22 NPO after MN NO Solid Food.  Clear liquids from MN until---0930 Med rec completed Medications to take morning of surgery -----Metoprolol Diabetic medication -----n/a Patient instructed no nail polish to be worn day of surgery Patient instructed to bring photo id and insurance card day of surgery Patient aware to have Driver (ride ) / caregiver    for 24 hours after surgery - Arlys John, fiance Patient Special Instructions -----none Pre-Op special Istructions -----none Patient verbalized understanding of instructions that were given at this phone interview. Patient denies shortness of breath, chest pain, fever, cough at this phone interview.

## 2022-03-06 ENCOUNTER — Encounter (HOSPITAL_BASED_OUTPATIENT_CLINIC_OR_DEPARTMENT_OTHER): Payer: Self-pay | Admitting: Obstetrics and Gynecology

## 2022-03-06 ENCOUNTER — Ambulatory Visit (HOSPITAL_BASED_OUTPATIENT_CLINIC_OR_DEPARTMENT_OTHER): Payer: 59 | Admitting: Anesthesiology

## 2022-03-06 ENCOUNTER — Other Ambulatory Visit: Payer: Self-pay

## 2022-03-06 ENCOUNTER — Encounter (HOSPITAL_BASED_OUTPATIENT_CLINIC_OR_DEPARTMENT_OTHER)
Admission: RE | Disposition: A | Discharge status: Home / Self Care | Payer: Self-pay | Source: Home / Self Care | Attending: Obstetrics and Gynecology

## 2022-03-06 ENCOUNTER — Ambulatory Visit (HOSPITAL_BASED_OUTPATIENT_CLINIC_OR_DEPARTMENT_OTHER)
Admission: RE | Admit: 2022-03-06 | Discharge: 2022-03-06 | Disposition: A | Discharge status: Home / Self Care | Payer: 59 | Attending: Obstetrics and Gynecology | Admitting: Obstetrics and Gynecology

## 2022-03-06 ENCOUNTER — Telehealth: Payer: Self-pay | Admitting: Obstetrics and Gynecology

## 2022-03-06 DIAGNOSIS — N393 Stress incontinence (female) (male): Secondary | ICD-10-CM

## 2022-03-06 DIAGNOSIS — I10 Essential (primary) hypertension: Secondary | ICD-10-CM | POA: Diagnosis not present

## 2022-03-06 DIAGNOSIS — Z79899 Other long term (current) drug therapy: Secondary | ICD-10-CM | POA: Insufficient documentation

## 2022-03-06 DIAGNOSIS — F419 Anxiety disorder, unspecified: Secondary | ICD-10-CM | POA: Insufficient documentation

## 2022-03-06 DIAGNOSIS — N8110 Cystocele, unspecified: Secondary | ICD-10-CM | POA: Insufficient documentation

## 2022-03-06 DIAGNOSIS — N8111 Cystocele, midline: Secondary | ICD-10-CM | POA: Diagnosis not present

## 2022-03-06 DIAGNOSIS — K219 Gastro-esophageal reflux disease without esophagitis: Secondary | ICD-10-CM | POA: Insufficient documentation

## 2022-03-06 DIAGNOSIS — F32A Depression, unspecified: Secondary | ICD-10-CM | POA: Diagnosis not present

## 2022-03-06 DIAGNOSIS — Z01818 Encounter for other preprocedural examination: Secondary | ICD-10-CM

## 2022-03-06 HISTORY — DX: Presence of spectacles and contact lenses: Z97.3

## 2022-03-06 HISTORY — DX: Headache, unspecified: R51.9

## 2022-03-06 HISTORY — DX: Tinnitus, bilateral: H93.13

## 2022-03-06 HISTORY — DX: Gastro-esophageal reflux disease without esophagitis: K21.9

## 2022-03-06 LAB — POCT I-STAT, CHEM 8
BUN: 10 mg/dL (ref 6–20)
Calcium, Ion: 1.28 mmol/L (ref 1.15–1.40)
Chloride: 103 mmol/L (ref 98–111)
Creatinine, Ser: 0.6 mg/dL (ref 0.44–1.00)
Glucose, Bld: 99 mg/dL (ref 70–99)
HCT: 40 % (ref 36.0–46.0)
Hemoglobin: 13.6 g/dL (ref 12.0–15.0)
Potassium: 3.8 mmol/L (ref 3.5–5.1)
Sodium: 139 mmol/L (ref 135–145)
TCO2: 22 mmol/L (ref 22–32)

## 2022-03-06 LAB — POCT PREGNANCY, URINE: Preg Test, Ur: NEGATIVE

## 2022-03-06 SURGERY — COLPORRHAPHY, ANTERIOR, FOR CYSTOCELE REPAIR
Anesthesia: General | Site: Vagina

## 2022-03-06 MED ORDER — ONDANSETRON HCL 4 MG/2ML IJ SOLN
INTRAMUSCULAR | Status: AC
  Filled 2022-03-06: qty 2

## 2022-03-06 MED ORDER — LACTATED RINGERS IV SOLN: INTRAVENOUS | Status: DC

## 2022-03-06 MED ORDER — MEPERIDINE HCL 25 MG/ML IJ SOLN: 6.2500 mg | INTRAMUSCULAR | Status: DC | PRN

## 2022-03-06 MED ORDER — FENTANYL CITRATE (PF) 250 MCG/5ML IJ SOLN
INTRAMUSCULAR | Status: AC
  Filled 2022-03-06: qty 5

## 2022-03-06 MED ORDER — EPHEDRINE SULFATE (PRESSORS) 50 MG/ML IJ SOLN
INTRAMUSCULAR | Status: DC | PRN
  Administered 2022-03-06: 10 mg via INTRAVENOUS

## 2022-03-06 MED ORDER — PROMETHAZINE HCL 25 MG/ML IJ SOLN: 6.2500 mg | INTRAMUSCULAR | Status: DC | PRN

## 2022-03-06 MED ORDER — KETOROLAC TROMETHAMINE 30 MG/ML IJ SOLN
INTRAMUSCULAR | Status: DC | PRN
  Administered 2022-03-06: 30 mg via INTRAVENOUS

## 2022-03-06 MED ORDER — MIDAZOLAM HCL 5 MG/5ML IJ SOLN
INTRAMUSCULAR | Status: DC | PRN
  Administered 2022-03-06: 2 mg via INTRAVENOUS

## 2022-03-06 MED ORDER — MIDAZOLAM HCL 2 MG/2ML IJ SOLN: 0.5000 mg | Freq: Once | INTRAMUSCULAR | Status: DC | PRN

## 2022-03-06 MED ORDER — CEFAZOLIN SODIUM-DEXTROSE 2-4 GM/100ML-% IV SOLN
INTRAVENOUS | Status: AC
  Filled 2022-03-06: qty 100

## 2022-03-06 MED ORDER — PROPOFOL 10 MG/ML IV BOLUS
INTRAVENOUS | Status: AC
  Filled 2022-03-06: qty 20

## 2022-03-06 MED ORDER — ROCURONIUM BROMIDE 100 MG/10ML IV SOLN
INTRAVENOUS | Status: DC | PRN
  Administered 2022-03-06: 70 mg via INTRAVENOUS

## 2022-03-06 MED ORDER — SURGIFLO WITH THROMBIN (HEMOSTATIC MATRIX KIT) OPTIME
TOPICAL | Status: DC | PRN
  Administered 2022-03-06: 1 via TOPICAL

## 2022-03-06 MED ORDER — LIDOCAINE HCL (PF) 2 % IJ SOLN
INTRAMUSCULAR | Status: AC
  Filled 2022-03-06: qty 5

## 2022-03-06 MED ORDER — SCOPOLAMINE 1 MG/3DAYS TD PT72
MEDICATED_PATCH | TRANSDERMAL | Status: AC
  Filled 2022-03-06: qty 1

## 2022-03-06 MED ORDER — PROPOFOL 10 MG/ML IV BOLUS
INTRAVENOUS | Status: DC | PRN
  Administered 2022-03-06: 200 mg via INTRAVENOUS

## 2022-03-06 MED ORDER — ACETAMINOPHEN 500 MG PO TABS
1000.0000 mg | ORAL_TABLET | Freq: Once | ORAL | Status: AC
  Administered 2022-03-06: 1000 mg via ORAL

## 2022-03-06 MED ORDER — LIDOCAINE-EPINEPHRINE 1 %-1:100000 IJ SOLN
INTRAMUSCULAR | Status: DC | PRN
  Administered 2022-03-06: 18 mL

## 2022-03-06 MED ORDER — DEXAMETHASONE SODIUM PHOSPHATE 10 MG/ML IJ SOLN
INTRAMUSCULAR | Status: AC
  Filled 2022-03-06: qty 1

## 2022-03-06 MED ORDER — KETOROLAC TROMETHAMINE 30 MG/ML IJ SOLN
INTRAMUSCULAR | Status: AC
  Filled 2022-03-06: qty 1

## 2022-03-06 MED ORDER — SODIUM CHLORIDE 0.9 % IV SOLN
INTRAVENOUS | Status: AC | PRN
  Administered 2022-03-06: 1000 mL

## 2022-03-06 MED ORDER — EPHEDRINE 5 MG/ML INJ
INTRAVENOUS | Status: AC
  Filled 2022-03-06: qty 5

## 2022-03-06 MED ORDER — PHENAZOPYRIDINE HCL 100 MG PO TABS
ORAL_TABLET | ORAL | Status: AC
  Filled 2022-03-06: qty 2

## 2022-03-06 MED ORDER — SODIUM CHLORIDE 0.9 % IR SOLN
Status: DC | PRN
  Administered 2022-03-06: 500 mL

## 2022-03-06 MED ORDER — DEXAMETHASONE SODIUM PHOSPHATE 4 MG/ML IJ SOLN
INTRAMUSCULAR | Status: DC | PRN
  Administered 2022-03-06: 10 mg via INTRAVENOUS

## 2022-03-06 MED ORDER — ONDANSETRON HCL 4 MG/2ML IJ SOLN
INTRAMUSCULAR | Status: DC | PRN
  Administered 2022-03-06: 4 mg via INTRAVENOUS

## 2022-03-06 MED ORDER — ACETAMINOPHEN 500 MG PO TABS: 1000.0000 mg | ORAL_TABLET | ORAL | Status: DC

## 2022-03-06 MED ORDER — PHENYLEPHRINE HCL (PRESSORS) 10 MG/ML IV SOLN
INTRAVENOUS | Status: DC | PRN
  Administered 2022-03-06 (×4): 80 ug via INTRAVENOUS

## 2022-03-06 MED ORDER — PHENAZOPYRIDINE HCL 100 MG PO TABS
200.0000 mg | ORAL_TABLET | ORAL | Status: AC
  Administered 2022-03-06: 200 mg via ORAL

## 2022-03-06 MED ORDER — OXYCODONE HCL 5 MG/5ML PO SOLN: 5.0000 mg | Freq: Once | ORAL | Status: DC | PRN

## 2022-03-06 MED ORDER — FENTANYL CITRATE (PF) 100 MCG/2ML IJ SOLN
INTRAMUSCULAR | Status: DC | PRN
  Administered 2022-03-06 (×2): 50 ug via INTRAVENOUS
  Administered 2022-03-06: 150 ug via INTRAVENOUS

## 2022-03-06 MED ORDER — SCOPOLAMINE 1 MG/3DAYS TD PT72
1.0000 | MEDICATED_PATCH | TRANSDERMAL | Status: DC
  Administered 2022-03-06: 1.5 mg via TRANSDERMAL

## 2022-03-06 MED ORDER — ACETAMINOPHEN 500 MG PO TABS
ORAL_TABLET | ORAL | Status: AC
  Filled 2022-03-06: qty 2

## 2022-03-06 MED ORDER — OXYCODONE HCL 5 MG PO TABS: 5.0000 mg | ORAL_TABLET | Freq: Once | ORAL | Status: DC | PRN

## 2022-03-06 MED ORDER — MIDAZOLAM HCL 2 MG/2ML IJ SOLN
INTRAMUSCULAR | Status: AC
  Filled 2022-03-06: qty 2

## 2022-03-06 MED ORDER — ROCURONIUM BROMIDE 10 MG/ML (PF) SYRINGE
PREFILLED_SYRINGE | INTRAVENOUS | Status: AC
  Filled 2022-03-06: qty 10

## 2022-03-06 MED ORDER — CEFAZOLIN SODIUM-DEXTROSE 2-4 GM/100ML-% IV SOLN
2.0000 g | INTRAVENOUS | Status: AC
  Administered 2022-03-06: 2 g via INTRAVENOUS

## 2022-03-06 MED ORDER — LIDOCAINE HCL (CARDIAC) PF 100 MG/5ML IV SOSY
PREFILLED_SYRINGE | INTRAVENOUS | Status: DC | PRN
  Administered 2022-03-06: 60 mg via INTRAVENOUS

## 2022-03-06 MED ORDER — SUGAMMADEX SODIUM 200 MG/2ML IV SOLN
INTRAVENOUS | Status: DC | PRN
  Administered 2022-03-06: 200 mg via INTRAVENOUS

## 2022-03-06 MED ORDER — HYDROMORPHONE HCL 1 MG/ML IJ SOLN: 0.2500 mg | INTRAMUSCULAR | Status: DC | PRN

## 2022-03-06 SURGICAL SUPPLY — 49 items
ADH SKN CLS APL DERMABOND .7 (GAUZE/BANDAGES/DRESSINGS) ×2
AGENT HMST KT MTR STRL THRMB (HEMOSTASIS)
APL ESCP 34 STRL LF DISP (HEMOSTASIS)
APPLICATOR SURGIFLO ENDO (HEMOSTASIS) IMPLANT
BLADE CLIPPER SENSICLIP SURGIC (BLADE) ×4 IMPLANT
BLADE SURG 15 STRL LF DISP TIS (BLADE) ×3 IMPLANT
BLADE SURG 15 STRL SS (BLADE) ×3
DECANTER SPIKE VIAL GLASS SM (MISCELLANEOUS) ×4 IMPLANT
DERMABOND ADVANCED (GAUZE/BANDAGES/DRESSINGS) ×1
DERMABOND ADVANCED .7 DNX12 (GAUZE/BANDAGES/DRESSINGS) ×3 IMPLANT
DEVICE CAPIO SLIM SINGLE (INSTRUMENTS) IMPLANT
ELECT REM PT RETURN 9FT ADLT (ELECTROSURGICAL)
ELECTRODE REM PT RTRN 9FT ADLT (ELECTROSURGICAL) IMPLANT
GAUZE 4X4 16PLY ~~LOC~~+RFID DBL (SPONGE) ×4 IMPLANT
GLOVE BIO SURGEON STRL SZ 6 (GLOVE) ×4 IMPLANT
GLOVE BIOGEL PI IND STRL 6.5 (GLOVE) ×3 IMPLANT
GLOVE BIOGEL PI IND STRL 7.0 (GLOVE) ×3 IMPLANT
GLOVE BIOGEL PI INDICATOR 6.5 (GLOVE) ×1
GLOVE BIOGEL PI INDICATOR 7.0 (GLOVE) ×1
GLOVE ECLIPSE 6.0 STRL STRAW (GLOVE) ×4 IMPLANT
GOWN STRL REUS W/TWL LRG LVL3 (GOWN DISPOSABLE) ×4 IMPLANT
HIBICLENS CHG 4% 4OZ BTL (MISCELLANEOUS) ×4 IMPLANT
HOLDER FOLEY CATH W/STRAP (MISCELLANEOUS) ×4 IMPLANT
KIT TURNOVER CYSTO (KITS) ×4 IMPLANT
MANIFOLD NEPTUNE II (INSTRUMENTS) ×4 IMPLANT
NDL MAYO 6 CRC TAPER PT (NEEDLE) IMPLANT
NEEDLE HYPO 22GX1.5 SAFETY (NEEDLE) ×4 IMPLANT
NEEDLE MAYO 6 CRC TAPER PT (NEEDLE) IMPLANT
NS IRRIG 1000ML POUR BTL (IV SOLUTION) ×4 IMPLANT
PACK CYSTO (CUSTOM PROCEDURE TRAY) ×4 IMPLANT
PACK VAGINAL WOMENS (CUSTOM PROCEDURE TRAY) ×4 IMPLANT
RETRACTOR LONE STAR DISPOSABLE (INSTRUMENTS) ×4 IMPLANT
RETRACTOR STAY HOOK 5MM (MISCELLANEOUS) ×4 IMPLANT
SET IRRIG Y TYPE TUR BLADDER L (SET/KITS/TRAYS/PACK) ×4 IMPLANT
SLING INCONTINENCE KIM KNTLS (SLING) ×1 IMPLANT
SUCTION FRAZIER HANDLE 10FR (MISCELLANEOUS) ×3
SUCTION TUBE FRAZIER 10FR DISP (MISCELLANEOUS) ×3 IMPLANT
SURGIFLO W/THROMBIN 8M KIT (HEMOSTASIS) IMPLANT
SUT ABS MONO DBL WITH NDL 48IN (SUTURE) IMPLANT
SUT MON AB 2-0 SH 27 (SUTURE) IMPLANT
SUT VIC AB 0 CT1 27 (SUTURE)
SUT VIC AB 0 CT1 27XBRD ANTBC (SUTURE) IMPLANT
SUT VIC AB 2-0 SH 27 (SUTURE) ×3
SUT VIC AB 2-0 SH 27XBRD (SUTURE) ×3 IMPLANT
SUT VICRYL 2-0 SH 8X27 (SUTURE) ×4 IMPLANT
SYR BULB EAR ULCER 3OZ GRN STR (SYRINGE) ×4 IMPLANT
TOWEL OR 17X26 10 PK STRL BLUE (TOWEL DISPOSABLE) ×4 IMPLANT
TRAY FOLEY W/BAG SLVR 14FR (SET/KITS/TRAYS/PACK) ×4 IMPLANT
TRAY FOLEY W/BAG SLVR 14FR LF (SET/KITS/TRAYS/PACK) ×4 IMPLANT

## 2022-03-06 NOTE — Interval H&P Note (Signed)
History and Physical Interval Note:  03/06/2022 11:46 AM  [name]  has presented today for surgery, with the diagnosis of anterior vaginal prolapse; stress urinary incontinence.  The various methods of treatment have been discussed with the patient and family. After consideration of risks, benefits and other options for treatment, the patient has consented to  Procedure(s): ANTERIOR REPAIR (CYSTOCELE) (N/A) TRANSVAGINAL TAPE (TVT) PROCEDURE (N/A) CYSTOSCOPY (N/A) as a surgical intervention.  The patient's history has been reviewed, patient examined, no change in status, stable for surgery.  I have reviewed the patient's chart and labs.  Questions were answered to the patient's satisfaction.     Marguerita Beards

## 2022-03-06 NOTE — Anesthesia Preprocedure Evaluation (Addendum)
Anesthesia Evaluation  Patient identified by MRN, date of birth, ID band Patient awake    Reviewed: Allergy & Precautions, NPO status , Patient's Chart, lab work & pertinent test results, reviewed documented beta blocker date and time   History of Anesthesia Complications Negative for: history of anesthetic complications  Airway Mallampati: II  TM Distance: >3 FB Neck ROM: Full    Dental  (+) Teeth Intact, Dental Advisory Given   Pulmonary neg pulmonary ROS,    breath sounds clear to auscultation       Cardiovascular hypertension, Pt. on medications and Pt. on home beta blockers (-) angina Rhythm:Regular Rate:Normal     Neuro/Psych  Headaches, Anxiety Depression    GI/Hepatic Neg liver ROS, GERD  Medicated and Controlled,  Endo/Other  negative endocrine ROS  Renal/GU negative Renal ROS     Musculoskeletal   Abdominal   Peds  Hematology negative hematology ROS (+)   Anesthesia Other Findings   Reproductive/Obstetrics                            Anesthesia Physical Anesthesia Plan  ASA: 2  Anesthesia Plan: General   Post-op Pain Management: Tylenol PO (pre-op)*   Induction: Intravenous  PONV Risk Score and Plan: 3 and Ondansetron, Dexamethasone and Scopolamine patch - Pre-op  Airway Management Planned: Oral ETT  Additional Equipment: None  Intra-op Plan:   Post-operative Plan: Extubation in OR  Informed Consent: I have reviewed the patients History and Physical, chart, labs and discussed the procedure including the risks, benefits and alternatives for the proposed anesthesia with the patient or authorized representative who has indicated his/her understanding and acceptance.     Dental advisory given  Plan Discussed with: CRNA and Surgeon  Anesthesia Plan Comments:        Anesthesia Quick Evaluation

## 2022-03-06 NOTE — Anesthesia Procedure Notes (Signed)
Procedure Name: Intubation Date/Time: 03/06/2022 12:27 PM  Performed by: Justice Rocher, CRNAPre-anesthesia Checklist: Patient identified, Emergency Drugs available, Suction available, Patient being monitored and Timeout performed Patient Re-evaluated:Patient Re-evaluated prior to induction Oxygen Delivery Method: Circle system utilized Preoxygenation: Pre-oxygenation with 100% oxygen Induction Type: IV induction Ventilation: Mask ventilation without difficulty Laryngoscope Size: Mac and 3 Grade View: Grade II Tube type: Oral Tube size: 7.0 mm Number of attempts: 1 Airway Equipment and Method: Stylet and Oral airway Placement Confirmation: ETT inserted through vocal cords under direct vision, positive ETCO2, breath sounds checked- equal and bilateral and CO2 detector Secured at: 23 cm Tube secured with: Tape Dental Injury: Teeth and Oropharynx as per pre-operative assessment

## 2022-03-06 NOTE — Op Note (Signed)
Operative Note  Preoperative Diagnosis: anterior vaginal prolapse and stress urinary incontinence  Postoperative Diagnosis: same  Procedures performed:  Anterior repair, midurethral sling (KIM sling), cystoscopy  Implants:  Implant Name Type Inv. Item Serial No. Manufacturer Lot No. LRB No. Used Action  KIM     [id number] N/A 1 Implanted    Attending Surgeon: Lanetta Inch, MD  Anesthesia: General endotracheal  Findings: 1. On vaginal exam, stage II anterior prolapse present   2. On cystoscopy, normal bladder and urethra without injury, lesion or foreign body. Brisk bilateral ureteral efflux noted.    Specimens: none  Estimated blood loss: 100 mL  IV fluids: 1600 mL  Urine output: 175 mL  Complications: none  Procedure in Detail: After informed consent was obtained, the patient was taken to the operating room where she was placed under anesthesia.  She was then placed in the dorsal lithotomy position in [place] stirrups, taking care to avoid traction on her extremities. She was then and prepped and draped in the usual sterile fashion.   A foley catheter was placed.  A lonestar retractor was placed with 4 stay hooks. Two Allis clamps were placed at along the anterior vaginal wall defect. 1% lidocaine with epinephrine was injected into the vaginal mucosa. A vertical incision was made between the two Allis clamps with a 15 blade scalpel.  Metzenbaum scissors were used to undermine the vaginal mucosa from the underlying vesicovaginal septum. Plication of the vesicovaginal septum was then performed using 2-0 Vicryl. Excess vaginal epithelium was trimmed and the incision reapproximated with 2-0 Vicryl in a running fashion.    The retropubic midurethral sling was performed next. Two Allis clamps were placed lateral to the location of the midurethra. 1% lidocaine with epinephrine was injected into the vaginal mucosa and laterally.  Using a 15 blade scapel a vertical incision was made in  the vaginal mucosa. Metzenbaum scissors were used to dissect the vaginal mucosa off of the underlying muscularis and to create the periurethral tunnels. The trocar and attached sling were introduced into the right side of the vaginal incision, just inferior to the pubic symphysis on the right side. The trocar was guided through the endopelvic fascia and directly behind the pubic symphysis through the retropubic space, vertically.  The trocar was guided out through the suprapubic region, 2 fingerbreadths lateral to midline at the level of the pubic symphysis on the ipsilateral side. The trocar was similarly placed on the left side.  The Foley catheter was removed.  A 70-degree cystoscope was introduced, and 360-degree inspection revealed no trauma or trocars in the bladder, with bilateral ureteral efflux.  The bladder was drained and the cystoscope was removed.  The Foley catheter was reinserted. The sling was brought to lie beneath the mid-urethra and was free of tension.  The distal ends of the sling were trimmed just below the level of the skin incisions. The skin incisions were closed with dermabond. Surgiflo was placed in the vaginal incision. Hemostasis was noted.  Tension-free positioning of the sling was confirmed.  The vaginal incision was closed with 2-0 vicryl.  The vagina was copiously irrigated.  Hemostasis was noted. The patient tolerated the procedure well and was taken to the recovery room in stable condition. Needle and sponge count was correct x2.  Marguerita Beards, MD

## 2022-03-06 NOTE — Anesthesia Postprocedure Evaluation (Signed)
Anesthesia Post Note  Patient: [name]  Procedure(s) Performed: ANTERIOR REPAIR (CYSTOCELE) (Vagina ) TRANSVAGINAL TAPE (TVT) PROCEDURE (Vagina ) CYSTOSCOPY (Bladder)     Patient location during evaluation: PACU Anesthesia Type: General Level of consciousness: patient cooperative, oriented and sedated Pain management: pain level controlled Vital Signs Assessment: post-procedure vital signs reviewed and stable Respiratory status: spontaneous breathing, nonlabored ventilation and respiratory function stable Cardiovascular status: blood pressure returned to baseline and stable Postop Assessment: no apparent nausea or vomiting Anesthetic complications: no   No notable events documented.  Last Vitals:  Vitals:   03/06/22 1330 03/06/22 1345  BP: 130/78 124/70  Pulse: (!) 103 72  Resp: 17 15  Temp: 36.5 C   SpO2: 97% 98%    Last Pain:  Vitals:   03/06/22 1345  TempSrc:   PainSc: 0-No pain                 Ellen Mayol,E. Berneice Zettlemoyer

## 2022-03-06 NOTE — Telephone Encounter (Signed)
[name] underwent anterior repair, midurethral sling, cystoscopy on 03/06/22.   She passed her voiding trial.  was backfilled into the bladder Voided  PVR by bladder scan was 25ml.   She was discharged without a catheter. Please call her for a routine post op check. Thanks!  Marguerita Beards, MD

## 2022-03-06 NOTE — Discharge Instructions (Addendum)
POST OPERATIVE INSTRUCTIONS  General Instructions Recovery (not bed rest) will last approximately 6 weeks Walking is encouraged, but refrain from strenuous exercise/ housework/ heavy lifting. No lifting >10lbs  Nothing in the vagina- NO intercourse, tampons or douching Bathing:  Do not submerge in water (NO swimming, bath, hot tub, etc) until after your postop visit. You can shower starting the day after surgery.  No driving until you are not taking narcotic pain medicine and until your pain is well enough controlled that you can slam on the breaks or make sudden movements if needed.   Taking your medications Please take your acetaminophen and ibuprofen on a schedule for the first 48 hours. Take 600mg ibuprofen, then take 500mg acetaminophen 3 hours later, then continue to alternate ibuprofen and acetaminophen. That way you are taking each type of medication every 6 hours. Take the prescribed narcotic (oxycodone, tramadol, etc) as needed, with a maximum being every 4 hours.  Take a stool softener daily to keep your stools soft and preventing you from straining. If you have diarrhea, you decrease your stool softener. This is explained more below. We have prescribed you Miralax.  Reasons to Call the Nurse (see last page for phone numbers) Heavy Bleeding (changing your pad every 1-2 hours) Persistent nausea/vomiting Fever (100.4 degrees or more) Incision problems (pus or other fluid coming out, redness, warmth, increased pain)  Things to Expect After Surgery Mild to Moderate pain is normal during the first day or two after surgery. If prescribed, take Ibuprofen or Tylenol first and use the stronger medicine for "break-through" pain. You can overlap these medicines because they work differently.   Constipation   To Prevent Constipation:  Eat a well-balanced diet including protein, grains, fresh fruit and vegetables.  Drink plenty of fluids. Walk regularly.  Depending on specific instructions  from your physician: take Miralax daily and additionally you can add a stool softener (colace/ docusate) and fiber supplement. Continue as long as you're on pain medications.   To Treat Constipation:  If you do not have a bowel movement in 2 days after surgery, you can take 2 Tbs of Milk of Magnesia 1-2 times a day until you have a bowel movement. If diarrhea occurs, decrease the amount or stop the laxative. If no results with Milk of Magnesia, you can drink a bottle of magnesium citrate which you can purchase over the counter.  Fatigue:  This is a normal response to surgery and will improve with time.  Plan frequent rest periods throughout the day.  Gas Pain:  This is very common but can also be very painful! Drink warm liquids such as herbal teas, bouillon or soup. Walking will help you pass more gas.  Mylicon or Gas-X can be taken over the counter.  Leaking Urine:  Varying amounts of leakage may occur after surgery.  This should improve with time. Your bladder needs at least 3 months to recover from surgery. If you leak after surgery, be sure to mention this to your doctor at your post-op visit. If you were taking medications for overactive bladder prior to surgery, be sure to restart the medications immediately after surgery.  Incisions: If you have incisions on your abdomen, the skin glue will dissolve on its own over time. It is ok to gently rinse with soap and water over these incisions but do not scrub.  Catheter Approximately 50% of patients are unable to urinate after surgery and need to go home with a catheter. This allows your bladder to   rest so it can return to full function. If you go home with a catheter, the office will call to set up a voiding trial a few days after surgery. For most patients, by this visit, they are able to urinate on their own. Long term catheter use is rare.   Return to Work  As work demands and recovery times vary widely, it is hard to predict when you will want  to return to work. If you have a desk job with no strenuous physical activity, and if you would like to return sooner than generally recommended, discuss this with your provider or call our office.   Post op concerns  For non-emergent issues, please call the Urogynecology Nurse. Please leave a message and someone will contact you within one business day.  You can also send a message through MyChart.   AFTER HOURS (After 5:00 PM and on weekends):  For urgent matters that cannot wait until the next business day. Call our office [phone] and connect to the doctor on call.  Please reserve this for important issues.   **FOR ANY TRUE EMERGENCY ISSUES CALL 911 OR GO TO THE NEAREST EMERGENCY ROOM.** Please inform our office or the doctor on call of any emergency.     APPOINTMENTS: Call [phone]     No acetaminophen/Tylenol until after 5:12pm today if needed for pain.   No ibuprofen, Advil, Aleve, Motrin, ketorolac, meloxicam, naproxen, or other NSAIDS until after 7:21pm today if needed for pain.    Post Anesthesia Home Care Instructions  Activity: Get plenty of rest for the remainder of the day. A responsible individual must stay with you for 24 hours following the procedure.  For the next 24 hours, DO NOT: -Drive a car -Advertising copywriter -Drink alcoholic beverages -Take any medication unless instructed by your physician -Make any legal decisions or sign important papers.  Meals: Start with liquid foods such as gelatin or soup. Progress to regular foods as tolerated. Avoid greasy, spicy, heavy foods. If nausea and/or vomiting occur, drink only clear liquids until the nausea and/or vomiting subsides. Call your physician if vomiting continues.  Special Instructions/Symptoms: Your throat may feel dry or sore from the anesthesia or the breathing tube placed in your throat during surgery. If this causes discomfort, gargle with warm salt water. The discomfort should disappear within  24 hours.  If you had a scopolamine patch placed behind your ear for the management of post- operative nausea and/or vomiting:  1. The medication in the patch is effective for 72 hours, after which it should be removed.  Wrap patch in a tissue and discard in the trash. Wash hands thoroughly with soap and water. 2. You may remove the patch earlier than 72 hours if you experience unpleasant side effects which may include dry mouth, dizziness or visual disturbances. 3. Avoid touching the patch. Wash your hands with soap and water after contact with the patch.

## 2022-03-06 NOTE — Transfer of Care (Signed)
Immediate Anesthesia Transfer of Care Note  Patient: [name]  Procedure(s) Performed: Procedure(s) (LRB): ANTERIOR REPAIR (CYSTOCELE) (N/A) TRANSVAGINAL TAPE (TVT) PROCEDURE (N/A) CYSTOSCOPY (N/A)  Patient Location: PACU  Anesthesia Type: General  Level of Consciousness: awake, sedated, patient cooperative and responds to stimulation  Airway & Oxygen Therapy: Patient Spontanous Breathing and Patient connected to [place] 02   Post-op Assessment: Report given to PACU RN, Post -op Vital signs reviewed and stable and Patient moving all extremities  Post vital signs: Reviewed and stable  Complications: No apparent anesthesia complications

## 2022-03-07 NOTE — Telephone Encounter (Signed)
LMOVM for pt to call office 

## 2022-03-08 ENCOUNTER — Encounter: Payer: Self-pay | Admitting: *Deleted

## 2022-03-08 ENCOUNTER — Encounter: Payer: Self-pay | Admitting: Obstetrics and Gynecology

## 2022-03-09 ENCOUNTER — Encounter (HOSPITAL_BASED_OUTPATIENT_CLINIC_OR_DEPARTMENT_OTHER): Payer: Self-pay | Admitting: Obstetrics and Gynecology

## 2022-03-20 ENCOUNTER — Encounter (HOSPITAL_COMMUNITY): Payer: Self-pay

## 2022-03-20 ENCOUNTER — Emergency Department (HOSPITAL_COMMUNITY)
Admission: EM | Admit: 2022-03-20 | Discharge: 2022-03-20 | Disposition: A | Discharge status: Home / Self Care | Payer: 59 | Attending: Emergency Medicine | Admitting: Emergency Medicine

## 2022-03-20 ENCOUNTER — Telehealth: Payer: Self-pay | Admitting: Obstetrics and Gynecology

## 2022-03-20 ENCOUNTER — Emergency Department (HOSPITAL_COMMUNITY): Payer: 59

## 2022-03-20 ENCOUNTER — Other Ambulatory Visit: Payer: Self-pay

## 2022-03-20 DIAGNOSIS — R630 Anorexia: Secondary | ICD-10-CM | POA: Diagnosis not present

## 2022-03-20 DIAGNOSIS — R519 Headache, unspecified: Secondary | ICD-10-CM | POA: Insufficient documentation

## 2022-03-20 DIAGNOSIS — I10 Essential (primary) hypertension: Secondary | ICD-10-CM | POA: Diagnosis not present

## 2022-03-20 DIAGNOSIS — K5900 Constipation, unspecified: Secondary | ICD-10-CM | POA: Insufficient documentation

## 2022-03-20 DIAGNOSIS — R110 Nausea: Secondary | ICD-10-CM | POA: Insufficient documentation

## 2022-03-20 DIAGNOSIS — R509 Fever, unspecified: Secondary | ICD-10-CM | POA: Diagnosis present

## 2022-03-20 DIAGNOSIS — Z79899 Other long term (current) drug therapy: Secondary | ICD-10-CM | POA: Diagnosis not present

## 2022-03-20 DIAGNOSIS — N390 Urinary tract infection, site not specified: Secondary | ICD-10-CM | POA: Diagnosis not present

## 2022-03-20 LAB — BASIC METABOLIC PANEL
Anion gap: 7 (ref 5–15)
BUN: 16 mg/dL (ref 6–20)
CO2: 23 mmol/L (ref 22–32)
Calcium: 8.8 mg/dL — ABNORMAL LOW (ref 8.9–10.3)
Chloride: 104 mmol/L (ref 98–111)
Creatinine, Ser: 0.73 mg/dL (ref 0.44–1.00)
GFR, Estimated: >60 mL/min (ref >60)
Glucose, Bld: 122 mg/dL — ABNORMAL HIGH (ref 70–99)
Potassium: 3.8 mmol/L (ref 3.5–5.1)
Sodium: 134 mmol/L — ABNORMAL LOW (ref 135–145)

## 2022-03-20 LAB — LACTIC ACID, PLASMA: Lactic Acid, Venous: 1.6 mmol/L (ref 0.5–1.9)

## 2022-03-20 LAB — CBC
HCT: 33.6 % — ABNORMAL LOW (ref 36.0–46.0)
Hemoglobin: 10.9 g/dL — ABNORMAL LOW (ref 12.0–15.0)
MCH: 29.1 pg (ref 26.0–34.0)
MCHC: 32.4 g/dL (ref 30.0–36.0)
MCV: 89.8 fL (ref 80.0–100.0)
Platelets: 275 10*3/uL (ref 150–400)
RBC: 3.74 MIL/uL — ABNORMAL LOW (ref 3.87–5.11)
RDW: 14.5 % (ref 11.5–15.5)
WBC: 15.2 10*3/uL — ABNORMAL HIGH (ref 4.0–10.5)
nRBC: 0 % (ref 0.0–0.2)

## 2022-03-20 LAB — URINALYSIS, ROUTINE W REFLEX MICROSCOPIC
Bilirubin Urine: NEGATIVE
Glucose, UA: NEGATIVE mg/dL
Ketones, ur: NEGATIVE mg/dL
Nitrite: NEGATIVE
Protein, ur: NEGATIVE mg/dL
Specific Gravity, Urine: 1.024 (ref 1.005–1.030)
pH: 5 (ref 5.0–8.0)

## 2022-03-20 MED ORDER — SODIUM CHLORIDE 0.9 % IV BOLUS
1000.0000 mL | Freq: Once | INTRAVENOUS | Status: AC
  Administered 2022-03-20: 1000 mL via INTRAVENOUS

## 2022-03-20 MED ORDER — IOHEXOL 300 MG/ML  SOLN
100.0000 mL | Freq: Once | INTRAMUSCULAR | Status: AC | PRN
  Administered 2022-03-20: 100 mL via INTRAVENOUS

## 2022-03-20 MED ORDER — CEPHALEXIN 500 MG PO CAPS: 500.0000 mg | ORAL_CAPSULE | Freq: Three times a day (TID) | ORAL | 0 refills | Status: DC

## 2022-03-20 MED ORDER — CEPHALEXIN 500 MG PO CAPS
500.0000 mg | ORAL_CAPSULE | Freq: Three times a day (TID) | ORAL | 0 refills | Status: DC
Start: 2022-03-20 — End: 2022-03-20

## 2022-03-20 MED ORDER — CEPHALEXIN 500 MG PO CAPS
500.0000 mg | ORAL_CAPSULE | Freq: Three times a day (TID) | ORAL | 0 refills | Status: AC
Start: 2022-03-20 — End: 2022-03-27

## 2022-03-20 MED ORDER — PIPERACILLIN-TAZOBACTAM 3.375 G IVPB 30 MIN
3.3750 g | Freq: Once | INTRAVENOUS | Status: AC
  Administered 2022-03-20: 3.375 g via INTRAVENOUS
  Filled 2022-03-20: qty 50

## 2022-03-20 NOTE — ED Provider Notes (Addendum)
[facility]-EMERGENCY DEPT Provider Note   CSN: [id number] Arrival date & time: 03/20/22  1702     History  Chief Complaint  Patient presents with   Fever   post op issue    [name] is a 51 y.o. female with history of HTN, migraines, stress urinary incontinence, s/p anterior cystocele repair, midurethral sling, and cystoscopy presents with 1 day of fever and chills. The patient had an anterior cystocele repair, midurethral sling, and cystoscopy done on 03/06/22 by Dr. Lanetta Inch for anterior vaginal prolapse and stress urinary incontinence. She states that today her fever reached 102F and that she took Tylenol without relief. The patient is also complaining of 1 day of a "pressure" sensation in her lower pelvic region near her urethra. The patient also endorses 1 day of urinary frequency with 3-4 trips to the bathroom today. She also endorses 1 day of headache, nausea, and decreased appetite. The patient has also been experiencing constipation over the last week with her last bowel movement occurring 2 days ago. She called the office of Dr. Florian Buff today who recommended she visit the ED.   The patient denies abdominal pain, vomiting, diarrhea, chest pain, SOB, dysuria, hematuria, or pain around the incision site.   The history is provided by the patient.  Fever Associated symptoms: chills, headaches and nausea   Associated symptoms: no chest pain, no diarrhea, no dysuria and no vomiting        Home Medications Prior to Admission medications   Medication Sig Start Date End Date Taking? Authorizing Provider  acetaminophen (TYLENOL) 500 MG tablet Take 1 tablet (500 mg total) by mouth every 6 (six) hours as needed (pain). 02/21/22   Marguerita Beards, MD  famotidine (PEPCID) 10 MG tablet Take 10 mg by mouth as needed for heartburn or indigestion. Pepcid AC chewable    [provider]  ibuprofen (ADVIL) 600 MG tablet Take 1 tablet (600 mg  total) by mouth every 6 (six) hours as needed. 02/21/22   Marguerita Beards, MD  metoprolol succinate (TOPROL-XL) 50 MG 24 hr tablet Take 1 tablet (50 mg total) by mouth daily. 07/14/21   Willow Ora, MD  Multiple Vitamins-Minerals (AIRBORNE GUMMIES PO) Take by mouth. 3 gummies daily    [provider]  oxyCODONE (OXY IR/ROXICODONE) 5 MG immediate release tablet Take 1 tablet (5 mg total) by mouth every 4 (four) hours as needed for severe pain. 02/21/22   Marguerita Beards, MD  polyethylene glycol powder (GLYCOLAX/MIRALAX) 17 GM/SCOOP powder Take 17 g by mouth daily. Drink 17g (1 scoop) dissolved in water per day. 02/21/22   Marguerita Beards, MD      Allergies    Patient has no known allergies.    Review of Systems   Review of Systems  Constitutional:  Positive for appetite change, chills and fever.  Respiratory:  Negative for shortness of breath.   Cardiovascular:  Negative for chest pain.  Gastrointestinal:  Positive for constipation and nausea. Negative for abdominal pain, diarrhea and vomiting.  Genitourinary:  Positive for frequency and pelvic pain. Negative for dysuria and hematuria.  Neurological:  Positive for headaches.    Physical Exam Updated Vital Signs BP 129/78   Pulse 94   Temp 98.8 F (37.1 C) (Oral)   Resp 20   Ht 5' 2.5" (1.588 m)   Wt 64 kg   LMP 01/28/2022   SpO2 97%   BMI 25.38 kg/m  Physical Exam HENT:  Mouth/Throat:     Mouth: Mucous membranes are dry.  Cardiovascular:     Rate and Rhythm: Normal rate and regular rhythm.     Pulses: Normal pulses.     Heart sounds: No murmur heard.    No friction rub. No gallop.  Pulmonary:     Effort: Pulmonary effort is normal.     Breath sounds: Normal breath sounds. No wheezing, rhonchi or rales.  Abdominal:     General: Bowel sounds are normal. There is no distension.     Palpations: Abdomen is soft.     Tenderness: There is no abdominal tenderness. There is no guarding.   Genitourinary:    Comments: 2 small closed incision sites on the lower pubic region. No redness, warmth, or drainage.  Neurological:     Mental Status: She is alert.     ED Results / Procedures / Treatments   Labs (all labs ordered are listed, but only abnormal results are displayed) Labs Reviewed  CBC - Abnormal; Notable for the following components:      Result Value   WBC 15.2 (*)    RBC 3.74 (*)    Hemoglobin 10.9 (*)    HCT 33.6 (*)    All other components within normal limits  BASIC METABOLIC PANEL - Abnormal; Notable for the following components:   Sodium 134 (*)    Glucose, Bld 122 (*)    Calcium 8.8 (*)    All other components within normal limits  URINALYSIS, ROUTINE W REFLEX MICROSCOPIC    EKG None  Radiology No results found.  Procedures Procedures    Medications Ordered in ED Medications - No data to display  ED Course/ Medical Decision Making/ A&P                           Medical Decision Making [name] is a 51 y.o. female with history of HTN, migraines, stress urinary incontinence, s/p anterior cystocele repair, midurethral sling, and cystoscopy presents with 1 day of fever and chills. Differential diagnosis includes but is not limited to bacteremia, abscess, UTI. Will order blood cultures, lactate, and urinalysis to assess for potential infection. Will order CT abdomen/pelvis to assess for potential structural abnormalities. Will order CBC and CMP to assess cell counts, electrolytes, liver, and kidney function. Patient discussed with Dr. Silverio Lay.   Problems Addressed: Urinary tract infection without hematuria, site unspecified: acute illness or injury  Amount and/or Complexity of Data Reviewed Labs: ordered. Decision-making details documented in ED Course. Radiology: ordered and independent interpretation performed. Decision-making details documented in ED Course.  Risk Prescription drug management.   10:15 PM  Updated patient on results  of CT demonstrating no acute intra-abdominal process, normal lactic acid, and bacteria/leukocytes in her urine. Discussed with patient plan to start Zosyn and consult urology. Patient agrees with the plan.   11:04 PM Patients Zosyn has finished running. Discussed with patient plan to prescribe keflex and follow up with Dr. Oscar La (OBGYN) who will call the patient on 03/21/22. Patient agrees with the plan.         Final Clinical Impression(s) / ED Diagnoses Final diagnoses:  None    Rx / DC Orders ED Discharge Orders     None         Faolan Springfield, Gaylyn Cheers, MD 03/20/22 2257    Karoline Caldwell, MD 03/20/22 2308    Karoline Caldwell, MD 03/20/22 2331    Charlynne Pander, MD 03/20/22 [phone]

## 2022-03-20 NOTE — Telephone Encounter (Signed)
The patient called c/o a fever to 102 and lower abdomina pain.  She is 2 weeks s/p anterior repair and midurethral sling. I have instructed her to go to the ER.

## 2022-03-20 NOTE — ED Triage Notes (Signed)
Patient reports that she began having a headache, fever, and increased abdominal pain. Patient had a vaginal cystocele repair on 03/06/22.

## 2022-03-20 NOTE — Discharge Instructions (Addendum)
We discussed your diagnosis of UTI. Please take your antibiotics as prescribed. Please take tylenol for fever. Please return to the ED if you develop new or worsening symptoms including but not limited to fever, nausea, vomiting, or pain with urination.

## 2022-03-20 NOTE — ED Provider Triage Note (Signed)
Emergency Medicine Provider Triage Evaluation Note  [name] , a 51 y.o. female  was evaluated in triage.  Pt complains of fever, body aches and chills, headache.  Patient had cystocele repair on 03/06/2022.  The patient reports that she has been feeling fine since the procedure.  Patient states that she had her nursing neighbor look at her incision site and it appears clean dry and intact.  Patient denies any abdominal pain, vomiting, diarrhea.  Patient reports mild constipation.  Patient also endorsing slight episode of nausea earlier today.  Patient reports the migraine she is experiencing right now is pretty typical of her standard migraines.  There are no features that are new.  Patient abdomen soft and compressible all 4 quadrants.  Patient lung sounds clear bilaterally.  Review of Systems  Positive:  Negative:   Physical Exam  BP (!) 138/93 (BP Location: Left Arm)   Pulse (!) 106   Temp 99.3 F (37.4 C) (Oral)   Resp 18   Ht 5' 2.5" (1.588 m)   Wt 64 kg   LMP 01/28/2022   SpO2 96%   BMI 25.38 kg/m  Gen:   Awake, no distress   Resp:  Normal effort  MSK:   Moves extremities without difficulty  Other:    Medical Decision Making  Medically screening exam initiated at 5:57 PM.  Appropriate orders placed.  Melana Hingle Stumpo was informed that the remainder of the evaluation will be completed by another provider, this initial triage assessment does not replace that evaluation, and the importance of remaining in the ED until their evaluation is complete.     Al Decant, PA-C 03/20/22 1801

## 2022-03-22 ENCOUNTER — Encounter: Payer: Self-pay | Admitting: Obstetrics and Gynecology

## 2022-03-22 ENCOUNTER — Ambulatory Visit: Payer: 59 | Admitting: Obstetrics and Gynecology

## 2022-03-22 VITALS — BP 132/84 | HR 87 | Wt 144.0 lb

## 2022-03-22 DIAGNOSIS — D72829 Elevated white blood cell count, unspecified: Secondary | ICD-10-CM

## 2022-03-22 DIAGNOSIS — N3090 Cystitis, unspecified without hematuria: Secondary | ICD-10-CM

## 2022-03-22 DIAGNOSIS — G8918 Other acute postprocedural pain: Secondary | ICD-10-CM

## 2022-03-22 LAB — CBC WITH DIFFERENTIAL/PLATELET
Absolute Monocytes: 922 cells/uL (ref 200–950)
Basophils Absolute: 38 cells/uL (ref 0–200)
Basophils Relative: 0.4 %
Eosinophils Absolute: 342 cells/uL (ref 15–500)
Eosinophils Relative: 3.6 %
HCT: 32.9 % — ABNORMAL LOW (ref 35.0–45.0)
Hemoglobin: 10.9 g/dL — ABNORMAL LOW (ref 11.7–15.5)
Lymphs Abs: 2195 cells/uL (ref 850–3900)
MCH: 29.6 pg (ref 27.0–33.0)
MCHC: 33.1 g/dL (ref 32.0–36.0)
MCV: 89.4 fL (ref 80.0–100.0)
MPV: 9.1 fL (ref 7.5–12.5)
Monocytes Relative: 9.7 %
Neutro Abs: 6004 cells/uL (ref 1500–7800)
Neutrophils Relative %: 63.2 %
Platelets: 278 10*3/uL (ref 140–400)
RBC: 3.68 10*6/uL — ABNORMAL LOW (ref 3.80–5.10)
RDW: 14.3 % (ref 11.0–15.0)
Total Lymphocyte: 23.1 %
WBC: 9.5 10*3/uL (ref 3.8–10.8)

## 2022-03-22 NOTE — Progress Notes (Signed)
GYNECOLOGY  VISIT   HPI: 51 y.o.   Divorced White or Caucasian Not Hispanic or Latino  female   [phone] with Patient's last menstrual period was 01/28/2022.   here for  ER follow up. She went to the ER on 03/20/22 c/o fever and malaise. She was diagnosed with a UTI (afebrile in the ER, WBC of 15, negative CT).  She was treated with zosyn in the ER and D/C'd home on keflex. She is feeling better. Still having some vaginal pain in certain positions, but improved. No further fevers, aching is better. Feels she is voiding normally, not leaking.   GYNECOLOGIC HISTORY: Patient's last menstrual period was 01/28/2022. Contraception:vasectomy  Menopausal hormone therapy: none         OB History     Gravida  3   Para  2   Term      Preterm      AB  1   Living  2      SAB  1   IAB      Ectopic  0   Multiple      Live Births  2              Patient Active Problem List   Diagnosis Date Noted   Primary stress urinary incontinence 07/14/2021   History of melanoma 07/14/2021   Gastroesophageal reflux disease 06/18/2019   Carpal tunnel syndrome, bilateral 06/18/2019   Essential hypertension 05/08/2019   Family history of premature CAD 11/20/2018   Allergic rhinitis 12/17/2012   Anxiety 06/27/2012    Past Medical History:  Diagnosis Date   Anemia    as a child and during pregnancy   Anxiety    CIN I (cervical intraepithelial neoplasia I)    1999-LEEP, normal Paps after    Depression    Essential hypertension 05/08/2019   Patient follows with Dr. Asencion Partridge, PCP. Theron Arista 07/14/21 as of 02/28/22.   Family history of premature CAD 11/20/2018   Mom   GERD (gastroesophageal reflux disease)    H/O gestational diabetes mellitus, not currently pregnant    GESTATIONAL     Headache    hx of migraines, migraine w/parathesia on 04/04/2019, MRI normal   History of melanoma 07/14/2021   Face, 20s. Sees Dr. Terri Piedra annually   Prolapse of anterior vaginal wall 2023   SUI (stress  urinary incontinence, female) 2023   Tinnitus aurium, bilateral    since 2018, off and on   Wears glasses    reading only    Past Surgical History:  Procedure Laterality Date   AUGMENTATION MAMMAPLASTY  2005   BLADDER SUSPENSION N/A 03/06/2022   Procedure: TRANSVAGINAL TAPE (TVT) PROCEDURE;  Surgeon: Marguerita Beards, MD;  Location: [facility];  Service: Gynecology;  Laterality: N/A;   BUNIONECTOMY Left 1997   CERVICAL BIOPSY  W/ LOOP ELECTRODE EXCISION  1999   CIN I   COLONOSCOPY  11/26/2020   normal   CYSTOCELE REPAIR N/A 03/06/2022   Procedure: ANTERIOR REPAIR (CYSTOCELE);  Surgeon: Marguerita Beards, MD;  Location: [facility];  Service: Gynecology;  Laterality: N/A;   CYSTOSCOPY N/A 03/06/2022   Procedure: CYSTOSCOPY;  Surgeon: Marguerita Beards, MD;  Location: [facility];  Service: Gynecology;  Laterality: N/A;   ear drum surgery     at age 23    Current Outpatient Medications  Medication Sig Dispense Refill   acetaminophen (TYLENOL) 500 MG tablet Take 1 tablet (500 mg total) by  mouth every 6 (six) hours as needed (pain). 30 tablet 0   cephALEXin (KEFLEX) 500 MG capsule Take 1 capsule (500 mg total) by mouth 3 (three) times daily for 7 days. 21 capsule 0   famotidine (PEPCID) 10 MG tablet Take 10 mg by mouth as needed for heartburn or indigestion. Pepcid AC chewable     ibuprofen (ADVIL) 600 MG tablet Take 1 tablet (600 mg total) by mouth every 6 (six) hours as needed. 30 tablet 0   metoprolol succinate (TOPROL-XL) 50 MG 24 hr tablet Take 1 tablet (50 mg total) by mouth daily. 90 tablet 3   Multiple Vitamins-Minerals (AIRBORNE GUMMIES PO) Take by mouth. 3 gummies daily     oxyCODONE (OXY IR/ROXICODONE) 5 MG immediate release tablet Take 1 tablet (5 mg total) by mouth every 4 (four) hours as needed for severe pain. 5 tablet 0   polyethylene glycol powder (GLYCOLAX/MIRALAX) 17 GM/SCOOP powder Take 17 g by mouth daily. Drink  17g (1 scoop) dissolved in water per day. 255 g 0   No current facility-administered medications for this visit.     ALLERGIES: Patient has no known allergies.  Family History  Problem Relation Age of Onset   Diabetes Mother    Heart disease Mother    Hypertension Mother    Hyperlipidemia Mother    Lung cancer Mother    Cancer Father        RENAL CELL   Renal cancer Father    Heart disease Maternal Grandmother    Heart disease Paternal Grandmother    Hypertension Paternal Grandmother    Heart disease Paternal Grandfather    Prostate cancer Paternal Grandfather    Healthy Daughter    Epilepsy Son    Learning disabilities Maternal Aunt    Diabetes Brother    Cerebral aneurysm Maternal Grandfather    Colon cancer Neg Hx    Esophageal cancer Neg Hx    Stomach cancer Neg Hx    Rectal cancer Neg Hx     Social History   Socioeconomic History   Marital status: Divorced    Spouse name: has a boyfriend   Number of children: 2   Years of education: Not on file   Highest education level: Not on file  Occupational History   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   Has 2 children and boyfriend has 3 children   Social Determinants of Radio broadcast assistant Strain: Not on file  Food Insecurity: Not on file  Transportation Needs: Not on file  Physical Activity: Not on file  Stress: Not on file  Social Connections: Not on file  Intimate Partner Violence: Not on file    Review of Systems  All other systems reviewed and are negative.   PHYSICAL EXAMINATION:    LMP 01/28/2022     General appearance: alert, cooperative  and appears stated age CVA: not tender Abdomen: soft, non-tender; non distended, no masses,  no organomegaly  Pelvic: External genitalia:  no lesions  Urethra:  normal appearing urethra with no masses, tenderness or lesions              Bartholins and Skenes: normal                 Vagina: normal appearing vagina with normal color and discharge, vaginal incision is intact, not erythematous, not tender, no abnormal d/c.               Cervix: no lesions              Bimanual Exam:  Uterus:  normal size, contour, position, consistency, mobility, non-tender              Adnexa: no mass, fullness, tenderness               Chaperone was present for exam.  1. Cystitis On antibiotics, feeling better  2. Leukocytosis, unspecified type WBC was 15 on 03/20/22. Will repeat it today  3. Post-op pain Mild and improving. Surgical site intact and not tender.  F/U with Dr Florian Buff as planned Call with any concerns

## 2022-03-24 NOTE — Telephone Encounter (Signed)
LMOVM for pt to call office if having any problems

## 2022-03-25 LAB — CULTURE, BLOOD (ROUTINE X 2)
Culture: NO GROWTH
Culture: NO GROWTH
Special Requests: ADEQUATE
Special Requests: ADEQUATE

## 2022-03-31 ENCOUNTER — Encounter: Payer: Self-pay | Admitting: Obstetrics and Gynecology

## 2022-03-31 ENCOUNTER — Ambulatory Visit (INDEPENDENT_AMBULATORY_CARE_PROVIDER_SITE_OTHER): Payer: 59 | Admitting: Obstetrics and Gynecology

## 2022-03-31 VITALS — BP 131/85 | HR 75

## 2022-03-31 DIAGNOSIS — Z9889 Other specified postprocedural states: Secondary | ICD-10-CM

## 2022-03-31 NOTE — Progress Notes (Signed)
[facility]  Date of Visit: 03/31/2022  History of Present Illness: [name] is a 51 y.o. female scheduled today for a post-operative visit.   Surgery: s/p Anterior repair, midurethral sling (KIM sling), cystoscopy on 03/06/22  Was diagosed with UTI in ER visit on 03/20/22 and was prescribed antibiotics. WBC was 15 at the time so CT was performed and was negative for infection. She has not had any issues since. Urine culture was not performed. She feels that she is emptying her bladder well. Has been drinking more water. Small amount of vaginal bleeding present the last few days. Has some fatigue and pelvic pressure, but not having pain. Has been careful about lifting/ bending. She is not having any urine leakage.  CT abdomen/ plevis IMPRESSION: 1. No acute intra-abdominal process or evidence of abscess. 2. Uterine fibroid. 3. Hepatic cysts. 4. Punctate renal calculi bilaterally. 5. Small hiatal hernia.  Medications: She has a current medication list which includes the following prescription(s): acetaminophen, famotidine, ibuprofen, metoprolol succinate, and multiple vitamins-minerals.   Allergies: Patient has No Known Allergies.   Physical Exam: BP 131/85   Pulse 75   LMP 01/28/2022    Pelvic Examination: Vagina: Incisions healing well. Sutures are not present. No tenderness along the anterior or posterior vagina. No apical tenderness. No pelvic masses. No visible or palpable mesh.  POP-Q: deferred  ---------------------------------------------------------  Assessment and Plan:  1. Post-operative state     - Healing well - No acute post-op concerns - Will follow up for 6 week post op visit.   Veronica Beards, MD

## 2022-04-07 ENCOUNTER — Encounter: Payer: 59 | Admitting: Obstetrics and Gynecology

## 2022-04-18 ENCOUNTER — Ambulatory Visit (INDEPENDENT_AMBULATORY_CARE_PROVIDER_SITE_OTHER): Payer: 59 | Admitting: Obstetrics and Gynecology

## 2022-04-18 ENCOUNTER — Encounter: Payer: Self-pay | Admitting: Obstetrics and Gynecology

## 2022-04-18 VITALS — BP 134/88 | HR 79

## 2022-04-18 DIAGNOSIS — Z9889 Other specified postprocedural states: Secondary | ICD-10-CM

## 2022-04-18 NOTE — Progress Notes (Signed)
[facility]  Date of Visit: 04/18/2022  History of Present Illness: Ms. [name] is a 51 y.o. female scheduled today for a post-operative visit.   Surgery: s/p Anterior repair, midurethral sling (KIM sling), cystoscopy on 03/06/22  She passed her postoperative void trial.   Postoperative course was complicated by a urinary tract infection.   Today she reports she is feeling well overall.   Pain?  Has occasional pain in the vaginal area but not needing pain medication. She did have her period last week.  She has returned to her normal activity (except for postop restrictions) Vaginal bulge? No  Stress incontinence: No - had one episode after the surgery but nothing since then.  Urgency/frequency: No  Urge incontinence: No  Voiding dysfunction: No  Bowel issues: No   Subjective Success: Do you usually have a bulge or something falling out that you can see or feel in the vaginal area? No  Retreatment Success: Any retreatment with surgery or pessary for any compartment? No    Medications: She has a current medication list which includes the following prescription(s): acetaminophen, famotidine, ibuprofen, metoprolol succinate, multiple vitamins-minerals, and [facility] purelax.   Allergies: Patient has No Known Allergies.   Physical Exam: BP 134/88   Pulse 79   LMP 04/12/2022    Pelvic Examination: Vagina: Incisions healing well. Sutures are not present at incision line and there is not granulation tissue. No tenderness along the anterior or posterior vagina. No apical tenderness. No pelvic masses. No visible or palpable mesh.  POP-Q: POP-Q  -3                                            Aa   -3                                           Ba  -7                                              C   4                                            Gh  3                                            Pb  10                                            tvl   -2                                             Ap  -2  Bp  -9.5                                              D     ---------------------------------------------------------  Assessment and Plan:  1. Post-operative state    - Well healed.  - Can resume regular activity including exercise and intercourse,  if desired.  - Discussed avoidance of heavy lifting and straining long term to reduce the risk of recurrence.  - Needs annual GYN exams- will return to her gynecologist for care.   Return as needed  Marguerita Beards, MD

## 2022-04-19 ENCOUNTER — Other Ambulatory Visit: Payer: Self-pay | Admitting: Obstetrics and Gynecology

## 2022-04-19 ENCOUNTER — Encounter: Payer: Self-pay | Admitting: Obstetrics and Gynecology

## 2022-04-19 DIAGNOSIS — N8110 Cystocele, unspecified: Secondary | ICD-10-CM

## 2022-04-19 DIAGNOSIS — Z01818 Encounter for other preprocedural examination: Secondary | ICD-10-CM

## 2022-05-29 ENCOUNTER — Encounter: Payer: Self-pay | Admitting: Family Medicine

## 2022-05-29 ENCOUNTER — Ambulatory Visit (INDEPENDENT_AMBULATORY_CARE_PROVIDER_SITE_OTHER): Payer: 59 | Admitting: Family Medicine

## 2022-05-29 VITALS — BP 152/90 | HR 79 | Temp 98.0°F | Ht 62.5 in | Wt 148.2 lb

## 2022-05-29 DIAGNOSIS — K219 Gastro-esophageal reflux disease without esophagitis: Secondary | ICD-10-CM

## 2022-05-29 DIAGNOSIS — F4322 Adjustment disorder with anxiety: Secondary | ICD-10-CM | POA: Diagnosis not present

## 2022-05-29 DIAGNOSIS — Z0000 Encounter for general adult medical examination without abnormal findings: Secondary | ICD-10-CM

## 2022-05-29 DIAGNOSIS — I10 Essential (primary) hypertension: Secondary | ICD-10-CM | POA: Diagnosis not present

## 2022-05-29 DIAGNOSIS — Z8249 Family history of ischemic heart disease and other diseases of the circulatory system: Secondary | ICD-10-CM | POA: Diagnosis not present

## 2022-05-29 MED ORDER — METOPROLOL SUCCINATE ER 100 MG PO TB24: 100.0000 mg | ORAL_TABLET | Freq: Every day | ORAL | 3 refills | Status: DC

## 2022-05-29 MED ORDER — ALPRAZOLAM 0.5 MG PO TABS: 0.5000 mg | ORAL_TABLET | Freq: Every day | ORAL | 0 refills | Status: DC | PRN

## 2022-05-29 NOTE — Progress Notes (Signed)
Subjective  Chief Complaint  Patient presents with   Hypertension    Pt her for F/U with Bp    HPI: [name] is a 51 y.o. female who presents to [facility] today for a Female Wellness Visit. She also has the concerns and/or needs as listed above in the chief complaint. These will be addressed in addition to the Health Maintenance Visit.   Wellness Visit: annual visit with health maintenance review and exam without Pap  Health maintenance: Sees GYN annually.  Doing well.  Pap smear and colorectal cancer screenings are current.  Mammogram current.  Had bladder sling surgery several months ago and is recovering from that.  Now ready to get exercising again.  Working on weight loss.  Eligible for Shingrix and flu shot.  Declines flu shot.  Will return for Shingrix. Chronic disease f/u and/or acute problem visit: (deemed necessary to be done in addition to the wellness visit): Anxiety: Overall well controlled however stressful home situation.  Her son and daughter-in-law who is expecting are moving in with her.  Affecting sleep some.  No panic symptoms.  No depressive symptoms.  But has used Xanax intermittently in the past and requesting refill. Hypertension: Has been well controlled.  Not currently checking at home.  No chest pain, shortness of breath, headaches or lower extremity edema.  Taking metoprolol XL 50 mg daily.  No adverse effects.  Weight is a little bit up. GERD: Continues to be well controlled on Pepcid.  Assessment  1. Annual physical exam   2. Essential hypertension   3. Adjustment reaction with anxiety   4. Family history of premature CAD   5. Gastroesophageal reflux disease, unspecified whether esophagitis present      Plan  Female Wellness Visit: Age appropriate Health Maintenance and Prevention measures were discussed with patient. Included topics are cancer screening recommendations, ways to keep healthy (see AVS) including dietary  and exercise recommendations, regular eye and dental care, use of seat belts, and avoidance of moderate alcohol use and tobacco use.  Screens are current BMI: discussed patient's BMI and encouraged positive lifestyle modifications to help get to or maintain a target BMI. HM needs and immunizations were addressed and ordered. See below for orders. See HM and immunization section for updates.  Declines flu shot.  Will schedule nurse visit for Shingrix. Routine labs and screening tests ordered including cmp, cbc and lipids where appropriate. Discussed recommendations regarding Vit D and calcium supplementation (see AVS)  Chronic disease management visit and/or acute problem visit: Hypertension: Elevated reading today.  We will increase Toprol-XL to 100 mg daily.  Patient will start rechecking at home.  She will follow-up with me if not becoming under control.  See after visit summary for goals.  Start low-fat diet, low-salt diet and weight loss.  Recommend exercise.  Stress management will also help.  Check renal function electrolytes and lipids. Adjustment anxiety: No chronic concerns, intermittent symptoms.  Will use as needed Xanax.  Has used successfully in the past.  Educated on risk versus benefits. GERD: Continue Pepcid.  Follow up: Return in about 6 months (around 11/27/2022) for follow up Hypertension.  Orders Placed This Encounter  Procedures   CBC with Differential/Platelet   Comprehensive metabolic panel   Lipid panel   Meds ordered this encounter  Medications   metoprolol succinate (TOPROL-XL) 100 MG 24 hr tablet    Sig: Take 1 tablet (100 mg total) by mouth daily.  Dispense:  90 tablet    Refill:  3   ALPRAZolam (XANAX) 0.5 MG tablet    Sig: Take 1 tablet (0.5 mg total) by mouth daily as needed for anxiety.    Dispense:  30 tablet    Refill:  0      Body mass index is 26.67 kg/m. Wt Readings from Last 3 Encounters:  05/29/22 148 lb 3.2 oz (67.2 kg)  03/22/22 144 lb  (65.3 kg)  03/20/22 141 lb (64 kg)     Patient Active Problem List   Diagnosis Date Noted   History of melanoma 07/14/2021    Priority: High    Face, 20s. Sees Dr. Terri Piedra annually    Essential hypertension 05/08/2019    Priority: High   Family history of premature CAD 11/20/2018    Priority: High    Mom    Anxiety 06/27/2012    Priority: High    Rare xanax use    Gastroesophageal reflux disease 06/18/2019    Priority: Medium     GI 06/2019    Carpal tunnel syndrome, bilateral 06/18/2019    Priority: Medium    Primary stress urinary incontinence 07/14/2021    Priority: Low   Allergic rhinitis 12/17/2012    Priority: Low   Health Maintenance  Topic Date Due   Zoster Vaccines- Shingrix (1 of 2) 08/28/2022 (Originally 09/16/1990)   INFLUENZA VACCINE  12/17/2022 (Originally 04/18/2022)   MAMMOGRAM  09/22/2022   PAP SMEAR-Modifier  10/09/2023   TETANUS/TDAP  09/05/2027   COLONOSCOPY (Pts 45-41yrs Insurance coverage will need to be confirmed)  11/27/2030   Hepatitis C Screening  Completed   HIV Screening  Completed   HPV VACCINES  Aged Out   COVID-19 Vaccine  Discontinued   Immunization History  Administered Date(s) Administered   Influenza,trivalent, recombinat, inj, PF 06/19/2011, 06/10/2013   Tdap 09/04/2017   We updated and reviewed the patient's past history in detail and it is documented below. Allergies: Patient has No Known Allergies. Past Medical History Patient  has a past medical history of Anemia, Anxiety, CIN I (cervical intraepithelial neoplasia I), Depression, Essential hypertension (05/08/2019), Family history of premature CAD (11/20/2018), GERD (gastroesophageal reflux disease), H/O gestational diabetes mellitus, not currently pregnant, Headache, History of melanoma (07/14/2021), Prolapse of anterior vaginal wall (2023), SUI (stress urinary incontinence, female) (2023), Tinnitus aurium, bilateral, and Wears glasses. Past Surgical History Patient  has a  past surgical history that includes Cervical biopsy w/ loop electrode excision (1999); Augmentation mammaplasty (2005); Bunionectomy (Left, 1997); ear drum surgery; Colonoscopy (11/26/2020); Cystocele repair (N/A, 03/06/2022); Bladder suspension (N/A, 03/06/2022); and Cystoscopy (N/A, 03/06/2022). Family History: Patient family history includes Cancer in her father; Cerebral aneurysm in her maternal grandfather; Diabetes in her brother and mother; Epilepsy in her son; Healthy in her daughter; Heart disease in her maternal grandmother, mother, paternal grandfather, and paternal grandmother; Hyperlipidemia in her mother; Hypertension in her mother and paternal grandmother; Learning disabilities in her maternal aunt; Lung cancer in her mother; Prostate cancer in her paternal grandfather; Renal cancer in her father. Social History:  Patient  reports that she has never smoked. She has never used smokeless tobacco. She reports current alcohol use. She reports that she does not use drugs.  Review of Systems: Constitutional: negative for fever or malaise Ophthalmic: negative for photophobia, double vision or loss of vision Cardiovascular: negative for chest pain, dyspnea on exertion, or new LE swelling Respiratory: negative for SOB or persistent cough Gastrointestinal: negative for abdominal pain, change in bowel  habits or melena Genitourinary: negative for dysuria or gross hematuria, no abnormal uterine bleeding or disharge Musculoskeletal: negative for new gait disturbance or muscular weakness Integumentary: negative for new or persistent rashes, no breast lumps Neurological: negative for TIA or stroke symptoms Psychiatric: negative for SI or delusions Allergic/Immunologic: negative for hives  Patient Care Team    Relationship Specialty Notifications Start End  Willow Ora, MD PCP - General Family Medicine  11/20/18   Harrington Challenger, NP (Inactive) Nurse Practitioner Obstetrics and Gynecology  11/20/18    Mammography, [facility]  Diagnostic Radiology  11/20/18   Shellia Cleverly, DO Consulting Physician Gastroenterology  07/08/19   Cherlyn Roberts, MD Referring Physician Dermatology  07/14/21   Marguerita Beards, MD Consulting Physician Gynecology  07/14/21    Comment: Urogynecology    Objective  Vitals: BP (!) 152/90   Pulse 79   Temp 98 F (36.7 C)   Ht 5' 2.5" (1.588 m)   Wt 148 lb 3.2 oz (67.2 kg)   SpO2 98%   BMI 26.67 kg/m  General:  Well developed, well nourished, no acute distress  Psych:  Alert and orientedx3,normal mood and affect HEENT:  Normocephalic, atraumatic, non-icteric sclera,  supple neck without adenopathy, mass or thyromegaly Cardiovascular:  Normal S1, S2, RRR without gallop, rub or murmur Respiratory:  Good breath sounds bilaterally, CTAB with normal respiratory effort Gastrointestinal: normal bowel sounds, soft, non-tender, no noted masses. No HSM MSK: no deformities, contusions. Joints are without erythema or swelling.  Skin:  Warm, no rashes or suspicious lesions noted Neurologic:    Mental status is normal. CN 2-11 are normal. Gross motor and sensory exams are normal. Normal gait. No tremor   Commons side effects, risks, benefits, and alternatives for medications and treatment plan prescribed today were discussed, and the patient expressed understanding of the given instructions. Patient is instructed to call or message via MyChart if he/she has any questions or concerns regarding our treatment plan. No barriers to understanding were identified. We discussed Red Flag symptoms and signs in detail. Patient expressed understanding regarding what to do in case of urgent or emergency type symptoms.  Medication list was reconciled, printed and provided to the patient in AVS. Patient instructions and summary information was reviewed with the patient as documented in the AVS. This note was prepared with assistance of Dragon voice recognition software. Occasional  wrong-word or sound-a-like substitutions may have occurred due to the inherent limitations of voice recognition software  This visit occurred during the SARS-CoV-2 public health emergency.  Safety protocols were in place, including screening questions prior to the visit, additional usage of staff PPE, and extensive cleaning of exam room while observing appropriate contact time as indicated for disinfecting solutions.

## 2022-05-29 NOTE — Patient Instructions (Signed)
Please return in 6 months for hypertension follow up.  Sooner if home blood pressures are > 135/85 on the higher dose of the blood pressure pill. I've increased your pill to 100mg  daily.   I will release your lab results to you on your MyChart account with further instructions. You may see the results before I do, but when I review them I will send you a message with my report or have my assistant call you if things need to be discussed. Please reply to my message with any questions. Thank you!   If you have any questions or concerns, please don't hesitate to send me a message via MyChart or call the office at [phone]. Thank you for visiting with [phone] today! It's our pleasure caring for you.   Hypertension, Adult High blood pressure (hypertension) is when the force of blood pumping through the arteries is too strong. The arteries are the blood vessels that carry blood from the heart throughout the body. Hypertension forces the heart to work harder to pump blood and may cause arteries to become narrow or stiff. Untreated or uncontrolled hypertension can lead to a heart attack, heart failure, a stroke, kidney disease, and other problems. A blood pressure reading consists of a higher number over a lower number. Ideally, your blood pressure should be below 120/80. The first ("top") number is called the systolic pressure. It is a measure of the pressure in your arteries as your heart beats. The second ("bottom") number is called the diastolic pressure. It is a measure of the pressure in your arteries as the heart relaxes. What are the causes? The exact cause of this condition is not known. There are some conditions that result in high blood pressure. What increases the risk? Certain factors may make you more likely to develop high blood pressure. Some of these risk factors are under your control, including: Smoking. Not getting enough exercise or physical activity. Being overweight. Having too much  fat, sugar, calories, or salt (sodium) in your diet. Drinking too much alcohol. Other risk factors include: Having a personal history of heart disease, diabetes, high cholesterol, or kidney disease. Stress. Having a family history of high blood pressure and high cholesterol. Having obstructive sleep apnea. Age. The risk increases with age. What are the signs or symptoms? High blood pressure may not cause symptoms. Very high blood pressure (hypertensive crisis) may cause: Headache. Fast or irregular heartbeats (palpitations). Shortness of breath. Nosebleed. Nausea and vomiting. Vision changes. Severe chest pain, dizziness, and seizures. How is this diagnosed? This condition is diagnosed by measuring your blood pressure while you are seated, with your arm resting on a flat surface, your legs uncrossed, and your feet flat on the floor. The cuff of the blood pressure monitor will be placed directly against the skin of your upper arm at the level of your heart. Blood pressure should be measured at least twice using the same arm. Certain conditions can cause a difference in blood pressure between your right and left arms. If you have a high blood pressure reading during one visit or you have normal blood pressure with other risk factors, you may be asked to: Return on a different day to have your blood pressure checked again. Monitor your blood pressure at home for 1 week or longer. If you are diagnosed with hypertension, you may have other blood or imaging tests to help your health care provider understand your overall risk for other conditions. How is this treated? This condition is  treated by making healthy lifestyle changes, such as eating healthy foods, exercising more, and reducing your alcohol intake. You may be referred for counseling on a healthy diet and physical activity. Your health care provider may prescribe medicine if lifestyle changes are not enough to get your blood pressure  under control and if: Your systolic blood pressure is above 130. Your diastolic blood pressure is above 80. Your personal target blood pressure may vary depending on your medical conditions, your age, and other factors. Follow these instructions at home: Eating and drinking  Eat a diet that is high in fiber and potassium, and low in sodium, added sugar, and fat. An example of this eating plan is called the DASH diet. DASH stands for Dietary Approaches to Stop Hypertension. To eat this way: Eat plenty of fresh fruits and vegetables. Try to fill one half of your plate at each meal with fruits and vegetables. Eat whole grains, such as whole-wheat pasta, brown rice, or whole-grain bread. Fill about one fourth of your plate with whole grains. Eat or drink low-fat dairy products, such as skim milk or low-fat yogurt. Avoid fatty cuts of meat, processed or cured meats, and poultry with skin. Fill about one fourth of your plate with lean proteins, such as fish, chicken without skin, beans, eggs, or tofu. Avoid pre-made and processed foods. These tend to be higher in sodium, added sugar, and fat. Reduce your daily sodium intake. Many people with hypertension should eat less than 1,500 mg of sodium a day. Do not drink alcohol if: Your health care provider tells you not to drink. You are pregnant, may be pregnant, or are planning to become pregnant. If you drink alcohol: Limit how much you have to: 0-1 drink a day for women. 0-2 drinks a day for men. Know how much alcohol is in your drink. In the [place] one drink equals one 12 oz bottle of beer (355 mL), one 5 oz glass of wine (148 mL), or one 1 oz glass of hard liquor (44 mL). Lifestyle  Work with your health care provider to maintain a healthy body weight or to lose weight. Ask what an ideal weight is for you. Get at least 30 minutes of exercise that causes your heart to beat faster (aerobic exercise) most days of the week. Activities may include  walking, swimming, or biking. Include exercise to strengthen your muscles (resistance exercise), such as Pilates or lifting weights, as part of your weekly exercise routine. Try to do these types of exercises for 30 minutes at least 3 days a week. Do not use any products that contain nicotine or tobacco. These products include cigarettes, chewing tobacco, and vaping devices, such as e-cigarettes. If you need help quitting, ask your health care provider. Monitor your blood pressure at home as told by your health care provider. Keep all follow-up visits. This is important. Medicines Take over-the-counter and prescription medicines only as told by your health care provider. Follow directions carefully. Blood pressure medicines must be taken as prescribed. Do not skip doses of blood pressure medicine. Doing this puts you at risk for problems and can make the medicine less effective. Ask your health care provider about side effects or reactions to medicines that you should watch for. Contact a health care provider if you: Think you are having a reaction to a medicine you are taking. Have headaches that keep coming back (recurring). Feel dizzy. Have swelling in your ankles. Have trouble with your vision. Get help right away if  you: Develop a severe headache or confusion. Have unusual weakness or numbness. Feel faint. Have severe pain in your chest or abdomen. Vomit repeatedly. Have trouble breathing. These symptoms may be an emergency. Get help right away. Call 911. Do not wait to see if the symptoms will go away. Do not drive yourself to the hospital. Summary Hypertension is when the force of blood pumping through your arteries is too strong. If this condition is not controlled, it may put you at risk for serious complications. Your personal target blood pressure may vary depending on your medical conditions, your age, and other factors. For most people, a normal blood pressure is less than  120/80. Hypertension is treated with lifestyle changes, medicines, or a combination of both. Lifestyle changes include losing weight, eating a healthy, low-sodium diet, exercising more, and limiting alcohol. This information is not intended to replace advice given to you by your health care provider. Make sure you discuss any questions you have with your health care provider. Document Revised: 07/12/2021 Document Reviewed: 07/12/2021 Elsevier Patient Education  2023 [employer].

## 2022-05-30 LAB — CBC WITH DIFFERENTIAL/PLATELET
Basophils Absolute: 0.1 10*3/uL (ref 0.0–0.1)
Basophils Relative: 0.9 % (ref 0.0–3.0)
Eosinophils Absolute: 0.5 10*3/uL (ref 0.0–0.7)
Eosinophils Relative: 7.4 % — ABNORMAL HIGH (ref 0.0–5.0)
HCT: 34.2 % — ABNORMAL LOW (ref 36.0–46.0)
Hemoglobin: 11.5 g/dL — ABNORMAL LOW (ref 12.0–15.0)
Lymphocytes Relative: 37.8 % (ref 12.0–46.0)
Lymphs Abs: 2.3 10*3/uL (ref 0.7–4.0)
MCHC: 33.5 g/dL (ref 30.0–36.0)
MCV: 86.8 fl (ref 78.0–100.0)
Monocytes Absolute: 0.7 10*3/uL (ref 0.1–1.0)
Monocytes Relative: 11.1 % (ref 3.0–12.0)
Neutro Abs: 2.6 10*3/uL (ref 1.4–7.7)
Neutrophils Relative %: 42.8 % — ABNORMAL LOW (ref 43.0–77.0)
Platelets: 295 10*3/uL (ref 150.0–400.0)
RBC: 3.94 Mil/uL (ref 3.87–5.11)
RDW: 14.9 % (ref 11.5–15.5)
WBC: 6.1 10*3/uL (ref 4.0–10.5)

## 2022-05-30 LAB — COMPREHENSIVE METABOLIC PANEL
ALT: 21 U/L (ref 0–35)
AST: 22 U/L (ref 0–37)
Albumin: 4.3 g/dL (ref 3.5–5.2)
Alkaline Phosphatase: 51 U/L (ref 39–117)
BUN: 19 mg/dL (ref 6–23)
CO2: 25 mEq/L (ref 19–32)
Calcium: 9.8 mg/dL (ref 8.4–10.5)
Chloride: 101 mEq/L (ref 96–112)
Creatinine, Ser: 0.82 mg/dL (ref 0.40–1.20)
GFR: 82.08 mL/min (ref >60.00)
Glucose, Bld: 82 mg/dL (ref 70–99)
Potassium: 4.4 mEq/L (ref 3.5–5.1)
Sodium: 135 mEq/L (ref 135–145)
Total Bilirubin: 0.7 mg/dL (ref 0.2–1.2)
Total Protein: 7.3 g/dL (ref 6.0–8.3)

## 2022-05-30 LAB — LIPID PANEL
Cholesterol: 154 mg/dL (ref 0–200)
HDL: 48.4 mg/dL (ref >39.00)
LDL Cholesterol: 88 mg/dL (ref 0–99)
NonHDL: 105.26
Total CHOL/HDL Ratio: 3
Triglycerides: 87 mg/dL (ref 0.0–149.0)
VLDL: 17.4 mg/dL (ref 0.0–40.0)

## 2022-07-16 ENCOUNTER — Other Ambulatory Visit: Payer: Self-pay | Admitting: Family Medicine

## 2022-10-03 DIAGNOSIS — Z1231 Encounter for screening mammogram for malignant neoplasm of breast: Secondary | ICD-10-CM | POA: Diagnosis not present

## 2022-10-04 ENCOUNTER — Encounter: Payer: Self-pay | Admitting: Nurse Practitioner

## 2022-11-07 DIAGNOSIS — D225 Melanocytic nevi of trunk: Secondary | ICD-10-CM | POA: Diagnosis not present

## 2022-11-07 DIAGNOSIS — L814 Other melanin hyperpigmentation: Secondary | ICD-10-CM | POA: Diagnosis not present

## 2022-11-07 DIAGNOSIS — L821 Other seborrheic keratosis: Secondary | ICD-10-CM | POA: Diagnosis not present

## 2022-11-07 DIAGNOSIS — Z08 Encounter for follow-up examination after completed treatment for malignant neoplasm: Secondary | ICD-10-CM | POA: Diagnosis not present

## 2022-11-07 DIAGNOSIS — L570 Actinic keratosis: Secondary | ICD-10-CM | POA: Diagnosis not present

## 2022-11-07 DIAGNOSIS — Z85828 Personal history of other malignant neoplasm of skin: Secondary | ICD-10-CM | POA: Diagnosis not present

## 2022-11-07 DIAGNOSIS — Z7189 Other specified counseling: Secondary | ICD-10-CM | POA: Diagnosis not present

## 2022-11-07 DIAGNOSIS — Z85820 Personal history of malignant melanoma of skin: Secondary | ICD-10-CM | POA: Diagnosis not present

## 2022-11-29 ENCOUNTER — Ambulatory Visit: Payer: 59 | Admitting: Family Medicine

## 2022-11-29 VITALS — BP 128/72 | HR 79 | Temp 98.0°F | Ht 62.5 in | Wt 152.2 lb

## 2022-11-29 DIAGNOSIS — I10 Essential (primary) hypertension: Secondary | ICD-10-CM | POA: Diagnosis not present

## 2022-11-29 DIAGNOSIS — Z8249 Family history of ischemic heart disease and other diseases of the circulatory system: Secondary | ICD-10-CM

## 2022-11-29 DIAGNOSIS — F419 Anxiety disorder, unspecified: Secondary | ICD-10-CM

## 2022-11-29 MED ORDER — HYDROCHLOROTHIAZIDE 12.5 MG PO CAPS
12.5000 mg | ORAL_CAPSULE | Freq: Every day | ORAL | 3 refills | Status: DC
Start: 2022-11-29 — End: 2023-11-16

## 2022-11-29 NOTE — Patient Instructions (Signed)
Please return in 6 months for your annual complete physical; please come fasting.   Avoid ibuprofen if possible as it can raise the blood pressure. Add the low dose fluid pill to your metoprolol to get your blood pressure perfectly controlled. Let me know if you have any problems with it.   If you have any questions or concerns, please don't hesitate to send me a message via MyChart or call the office at [phone]. Thank you for visiting with [place] today! It's our pleasure caring for you.

## 2022-11-29 NOTE — Progress Notes (Signed)
Subjective  CC:  Chief Complaint  Patient presents with   Hypertension    -    HPI: [name] is a 52 y.o. female who presents to the office today to address the problems listed above in the chief complaint. Hypertension f/u: Control is good  by home readings: chart with intermittent readings since September avg 120s/70s with some lower and some higher. Reports elevated after exercise. Has gained weight; not trying to lose it. Pt reports she is doing well. taking medications as instructed, no medication side effects noted, no TIAs, no chest pain on exertion, no dyspnea on exertion, no swelling of ankles. She denies adverse effects from his BP medications. Compliance with medication is good. On metoprolol xl 100 daily. Anxiety: doing well. No problems. No longer needing xanax. Home life is busy: new grandmother and son and his family live with her. She is happy. Fam h/x of CAD: pt w/o chest pain.  Assessment  1. Essential hypertension   2. Family history of premature CAD   3. Anxiety      Plan   Hypertension f/u: BP control is fairly well controlled. Elevated in office likely with white coat response: will add microzide 12.'5mg'$  daily to toprol xl 100. Continue home monitoring.  Want bp to be well controlled at 120/70 or less. Recommend exercise for cardiac health. And weight loss Monitor mood. Currently stable.   Education regarding management of these chronic disease states was given. Management strategies discussed on successive visits include dietary and exercise recommendations, goals of achieving and maintaining IBW, and lifestyle modifications aiming for adequate sleep and minimizing stressors.   Follow up: 6 mo for cpe  No orders of the defined types were placed in this encounter.  Meds ordered this encounter  Medications   hydrochlorothiazide (MICROZIDE) 12.5 MG capsule    Sig: Take 1 capsule (12.5 mg total) by mouth daily.    Dispense:  90 capsule    Refill:  3       BP Readings from Last 3 Encounters:  11/29/22 128/72  05/29/22 (!) 152/90  04/18/22 134/88   Wt Readings from Last 3 Encounters:  11/29/22 152 lb 3.2 oz (69 kg)  05/29/22 148 lb 3.2 oz (67.2 kg)  03/22/22 144 lb (65.3 kg)    Lab Results  Component Value Date   CHOL 154 05/29/2022   CHOL 141 07/14/2021   CHOL 142 07/09/2020   Lab Results  Component Value Date   HDL 48.40 05/29/2022   HDL 44.10 07/14/2021   HDL 54 07/09/2020   Lab Results  Component Value Date   LDLCALC 88 05/29/2022   LDLCALC 84 07/14/2021   LDLCALC 74 07/09/2020   Lab Results  Component Value Date   TRIG 87.0 05/29/2022   TRIG 65.0 07/14/2021   TRIG 55 07/09/2020   Lab Results  Component Value Date   CHOLHDL 3 05/29/2022   CHOLHDL 3 07/14/2021   CHOLHDL 2.6 07/09/2020   No results found for: "LDLDIRECT" Lab Results  Component Value Date   CREATININE 0.82 05/29/2022   BUN 19 05/29/2022   NA 135 05/29/2022   K 4.4 05/29/2022   CL 101 05/29/2022   CO2 25 05/29/2022    The 10-year ASCVD risk score (Arnett DK, et al., 2019) is: 1.9%   Values used to calculate the score:     Age: 52 years     Sex: Female     Is Non-Hispanic African American: No  Diabetic: No     Tobacco smoker: No     Systolic Blood Pressure: 0000000 mmHg     Is BP treated: Yes     HDL Cholesterol: 48.4 mg/dL     Total Cholesterol: 154 mg/dL  I reviewed the patients updated PMH, FH, and SocHx.    Patient Active Problem List   Diagnosis Date Noted   History of melanoma 07/14/2021    Priority: High   Essential hypertension 05/08/2019    Priority: High   Family history of premature CAD 11/20/2018    Priority: High   Anxiety 06/27/2012    Priority: High   Gastroesophageal reflux disease 06/18/2019    Priority: Medium    Carpal tunnel syndrome, bilateral 06/18/2019    Priority: Medium    Primary stress urinary incontinence 07/14/2021    Priority: Low   Allergic rhinitis 12/17/2012    Priority: Low     Allergies: Patient has no known allergies.  Social History: Patient  reports that she has never smoked. She has never used smokeless tobacco. She reports current alcohol use. She reports that she does not use drugs.  Current Meds  Medication Sig   acetaminophen (TYLENOL) 500 MG tablet Take 1 tablet (500 mg total) by mouth every 6 (six) hours as needed (pain).   famotidine (PEPCID) 10 MG tablet Take 10 mg by mouth as needed for heartburn or indigestion. Pepcid AC chewable   hydrochlorothiazide (MICROZIDE) 12.5 MG capsule Take 1 capsule (12.5 mg total) by mouth daily.   ibuprofen (ADVIL) 600 MG tablet Take 1 tablet (600 mg total) by mouth every 6 (six) hours as needed.   metoprolol succinate (TOPROL-XL) 100 MG 24 hr tablet Take 1 tablet (100 mg total) by mouth daily.   Multiple Vitamins-Minerals (AIRBORNE GUMMIES PO) Take by mouth. 3 gummies daily    Review of Systems: Cardiovascular: negative for chest pain, palpitations, leg swelling, orthopnea Respiratory: negative for SOB, wheezing or persistent cough Gastrointestinal: negative for abdominal pain Genitourinary: negative for dysuria or gross hematuria  Objective  Vitals: BP 128/72 Comment: avg home readings  Pulse 79   Temp 98 F (36.7 C)   Ht 5' 2.5" (1.588 m)   Wt 152 lb 3.2 oz (69 kg)   SpO2 99%   BMI 27.39 kg/m  General: no acute distress  Psych:  Alert and oriented, normal mood and affect HEENT:  Normocephalic, atraumatic, supple neck  Cardiovascular:  RRR without murmur. no edema Respiratory:  Good breath sounds bilaterally, CTAB with normal respiratory effort Skin:  Warm, no rashes Neurologic:   Mental status is normal Commons side effects, risks, benefits, and alternatives for medications and treatment plan prescribed today were discussed, and the patient expressed understanding of the given instructions. Patient is instructed to call or message via MyChart if he/she has any questions or concerns regarding our  treatment plan. No barriers to understanding were identified. We discussed Red Flag symptoms and signs in detail. Patient expressed understanding regarding what to do in case of urgent or emergency type symptoms.  Medication list was reconciled, printed and provided to the patient in AVS. Patient instructions and summary information was reviewed with the patient as documented in the AVS. This note was prepared with assistance of Dragon voice recognition software. Occasional wrong-word or sound-a-like substitutions may have occurred due to the inherent limitation

## 2023-01-15 ENCOUNTER — Encounter: Payer: Self-pay | Admitting: Nurse Practitioner

## 2023-01-15 ENCOUNTER — Ambulatory Visit (INDEPENDENT_AMBULATORY_CARE_PROVIDER_SITE_OTHER): Payer: 59 | Admitting: Nurse Practitioner

## 2023-01-15 VITALS — BP 128/72 | HR 68 | Ht 62.5 in | Wt 149.0 lb

## 2023-01-15 DIAGNOSIS — Z01419 Encounter for gynecological examination (general) (routine) without abnormal findings: Secondary | ICD-10-CM | POA: Diagnosis not present

## 2023-01-15 NOTE — Progress Notes (Signed)
   [name] 1971/03/24 [id number]   History:  52 y.o. [id number] presents for annual exam. Monthly cycles. 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 Normal mammogram history. 02/2022 anterior repair with sling for cystocele. HTN managed by PCP.   Gynecologic History Patient's last menstrual period was 01/08/2023. Period Cycle (Days): 28 Period Duration (Days): 4 Period Pattern: Regular Menstrual Flow: Moderate Menstrual Control: Tampon Menstrual Control Change Freq (Hours): 2-3 Dysmenorrhea: (!) Mild Dysmenorrhea Symptoms: Cramping, Nausea, Headache Contraception/Family planning: vasectomy Sexually active: Yes  Health Maintenance Last Pap: 10/08/2018. Results were: Normal neg HPV, 5-year repeat Last mammogram: 10/03/2022. Results were: Normal Last colonoscopy: 11/26/2020. Results were: Normal, 10-year recall Last Dexa: Not indicated  Past medical history, past surgical history, family history and social history were all reviewed and documented in the EPIC chart. Married. Works at preschool. 58 yo son, his girlfriend and baby living with them temporarily. 59 yo daughter.   ROS:  A ROS was performed and pertinent positives and negatives are included.  Exam:  Vitals:   01/15/23 1505  BP: 128/72  Pulse: 68  SpO2: 100%  Weight: 149 lb (67.6 kg)  Height: 5' 2.5" (1.588 m)     Body mass index is 26.82 kg/m.  General appearance:  Normal Thyroid:  Symmetrical, normal in size, without palpable masses or nodularity. Respiratory  Auscultation:  Clear without wheezing or rhonchi Cardiovascular  Auscultation:  Regular rate, without rubs, murmurs or gallops  Edema/varicosities:  Not grossly evident Abdominal  Soft,nontender, without masses, guarding or rebound.  Liver/spleen:  No organomegaly noted  Hernia:  None appreciated  Skin  Inspection:  Grossly normal Breasts: Examined lying and sitting. Bilateral implants noted.   Right: Without masses, retractions, nipple discharge  or axillary adenopathy.   Left: Without masses, retractions, nipple discharge or axillary adenopathy. Gentitourinary   Inguinal/mons:  Normal without inguinal adenopathy  External genitalia:  Normal appearing vulva with no masses, tenderness, or lesions  BUS/Urethra/Skene's glands:  Normal  Vagina:  Normal appearing with normal color and discharge, no lesions  Cervix:  Normal appearing without discharge or lesions  Uterus:  Normal in size, shape and contour.  Midline and mobile, nontender  Adnexa/parametria:     Rt: Normal in size, without masses or tenderness.   Lt: Normal in size, without masses or tenderness.  Anus and perineum: Normal  Digital rectal exam: Deferred  Assessment/Plan:  52 y.o. [id number] for annual exam.   Well female exam with routine gynecological exam - Education provided on SBEs, importance of preventative screenings, current guidelines, high calcium diet, regular exercise, and multivitamin daily. Labs with PCP.   Screening for cervical cancer - 1999 LEEP CIN-1, subsequent paps normal.  Will repeat at 5-year interval per guidelines.  Screening for breast cancer - Normal mammogram history.  Continue annual screenings.  Normal breast exam today.  Screening for colon cancer - Normal colonoscopy 11/2020, will repeat at 10-year interval per GI recommendation.   Screening for osteoporosis - Average risk. Will plan for DXA at age 33. Recommend increasing exercise.   Return in 1 year for annual.      [name], 3:25 PM 01/15/2023

## 2023-01-22 ENCOUNTER — Encounter: Payer: Self-pay | Admitting: Nurse Practitioner

## 2023-01-22 ENCOUNTER — Ambulatory Visit: Payer: 59 | Admitting: Nurse Practitioner

## 2023-01-22 VITALS — BP 122/80 | HR 78

## 2023-01-22 DIAGNOSIS — L309 Dermatitis, unspecified: Secondary | ICD-10-CM

## 2023-01-22 MED ORDER — TRIAMCINOLONE ACETONIDE 0.5 % EX OINT: 1.0000 | TOPICAL_OINTMENT | Freq: Two times a day (BID) | CUTANEOUS | 0 refills | Status: DC

## 2023-01-22 NOTE — Progress Notes (Signed)
   Acute Office Visit  Subjective:    Patient ID: [name], female    DOB: 1971-02-11, 52 y.o.   MRN: [id number]   HPI 52 y.o. presents today for rash on right areola x 1 week. Area became about the size of a quarter, deceased some in size but still present. Itchy, red, slightly raised. Neosporin used with no improvement.   Review of Systems  Constitutional: Negative.   Skin:  Positive for rash (Right breast).       Objective:    Physical Exam Constitutional:      Appearance: Normal appearance.  Chest:  Breasts:    Right: Skin change present. No swelling, bleeding, inverted nipple, mass, nipple discharge or tenderness.     Left: Normal.       BP 122/80 (BP Location: Right Arm, Patient Position: Sitting, Cuff Size: Normal)   Pulse 78   LMP 01/08/2023   SpO2 99%  Wt Readings from Last 3 Encounters:  01/15/23 149 lb (67.6 kg)  11/29/22 152 lb 3.2 oz (69 kg)  05/29/22 148 lb 3.2 oz (67.2 kg)        Patient informed chaperone available to be present for breast and/or pelvic exam. Patient has requested no chaperone to be present. Patient has been advised what will be completed during breast and pelvic exam.   Assessment & Plan:   Problem List Items Addressed This Visit   None Visit Diagnoses     Dermatitis    -  Primary   Relevant Medications   triamcinolone ointment (KENALOG) 0.5 %      Plan: Triamcinolone BID x 7-10 days. Avoid scratching. Will return if symptoms worsen or do not improve.      [name], 1:58 PM 01/22/2023

## 2023-04-07 DIAGNOSIS — R051 Acute cough: Secondary | ICD-10-CM | POA: Diagnosis not present

## 2023-04-07 DIAGNOSIS — J019 Acute sinusitis, unspecified: Secondary | ICD-10-CM | POA: Diagnosis not present

## 2023-05-03 ENCOUNTER — Encounter (INDEPENDENT_AMBULATORY_CARE_PROVIDER_SITE_OTHER): Payer: Self-pay

## 2023-05-14 DIAGNOSIS — Z08 Encounter for follow-up examination after completed treatment for malignant neoplasm: Secondary | ICD-10-CM | POA: Diagnosis not present

## 2023-05-14 DIAGNOSIS — Z85820 Personal history of malignant melanoma of skin: Secondary | ICD-10-CM | POA: Diagnosis not present

## 2023-05-14 DIAGNOSIS — L821 Other seborrheic keratosis: Secondary | ICD-10-CM | POA: Diagnosis not present

## 2023-06-08 ENCOUNTER — Encounter: Payer: 59 | Admitting: Family Medicine

## 2023-06-17 ENCOUNTER — Other Ambulatory Visit: Payer: Self-pay | Admitting: Family Medicine

## 2023-09-03 ENCOUNTER — Ambulatory Visit (INDEPENDENT_AMBULATORY_CARE_PROVIDER_SITE_OTHER): Payer: 59 | Admitting: Family Medicine

## 2023-09-03 ENCOUNTER — Encounter: Payer: Self-pay | Admitting: Family Medicine

## 2023-09-03 VITALS — BP 110/70 | HR 81 | Temp 98.3°F | Ht 62.5 in | Wt 152.6 lb

## 2023-09-03 DIAGNOSIS — I10 Essential (primary) hypertension: Secondary | ICD-10-CM

## 2023-09-03 DIAGNOSIS — Z0001 Encounter for general adult medical examination with abnormal findings: Secondary | ICD-10-CM

## 2023-09-03 DIAGNOSIS — M461 Sacroiliitis, not elsewhere classified: Secondary | ICD-10-CM

## 2023-09-03 DIAGNOSIS — Z85820 Personal history of malignant melanoma of skin: Secondary | ICD-10-CM

## 2023-09-03 DIAGNOSIS — K219 Gastro-esophageal reflux disease without esophagitis: Secondary | ICD-10-CM

## 2023-09-03 DIAGNOSIS — Z8249 Family history of ischemic heart disease and other diseases of the circulatory system: Secondary | ICD-10-CM

## 2023-09-03 LAB — CBC WITH DIFFERENTIAL/PLATELET
Basophils Absolute: 0 10*3/uL (ref 0.0–0.1)
Basophils Relative: 0.7 % (ref 0.0–3.0)
Eosinophils Absolute: 0.5 10*3/uL (ref 0.0–0.7)
Eosinophils Relative: 8 % — ABNORMAL HIGH (ref 0.0–5.0)
HCT: 39 % (ref 36.0–46.0)
Hemoglobin: 13 g/dL (ref 12.0–15.0)
Lymphocytes Relative: 32.9 % (ref 12.0–46.0)
Lymphs Abs: 2.1 10*3/uL (ref 0.7–4.0)
MCHC: 33.2 g/dL (ref 30.0–36.0)
MCV: 90.4 fL (ref 78.0–100.0)
Monocytes Absolute: 0.5 10*3/uL (ref 0.1–1.0)
Monocytes Relative: 8.7 % (ref 3.0–12.0)
Neutro Abs: 3.1 10*3/uL (ref 1.4–7.7)
Neutrophils Relative %: 49.7 % (ref 43.0–77.0)
Platelets: 281 10*3/uL (ref 150.0–400.0)
RBC: 4.31 Mil/uL (ref 3.87–5.11)
RDW: 15.2 % (ref 11.5–15.5)
WBC: 6.3 10*3/uL (ref 4.0–10.5)

## 2023-09-03 LAB — COMPREHENSIVE METABOLIC PANEL
ALT: 22 U/L (ref 0–35)
AST: 23 U/L (ref 0–37)
Albumin: 4.3 g/dL (ref 3.5–5.2)
Alkaline Phosphatase: 48 U/L (ref 39–117)
BUN: 20 mg/dL (ref 6–23)
CO2: 26 meq/L (ref 19–32)
Calcium: 9.5 mg/dL (ref 8.4–10.5)
Chloride: 101 meq/L (ref 96–112)
Creatinine, Ser: 0.69 mg/dL (ref 0.40–1.20)
GFR: 98.71 mL/min (ref >60.00)
Glucose, Bld: 95 mg/dL (ref 70–99)
Potassium: 3.7 meq/L (ref 3.5–5.1)
Sodium: 136 meq/L (ref 135–145)
Total Bilirubin: 0.8 mg/dL (ref 0.2–1.2)
Total Protein: 6.9 g/dL (ref 6.0–8.3)

## 2023-09-03 LAB — TSH: TSH: 1.9 u[IU]/mL (ref 0.35–5.50)

## 2023-09-03 LAB — LIPID PANEL
Cholesterol: 138 mg/dL (ref 0–200)
HDL: 44.6 mg/dL (ref >39.00)
LDL Cholesterol: 81 mg/dL (ref 0–99)
NonHDL: 93.89
Total CHOL/HDL Ratio: 3
Triglycerides: 66 mg/dL (ref 0.0–149.0)
VLDL: 13.2 mg/dL (ref 0.0–40.0)

## 2023-09-03 NOTE — Progress Notes (Signed)
Subjective  Chief Complaint  Patient presents with   Annual Exam    Pt here for Annual Exam and is currently fasting    Hypertension    HPI: [name] is a 52 y.o. female who presents to [facility] [place] today for a Female Wellness Visit. She also has the concerns and/or needs as listed above in the chief complaint. These will be addressed in addition to the Health Maintenance Visit.   Wellness Visit: annual visit with health maintenance review and exam  HM: sees gyn and due for pap in January. Mammo and CRC screen are current and normal. Eligible for shingrix: defers today due to upcoming holiday but will schedule nurse visit later. Doing well overall.  Chronic disease f/u and/or acute problem visit: (deemed necessary to be done in addition to the wellness visit): Discussed the use of AI scribe software for clinical note transcription with the patient, who gave verbal consent to proceed. Hypertension follow-up: Patient with whitecoat syndrome in the office.  Very elevated reading here but she checks her blood blood pressures at home regularly.  They run perfect, we added HCTZ 12.5 to metoprolol XL 100 mg 6 months ago and blood pressures are normal since.  Average 110/70.  No chest pain, palpitations, lower extremity edema. patient reports acute onset of low back pain, localized to the left side and not radiating down the leg. The pain is more pronounced when sitting and has been somewhat relieved by icing and taking over-the-counter Motrin. The patient denies any muscle spasms but describes the pain moderate to severe at times.  She has had this problem before.  She thinks it is related to her sciatic nerve but no leg pain.  No injury or strain.  No bowel or bladder problems.  She has been taking a few Advil with some relief. GERD mostly asymptomatic but will take Pepcid as needed.    Assessment  1. Encounter for well adult exam with abnormal findings   2. White  coat syndrome with diagnosis of hypertension   3. Family history of premature CAD   4. Gastroesophageal reflux disease, unspecified whether esophagitis present   5. History of melanoma   6. Sacroiliitis [facility]: [facility] Rhonda W)      Plan  Female Wellness Visit: Age appropriate Health Maintenance and Prevention measures were discussed with patient. Included topics are cancer screening recommendations, ways to keep healthy (see AVS) including dietary and exercise recommendations, regular eye and dental care, use of seat belts, and avoidance of moderate alcohol use and tobacco use.  She will schedule Pap smear with gynecology next year.  Other screens current BMI: discussed patient's BMI and encouraged positive lifestyle modifications to help get to or maintain a target BMI. HM needs and immunizations were addressed and ordered. See below for orders. See HM and immunization section for updates.  Defers Shingrix till after the holidays. Routine labs and screening tests ordered including cmp, cbc and lipids where appropriate. Discussed recommendations regarding Vit D and calcium supplementation (see AVS)  Chronic disease management visit and/or acute problem visit: Assessment and Plan    Sacroiliitis, left greater than right.  Education given Acute onset of left-sided low back pain localized to the sacroiliac joint, exacerbated by sitting and relieved by standing. No radiation down the leg. Tenderness noted on physical exam. Likely inflammation of the sacroiliac joint. Discussed Motrin 600 mg twice daily with meals for one week, ice for joint inflammation, heat for muscle spasms, and stretching  exercises. Patient prefers to avoid strong pain medications unless necessary. - Motrin 600 mg twice daily with meals for one week - Apply ice to the affected area - Perform stretching exercises, handout given from  sports medicine patient advisor - Follow up if no improvement by mid-week  Hypertension with whitecoat  syndrome Blood pressure readings at home are well-controlled with current medication regimen. Elevated reading noted in the office, likely due to environmental factors or white coat syndrome. Discussed the importance of continued home monitoring. - Continue current antihypertensive regimen, Toprol-XL 100 and HCTZ 12.5 daily.  Check renal function electrolytes. - Monitor blood pressure at home  General Health Maintenance Due for a Pap smear in January. Mammogram results are normal. Discussed shingles vaccination, patient prefers to wait until after Christmas. Advised that a nurse visit can be scheduled for the vaccination. - Schedule Pap smear - Administer shingles vaccination after Christmas via nurse visit - Obtain lab work.    Follow up: 12 months for cpe  Orders Placed This Encounter  Procedures   CBC with Differential/Platelet   Comprehensive metabolic panel   Lipid panel   TSH   No orders of the defined types were placed in this encounter.     Body mass index is 27.47 kg/m. Wt Readings from Last 3 Encounters:  09/03/23 152 lb 9.6 oz (69.2 kg)  01/15/23 149 lb (67.6 kg)  11/29/22 152 lb 3.2 oz (69 kg)     Patient Active Problem List   Diagnosis Date Noted Date Diagnosed   History of melanoma 07/14/2021     Priority: High    Face, 20s. Sees Dr. Terri Piedra annually    Essential hypertension 05/08/2019     Priority: High   Family history of premature CAD 11/20/2018     Priority: High    Mom    Anxiety 06/27/2012     Priority: High    Rare xanax use    Gastroesophageal reflux disease 06/18/2019     Priority: Medium     GI 06/2019    Carpal tunnel syndrome, bilateral 06/18/2019     Priority: Medium    Primary stress urinary incontinence 07/14/2021     Priority: Low   Allergic rhinitis 12/17/2012     Priority: Low   Health Maintenance  Topic Date Due   Zoster Vaccines- Shingrix (1 of 2) Never done   INFLUENZA VACCINE  12/17/2023 (Originally 04/19/2023)    Cervical Cancer Screening (HPV/Pap Cotest)  01/17/2024 (Originally 10/09/2023)   MAMMOGRAM  10/04/2023   DTaP/Tdap/Td (2 - Td or Tdap) 09/05/2027   Colonoscopy  11/27/2030   Hepatitis C Screening  Completed   HIV Screening  Completed   HPV VACCINES  Aged Out   COVID-19 Vaccine  Discontinued   Immunization History  Administered Date(s) Administered   Influenza,trivalent, recombinat, inj, PF 06/19/2011, 06/10/2013   Tdap 09/04/2017   We updated and reviewed the patient's past history in detail and it is documented below. Allergies: Patient has no known allergies. Past Medical History Patient  has a past medical history of Anemia, Anxiety, CIN I (cervical intraepithelial neoplasia I), Depression, Essential hypertension (05/08/2019), Family history of premature CAD (11/20/2018), GERD (gastroesophageal reflux disease), H/O gestational diabetes mellitus, not currently pregnant, Headache, History of melanoma (07/14/2021), Prolapse of anterior vaginal wall (2023), SUI (stress urinary incontinence, female) (2023), Tinnitus aurium, bilateral, and Wears glasses. Past Surgical History Patient  has a past surgical history that includes Cervical biopsy w/ loop electrode excision (1999); Augmentation mammaplasty (2005); Bunionectomy (Left,  1997); ear drum surgery; Colonoscopy (11/26/2020); Cystocele repair (N/A, 03/06/2022); Bladder suspension (N/A, 03/06/2022); and Cystoscopy (N/A, 03/06/2022). Family History: Patient family history includes Cancer in her father; Cerebral aneurysm in her maternal grandfather; Diabetes in her brother and mother; Epilepsy in her son; Healthy in her daughter; Heart disease in her maternal grandmother, mother, paternal grandfather, and paternal grandmother; Hyperlipidemia in her mother; Hypertension in her mother and paternal grandmother; Learning disabilities in her maternal aunt; Lung cancer in her mother; Prostate cancer in her paternal grandfather; Renal cancer in her  father. Social History:  Patient  reports that she has never smoked. She has never used smokeless tobacco. She reports current alcohol use. She reports that she does not use drugs.  Review of Systems: Constitutional: negative for fever or malaise Ophthalmic: negative for photophobia, double vision or loss of vision Cardiovascular: negative for chest pain, dyspnea on exertion, or new LE swelling Respiratory: negative for SOB or persistent cough Gastrointestinal: negative for abdominal pain, change in bowel habits or melena Genitourinary: negative for dysuria or gross hematuria, no abnormal uterine bleeding or disharge Musculoskeletal: negative for new gait disturbance or muscular weakness Integumentary: negative for new or persistent rashes, no breast lumps Neurological: negative for TIA or stroke symptoms Psychiatric: negative for SI or delusions Allergic/Immunologic: negative for hives  Patient Care Team    Relationship Specialty Notifications Start End  Willow Ora, MD PCP - General Family Medicine  11/20/18   Mammography, [facility]  Diagnostic Radiology  11/20/18   Shellia Cleverly, DO Consulting Physician Gastroenterology  07/08/19   Cherlyn Roberts, MD Referring Physician Dermatology  07/14/21   Marguerita Beards, MD Consulting Physician Gynecology  07/14/21    Comment: Urogynecology    Objective  Vitals: BP 110/70 Comment: by consistent home readings  Pulse 81   Temp 98.3 F (36.8 C)   Ht 5' 2.5" (1.588 m)   Wt 152 lb 9.6 oz (69.2 kg)   SpO2 98%   BMI 27.47 kg/m  General:  Well developed, well nourished, no acute distress  Psych:  Alert and orientedx3,normal mood and affect HEENT:  Normocephalic, atraumatic, non-icteric sclera,  supple neck without adenopathy, mass or thyromegaly Cardiovascular:  Normal S1, S2, RRR without gallop, rub or murmur Respiratory:  Good breath sounds bilaterally, CTAB with normal respiratory effort Gastrointestinal: normal bowel sounds,  soft, non-tender, no noted masses. No HSM MSK: extremities without edema, joints without erythema or swelling Neurologic:    Mental status is normal.  Gross motor and sensory exams are normal.  No tremor Back: Left greater than right SI joint tenderness present.  Pain with left and right flexion.  Full range of motion.  Negative straight leg raise bilaterally.  No sciatic notch tenderness.  No muscle spasm.  Commons side effects, risks, benefits, and alternatives for medications and treatment plan prescribed today were discussed, and the patient expressed understanding of the given instructions. Patient is instructed to call or message via MyChart if he/she has any questions or concerns regarding our treatment plan. No barriers to understanding were identified. We discussed Red Flag symptoms and signs in detail. Patient expressed understanding regarding what to do in case of urgent or emergency type symptoms.  Medication list was reconciled, printed and provided to the patient in AVS. Patient instructions and summary information was reviewed with the patient as documented in the AVS. This note was prepared with assistance of Dragon voice recognition software. Occasional wrong-word or sound-a-like substitutions may have occurred due to the inherent limitations  of voice recognition software

## 2023-09-03 NOTE — Patient Instructions (Signed)
Please return in 12 months for your annual complete physical; please come fasting.   You are due for your pap smear in January.   I will release your lab results to you on your MyChart account with further instructions. You may see the results before I do, but when I review them I will send you a message with my report or have my assistant call you if things need to be discussed. Please reply to my message with any questions. Thank you!   If you have any questions or concerns, please don't hesitate to send me a message via MyChart or call the office at [phone]. Thank you for visiting with [place] today! It's our pleasure caring for you.   Preventive Care 29-88 Years Old, Female Preventive care refers to lifestyle choices and visits with your health care provider that can promote health and wellness. Preventive care visits are also called wellness exams. What can I expect for my preventive care visit? Counseling Your health care provider may ask you questions about your: Medical history, including: Past medical problems. Family medical history. Pregnancy history. Current health, including: Menstrual cycle. Method of birth control. Emotional well-being. Home life and relationship well-being. Sexual activity and sexual health. Lifestyle, including: Alcohol, nicotine or tobacco, and drug use. Access to firearms. Diet, exercise, and sleep habits. Work and work Astronomer. Sunscreen use. Safety issues such as seatbelt and bike helmet use. Physical exam Your health care provider will check your: Height and weight. These may be used to calculate your BMI (body mass index). BMI is a measurement that tells if you are at a healthy weight. Waist circumference. This measures the distance around your waistline. This measurement also tells if you are at a healthy weight and may help predict your risk of certain diseases, such as type 2 diabetes and high blood pressure. Heart rate and blood  pressure. Body temperature. Skin for abnormal spots. What immunizations do I need?  Vaccines are usually given at various ages, according to a schedule. Your health care provider will recommend vaccines for you based on your age, medical history, and lifestyle or other factors, such as travel or where you work. What tests do I need? Screening Your health care provider may recommend screening tests for certain conditions. This may include: Lipid and cholesterol levels. Diabetes screening. This is done by checking your blood sugar (glucose) after you have not eaten for a while (fasting). Pelvic exam and Pap test. Hepatitis B test. Hepatitis C test. HIV (human immunodeficiency virus) test. STI (sexually transmitted infection) testing, if you are at risk. Lung cancer screening. Colorectal cancer screening. Mammogram. Talk with your health care provider about when you should start having regular mammograms. This may depend on whether you have a family history of breast cancer. BRCA-related cancer screening. This may be done if you have a family history of breast, ovarian, tubal, or peritoneal cancers. Bone density scan. This is done to screen for osteoporosis. Talk with your health care provider about your test results, treatment options, and if necessary, the need for more tests. Follow these instructions at home: Eating and drinking  Eat a diet that includes fresh fruits and vegetables, whole grains, lean protein, and low-fat dairy products. Take vitamin and mineral supplements as recommended by your health care provider. Do not drink alcohol if: Your health care provider tells you not to drink. You are pregnant, may be pregnant, or are planning to become pregnant. If you drink alcohol: Limit how much you have to  0-1 drink a day. Know how much alcohol is in your drink. In the [place] one drink equals one 12 oz bottle of beer (355 mL), one 5 oz glass of wine (148 mL), or one 1 oz glass of  hard liquor (44 mL). Lifestyle Brush your teeth every morning and night with fluoride toothpaste. Floss one time each day. Exercise for at least 30 minutes 5 or more days each week. Do not use any products that contain nicotine or tobacco. These products include cigarettes, chewing tobacco, and vaping devices, such as e-cigarettes. If you need help quitting, ask your health care provider. Do not use drugs. If you are sexually active, practice safe sex. Use a condom or other form of protection to prevent STIs. If you do not wish to become pregnant, use a form of birth control. If you plan to become pregnant, see your health care provider for a prepregnancy visit. Take aspirin only as told by your health care provider. Make sure that you understand how much to take and what form to take. Work with your health care provider to find out whether it is safe and beneficial for you to take aspirin daily. Find healthy ways to manage stress, such as: Meditation, yoga, or listening to music. Journaling. Talking to a trusted person. Spending time with friends and family. Minimize exposure to UV radiation to reduce your risk of skin cancer. Safety Always wear your seat belt while driving or riding in a vehicle. Do not drive: If you have been drinking alcohol. Do not ride with someone who has been drinking. When you are tired or distracted. While texting. If you have been using any mind-altering substances or drugs. Wear a helmet and other protective equipment during sports activities. If you have firearms in your house, make sure you follow all gun safety procedures. Seek help if you have been physically or sexually abused. What's next? Visit your health care provider once a year for an annual wellness visit. Ask your health care provider how often you should have your eyes and teeth checked. Stay up to date on all vaccines. This information is not intended to replace advice given to you by your  health care provider. Make sure you discuss any questions you have with your health care provider. Document Revised: 03/02/2021 Document Reviewed: 03/02/2021 Elsevier Patient Education  2024 [employer].

## 2023-09-04 ENCOUNTER — Encounter: Payer: Self-pay | Admitting: Family Medicine

## 2023-09-04 NOTE — Progress Notes (Signed)
Labs reviewed.  The 10-year ASCVD risk score (Arnett DK, et al., 2019) is: 1.3%   Values used to calculate the score:     Age: 52 years     Sex: Female     Is Non-Hispanic African American: No     Diabetic: No     Tobacco smoker: No     Systolic Blood Pressure: 110 mmHg     Is BP treated: Yes     HDL Cholesterol: 44.6 mg/dL     Total Cholesterol: 138 mg/dL

## 2023-10-15 ENCOUNTER — Encounter: Payer: Self-pay | Admitting: Family Medicine

## 2023-10-15 ENCOUNTER — Ambulatory Visit: Payer: 59

## 2023-10-15 ENCOUNTER — Ambulatory Visit (INDEPENDENT_AMBULATORY_CARE_PROVIDER_SITE_OTHER): Payer: 59 | Admitting: Family Medicine

## 2023-10-15 VITALS — BP 134/86 | HR 84 | Temp 97.9°F | Ht 62.5 in | Wt 148.8 lb

## 2023-10-15 DIAGNOSIS — M5416 Radiculopathy, lumbar region: Secondary | ICD-10-CM

## 2023-10-15 DIAGNOSIS — M25562 Pain in left knee: Secondary | ICD-10-CM

## 2023-10-15 MED ORDER — GABAPENTIN 300 MG PO CAPS: ORAL_CAPSULE | ORAL | 0 refills | Status: DC

## 2023-10-15 MED ORDER — PREDNISONE 10 MG PO TABS: ORAL_TABLET | ORAL | 0 refills | Status: DC

## 2023-10-15 NOTE — Progress Notes (Signed)
Subjective  CC:  Chief Complaint  Patient presents with   Back Pain    Back pain has gotten worse and has gone to her Lt knee. Mammogram is scheduled for march    HPI: [name] is a 53 y.o. female who presents to the office today to address the problems listed above in the chief complaint. Discussed the use of AI scribe software for clinical note transcription with the patient, who gave verbal consent to proceed.  History of Present Illness   The patient, with a history of back pain that began in mid-December, reports a gradual worsening of symptoms over the past month. The pain, initially localized to the back, has now radiated down the leg, causing discomfort in the front of the kneecap. The patient describes the pain as a 'shooting' sensation, particularly noticeable at night, leading to sleep disturbances.  In addition to the back and leg pain, the patient also reports urinary incontinence, a problem she had previously undergone surgery for. The patient has been managing the pain with Motrin, taken as needed, but expresses concern about potential stomach issues related to the medication.  The patient denies any weakness in the legs, but notes a pulling sensation when lifting the left knee towards the chest. The right side is reportedly unaffected. The patient also reports tenderness in the back muscles, particularly when bending forward or arching back.  The patient has not previously had her back x-rayed. She also expresses concern about a tender feeling in the knee, although she has not noticed any swelling. The patient is unsure if the knee discomfort is related to the shooting pain down the leg.       Assessment  1. Left lumbar radiculopathy   2. Acute pain of left knee      Plan  Assessment and Plan    Sciatica/lumbar radiculopathy Worsening back pain radiating to the front of the kneecap, consistent with sciatica. Symptoms present for over a month, exacerbated at  night, causing sleep disturbances. No urinary incontinence or significant leg weakness. Physical exam reveals back muscle tenderness and pain upon certain movements. Differential includes pinched nerve or sciatic nerve involvement. Discussed potential stomach discomfort from Motrin use. - Prescribe prednisone to reduce inflammation - Prescribe gabapentin for nighttime nerve pain, with the option to take twice daily if tolerated - Order x-rays of the back and knee to assess for arthritis or other bone abnormalities - Refer to physical therapy for rehabilitation  General Health Maintenance Frequent Motrin use for pain management may be causing stomach discomfort. - Advise monitoring for stomach discomfort and consider alternative pain management options if necessary  Follow-up - Review x-ray results once available - Follow up in 7-10 days to assess improvement and adjust treatment plan as necessary.        Orders Placed This Encounter  Procedures   DG Lumbar Spine Complete   DG Knee Complete 4 Views Left   Ambulatory referral to Physical Therapy   Meds ordered this encounter  Medications   predniSONE (DELTASONE) 10 MG tablet    Sig: Take 4 tabs qd x 2 days, 3 qd x 2 days, 2 qd x 2d, 1qd x 3 days    Dispense:  21 tablet    Refill:  0   gabapentin (NEURONTIN) 300 MG capsule    Sig: Take 1 capsule (300 mg total) by mouth at bedtime for 7 days, THEN 1 capsule (300 mg total) 2 (two) times daily.    Dispense:  60 capsule    Refill:  0     I reviewed the patients updated PMH, FH, and SocHx.    Patient Active Problem List   Diagnosis Date Noted   History of melanoma 07/14/2021    Priority: High   Essential hypertension 05/08/2019    Priority: High   Family history of premature CAD 11/20/2018    Priority: High   Anxiety 06/27/2012    Priority: High   Gastroesophageal reflux disease 06/18/2019    Priority: Medium    Carpal tunnel syndrome, bilateral 06/18/2019    Priority:  Medium    Primary stress urinary incontinence 07/14/2021    Priority: Low   Allergic rhinitis 12/17/2012    Priority: Low   Current Meds  Medication Sig   acetaminophen (TYLENOL) 500 MG tablet Take 1 tablet (500 mg total) by mouth every 6 (six) hours as needed (pain).   famotidine (PEPCID) 10 MG tablet Take 10 mg by mouth as needed for heartburn or indigestion. Pepcid AC chewable   gabapentin (NEURONTIN) 300 MG capsule Take 1 capsule (300 mg total) by mouth at bedtime for 7 days, THEN 1 capsule (300 mg total) 2 (two) times daily.   hydrochlorothiazide (MICROZIDE) 12.5 MG capsule Take 1 capsule (12.5 mg total) by mouth daily.   ibuprofen (ADVIL) 600 MG tablet Take 1 tablet (600 mg total) by mouth every 6 (six) hours as needed.   metoprolol succinate (TOPROL-XL) 100 MG 24 hr tablet TAKE 1 TABLET BY MOUTH EVERY DAY   Multiple Vitamins-Minerals (AIRBORNE GUMMIES PO) Take by mouth. 3 gummies daily   predniSONE (DELTASONE) 10 MG tablet Take 4 tabs qd x 2 days, 3 qd x 2 days, 2 qd x 2d, 1qd x 3 days   triamcinolone ointment (KENALOG) 0.5 % Apply 1 Application topically 2 (two) times daily.    Allergies: Patient has no known allergies. Family History: Patient family history includes Cancer in her father; Cerebral aneurysm in her maternal grandfather; Diabetes in her brother and mother; Epilepsy in her son; Healthy in her daughter; Heart disease in her maternal grandmother, mother, paternal grandfather, and paternal grandmother; Hyperlipidemia in her mother; Hypertension in her mother and paternal grandmother; Learning disabilities in her maternal aunt; Lung cancer in her mother; Prostate cancer in her paternal grandfather; Renal cancer in her father. Social History:  Patient  reports that she has never smoked. She has never used smokeless tobacco. She reports current alcohol use. She reports that she does not use drugs.  Review of Systems: Constitutional: Negative for fever malaise or  anorexia Cardiovascular: negative for chest pain Respiratory: negative for SOB or persistent cough Gastrointestinal: negative for abdominal pain  Objective  Vitals: BP 134/86   Pulse 84   Temp 97.9 F (36.6 C)   Ht 5' 2.5" (1.588 m)   Wt 148 lb 12.8 oz (67.5 kg)   SpO2 95%   BMI 26.78 kg/m  General: no acute distress , A&Ox3, moves well. Appears comfortable Back: from, left sciatic notch and SI joint ttp. Neg seated SLR, + 2 DTRs Knee: left knee with crepitus Nl strength  Commons side effects, risks, benefits, and alternatives for medications and treatment plan prescribed today were discussed, and the patient expressed understanding of the given instructions. Patient is instructed to call or message via MyChart if he/she has any questions or concerns regarding our treatment plan. No barriers to understanding were identified. We discussed Red Flag symptoms and signs in detail. Patient expressed understanding regarding what to do in  case of urgent or emergency type symptoms.  Medication list was reconciled, printed and provided to the patient in AVS. Patient instructions and summary information was reviewed with the patient as documented in the AVS. This note was prepared with assistance of Dragon voice recognition software. Occasional wrong-word or sound-a-like substitutions may have occurred due to the inherent limitations of voice recognition software

## 2023-10-15 NOTE — Patient Instructions (Signed)
Please follow up if symptoms do not improve or as needed.    VISIT SUMMARY:  During today's visit, we discussed your worsening back pain that has been radiating down your leg and causing discomfort in your knee. We also addressed your concerns about urinary incontinence and potential stomach issues from Motrin use. A thorough examination was conducted, and a treatment plan was established to help manage your symptoms and improve your overall well-being.  YOUR PLAN:  -SCIATICA/lumbar radiculopathy: Sciatica is a condition where pain radiates along the path of the sciatic nerve, which runs from your lower back through your hips and down each leg. To help reduce inflammation and manage your pain, I have prescribed prednisone and gabapentin. Prednisone will help reduce inflammation, and gabapentin will help manage nighttime nerve pain. You can take gabapentin twice daily if you tolerate it well. We will also get x-rays of your back and knee to check for any bone abnormalities, and I am referring you to physical therapy for rehabilitation.  -GENERAL HEALTH MAINTENANCE: Frequent use of Motrin for pain management may cause stomach discomfort. Please monitor for any stomach issues and consider alternative pain management options if necessary. Maintaining a healthy lifestyle with regular exercise, a balanced diet, and adequate sleep is also important for your overall well-being.  INSTRUCTIONS:  Please get the x-rays of your back and knee done as soon as possible. We will review the results once they are available. Follow up with me in 7-10 days to assess your improvement and adjust the treatment plan if necessary.  Radicular Pain Radicular pain is a type of pain that spreads from your back or neck along a spinal nerve. Spinal nerves are nerves that leave the spinal cord and go to the muscles. Radicular pain is sometimes called radiculopathy, radiculitis, or a pinched nerve. When you have this type of pain,  you may also have weakness, numbness, or tingling in the area of your body that is supplied by the nerve. The pain may feel sharp and burning. Depending on which spinal nerve is affected, the pain may occur in the: Neck area (cervical radicular pain). You may also feel pain, numbness, weakness, or tingling in the arms. Mid-spine area (thoracic radicular pain). You would feel this pain in the back and chest. This type is rare. Lower back area (lumbar radicular pain). You would feel this pain as low back pain. You may feel pain, numbness, weakness, or tingling in the buttocks or legs. Sciatica is a type of lumbar radicular pain that shoots down the back of the leg. Radicular pain occurs when one of the spinal nerves becomes irritated or squeezed (compressed). It is often caused by something pushing on a spinal nerve, such as one of the bones of the spine (vertebrae) or one of the round cushions between vertebrae (intervertebral disks). This can result from: An injury. Wear and tear or aging of a disk. The growth of a bone spur that pushes on the nerve. Radicular pain often goes away when you follow instructions from your health care provider for relieving pain at home. How is this treated? Treatment may depend on the cause of the condition and may include: Working with a physical therapist. Taking pain medicine. Applying heat or ice or both to the affected areas. Doing stretches to improve flexibility. Having surgery. This may be needed if other treatments do not help. Different types of surgery may be done depending on the cause of this condition. Follow these instructions at home: Managing pain  If directed, put ice on the affected area. To do this: Put ice in a plastic bag. Place a towel between your skin and the bag. Leave the ice on for 20 minutes, 2-3 times a day. Remove the ice if your skin turns bright red. This is very important. If you cannot feel pain, heat, or cold, you have a  greater risk of damage to the area. If directed, apply heat to the affected area as often as told by your health care provider. Use the heat source that your health care provider recommends, such as a moist heat pack or a heating pad. Place a towel between your skin and the heat source. Leave the heat on for 20-30 minutes. Remove the heat if your skin turns bright red. This is especially important if you are unable to feel pain, heat, or cold. You have a greater risk of getting burned. Activity Do not sit or rest in bed for long periods of time. Try to stay as active as possible. Ask your health care provider what type of exercise or activity is best for you. Avoid activities that make your pain worse, such as bending and lifting. You may have to avoid lifting. Ask your health care provider how much you can safely lift. Practice using proper technique when lifting items. Proper lifting technique involves bending your knees and rising up. Do strength and range-of-motion exercises only as told by your health care provider or physical therapist. General instructions Take over-the-counter and prescription medicines only as told by your health care provider. Pay attention to any changes in your symptoms. Keep all follow-up visits. This is important. Contact a health care provider if: Your pain and other symptoms get worse. Your pain medicine is not helping. Your pain has not improved after a few weeks of home care. You have a fever. Get help right away if: You have severe pain, weakness, or numbness. You have difficulty with bladder or bowel control. Summary Radicular pain is a type of pain that spreads from your back or neck along a spinal nerve. When you have radicular pain, you may also have weakness, numbness, or tingling in the area of your body that is supplied by the nerve. The pain may feel sharp or burning. Radicular pain may be treated with ice, heat, medicines, or physical  therapy. This information is not intended to replace advice given to you by your health care provider. Make sure you discuss any questions you have with your health care provider. Document Revised: 03/10/2021 Document Reviewed: 03/10/2021 Elsevier Patient Education  2024 [employer].

## 2023-10-22 ENCOUNTER — Encounter: Payer: Self-pay | Admitting: Family Medicine

## 2023-10-22 NOTE — Progress Notes (Signed)
See mychart note Dear [name], Your back and knee xrays show some arthritis which could be contributing to your pain.  Sincerely, Dr. Mardelle Matte

## 2023-10-24 LAB — HM MAMMOGRAPHY

## 2023-11-05 ENCOUNTER — Ambulatory Visit: Payer: 59 | Admitting: Physical Therapy

## 2023-11-05 ENCOUNTER — Encounter: Payer: Self-pay | Admitting: Physical Therapy

## 2023-11-05 DIAGNOSIS — M5459 Other low back pain: Secondary | ICD-10-CM

## 2023-11-05 DIAGNOSIS — M6281 Muscle weakness (generalized): Secondary | ICD-10-CM

## 2023-11-05 DIAGNOSIS — G8929 Other chronic pain: Secondary | ICD-10-CM

## 2023-11-05 NOTE — Therapy (Signed)
OUTPATIENT PHYSICAL THERAPY THORACOLUMBAR EVALUATION   Patient Name: [name] MRN: [id number] DOB:06-07-1971, 53 y.o., female Today's Date: 11/05/2023  END OF SESSION:  PT End of Session - 11/05/23 1326     Visit Number 1    Number of Visits 16    Date for PT Re-Evaluation 01/28/24    Authorization Type VL 30 Jari Favre    Authorization - Visit Number 1    Authorization - Number of Visits 30    Progress Note Due on Visit 10    PT Start Time 1340    PT Stop Time 1424    PT Time Calculation (min) 44 min    Activity Tolerance Patient tolerated treatment well    Behavior During Therapy WFL for tasks assessed/performed             Past Medical History:  Diagnosis Date   Anemia    as a child and during pregnancy   Anxiety    CIN I (cervical intraepithelial neoplasia I)    1999-LEEP, normal Paps after    Depression    Essential hypertension 05/08/2019   Patient follows with Dr. Asencion Partridge, PCP. Theron Arista 07/14/21 as of 02/28/22.   Family history of premature CAD 11/20/2018   Mom   GERD (gastroesophageal reflux disease)    H/O gestational diabetes mellitus, not currently pregnant    GESTATIONAL     Headache    hx of migraines, migraine w/parathesia on 04/04/2019, MRI normal   History of melanoma 07/14/2021   Face, 20s. Sees Dr. Terri Piedra annually   Prolapse of anterior vaginal wall 2023   SUI (stress urinary incontinence, female) 2023   Tinnitus aurium, bilateral    since 2018, off and on   Wears glasses    reading only   Past Surgical History:  Procedure Laterality Date   AUGMENTATION MAMMAPLASTY  2005   BLADDER SUSPENSION N/A 03/06/2022   Procedure: TRANSVAGINAL TAPE (TVT) PROCEDURE;  Surgeon: Marguerita Beards, MD;  Location: [facility];  Service: Gynecology;  Laterality: N/A;   BUNIONECTOMY Left 1997   CERVICAL BIOPSY  W/ LOOP ELECTRODE EXCISION  1999   CIN I   COLONOSCOPY  11/26/2020   normal   CYSTOCELE REPAIR N/A 03/06/2022   Procedure:  ANTERIOR REPAIR (CYSTOCELE);  Surgeon: Marguerita Beards, MD;  Location: [facility];  Service: Gynecology;  Laterality: N/A;   CYSTOSCOPY N/A 03/06/2022   Procedure: CYSTOSCOPY;  Surgeon: Marguerita Beards, MD;  Location: [facility];  Service: Gynecology;  Laterality: N/A;   ear drum surgery     at age 23   Patient Active Problem List   Diagnosis Date Noted   Primary stress urinary incontinence 07/14/2021   History of melanoma 07/14/2021   Gastroesophageal reflux disease 06/18/2019   Carpal tunnel syndrome, bilateral 06/18/2019   Essential hypertension 05/08/2019   Family history of premature CAD 11/20/2018   Allergic rhinitis 12/17/2012   Anxiety 06/27/2012    PCP: Willow Ora, MD  REFERRING PROVIDER: Willow Ora, MD  REFERRING DIAG: M54.16 (ICD-10-CM) - Left lumbar radiculopathy  Rationale for Evaluation and Treatment: Rehabilitation  THERAPY DIAG:  Other low back pain  Muscle weakness (generalized)  Chronic pain of left knee  ONSET DATE: Months  SUBJECTIVE:  SUBJECTIVE STATEMENT: Patient reports that she has had sciatica before but this pain is slightly different.  Reports her pain is in the top of her left glutes and low back and also on the top of her left kneecap.  Reports pain is worse with standing as well as at the end of the day in her knee.  Reports that the knee pain is her primary concern at this time.  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  Patient would like to reduce overall pain and improve overall function.   PERTINENT HISTORY:  Left knee pain Bladder suspension, left bunion surgery, HTN   PAIN:  Are you having pain? Yes: NPRS scale: 4 current, 10  at worse/10 Pain location: left top of knee Pain description: achy Aggravating factors: laying down  Relieving factors: moving   Are you having pain? Yes: NPRS scale: 8/10 Pain location: left butt Pain description: achy Aggravating factors: bending  Relieving factors: medication, lay on the right side PRECAUTIONS: None  RED FLAGS: None   WEIGHT BEARING RESTRICTIONS: No  FALLS:  Has patient fallen in last 6 months? Yes - trip     OCCUPATION: preschool - teacher  PLOF: Independent  PATIENT GOALS: To have less pain and to sleep better    OBJECTIVE:  Note: Objective measures were completed at Evaluation unless otherwise noted.  DIAGNOSTIC FINDINGS:  xray 10/15/23 Lumbar  IMPRESSION: 1. Minor degenerative disc disease at L2-L3 and L3-L4. Occasional facet hypertrophy. 2. Slight broad-based dextroscoliotic curvature of the lower thoracolumbar junction.  Left knee IMPRESSION: Slight medial tibiofemoral joint space narrowing.  PATIENT SURVEYS:   Patient-specific activity functional scoring scheme (Point to one number):  "0" represents "unable to perform." "10" represents "able to perform at prior level. 0 1 2 3 4 5 6 7 8 9  10 (Date and Score) Activity Initial  Activity Eval     sleep  0    Bending  6    Sitting criss cross applesauce 5    Additional Additional Total score = sum of the activity scores/number of activities Minimum detectable change (90%CI) for average score = 2 points Minimum detectable change (90%CI) for single activity score = 3 points PSFS developed by: Jake Seats., & Binkley, J. (1995). Assessing disability and change on individual  patients: a report of a patient specific measure. Physiotherapy [place], 47, 119-147. Reproduced with the permission of the authors  Score: 3.66= 11/3   COGNITION: Overall cognitive status: Within functional limits for tasks assessed     SENSATION: Not tested   POSTURE:  Hyperextends knees bilaterally, anterior pelvic tilt  PALPATION: Increased resting tone and tenderness to palpation along left quad, left glutes and piriformis  LUMBAR ROM:   AROM eval  Flexion 25% limited**  Extension 25% limited  Right lateral flexion   Left lateral flexion 25% limited*  Right rotation   Left rotation    (Blank rows = not tested)  * pain in left knee  ** pain in both knees    LE Measurements Lower Extremity Right EVAL Left EVAL   A/PROM MMT A/PROM MMT  Hip Flexion  4-*  4-*  Hip Extension  4 -  4 -  Hip Abduction      Hip Adduction      Hip Internal rotation [facility]  [facility]   Hip External rotation [facility]  [facility]   Knee Flexion WFL 4 [facility]  4  Knee Extension Excessive 5 4 Excessive 5 4*  Ankle Dorsiflexion  4+  4+  Ankle Plantarflexion      Ankle Inversion      Ankle Eversion       (Blank rows = not tested) * pain     TREATMENT DATE:                                                                                                                               11/05/2023  Therapeutic Exercise:  Aerobic: Supine: [name] stretch x 2 30 seconds left Prone:  Seated: Self mobilization with tiger tail to left quad tolerated well  Standing: Neuromuscular Re-education: Soft knee and not hyperextending knees upon standing 5 minutes, on sit bones education in sitting on sit bones in crisscross applesauce as well as sitting in chair 6 minutes Manual Therapy: Soft tissue mobilization with percussion gun as vibration to left thigh with towel for padding 5 minutes Therapeutic Activity: Self Care: Trigger Point Dry Needling:  Modalities:     PATIENT EDUCATION:  Education details: on current presentation, on HEP, on clinical outcomes score and POC, on benefits and risks of dry needling, on posture importance of not hyperextending knees Person educated: Patient Education method: Explanation, Demonstration, and Handouts Education comprehension: verbalized  understanding   HOME EXERCISE PROGRAM: [id number]  ASSESSMENT:  CLINICAL IMPRESSION: 11/05/2023  EVAL: Patient presents to physical therapy with complaints of left low back and left knee pain that started a while ago.  Patient with increased resting tone in left quadriceps and left glutes muscles.  Patient also demonstrates hyperextension bilateral knees with all standing postures and positions and presents with sacral sitting and seated posture.  Educated patient in current presentation as well as postural contributions to current pain presentation.  Patient would greatly benefit from skilled PT to address physical impairments and improve overall active postures to relieve stress on joints and improve overall quality of life.  OBJECTIVE IMPAIRMENTS: decreased activity tolerance, difficulty walking, decreased ROM, decreased strength, improper body mechanics, postural dysfunction, and pain.   ACTIVITY LIMITATIONS: standing, squatting, transfers, and locomotion level  PARTICIPATION LIMITATIONS: cleaning, community activity, and occupation  PERSONAL FACTORS: Fitness and Time since onset of injury/illness/exacerbation are also affecting patient's functional outcome.   REHAB POTENTIAL: Good  CLINICAL DECISION MAKING: Stable/uncomplicated  EVALUATION COMPLEXITY: Low   GOALS: Goals reviewed with patient? yes  SHORT TERM GOALS: Target date: 12/17/2023 Patient will be independent in self management strategies to improve quality of life and functional outcomes. Baseline: New Program Goal status: INITIAL  2.  Patient will report at least 50% improvement in overall symptoms and/or function to demonstrate improved functional mobility Baseline: 0% better Goal status: INITIAL  3.  Patient will report improved awareness of body position and reduce hyperextension of the knees while standing at work Baseline: Not currently Goal status: INITIAL      LONG TERM GOALS: Target date:  01/28/2024  Patient will  report at least 75% improvement in overall symptoms and/or function to demonstrate improved functional mobility Baseline: 0% better Goal status: INITIAL  2.  Patient will score at least 2 points higher on PSFS average to demonstrate change in overall function. Baseline: see above Goal status: INITIAL  3.  Patient will demonstrate pain-free manual muscle testing in lower extremities Baseline: Painful Goal status: INITIAL  4.  Patient will demonstrate reduced resting tone in left quad and left glutes Baseline: Increased resting tone Goal status: INITIAL   PLAN:  PT FREQUENCY: 1-2x/week for total of 16 visits over 12 weeks certification.  PT DURATION: 12 weeks PLANNED INTERVENTIONS: 97110-Therapeutic exercises, 97530- Therapeutic activity, [id number]- Neuromuscular re-education, [phone]- Self Care, 53664- Manual therapy, [phone]- Gait training, [phone]- Orthotic Fit/training, [phone]- Canalith repositioning, [id number]- Aquatic Therapy, [id number]- Electrical stimulation (unattended), [phone]- Ionotophoresis 4mg /ml Dexamethasone, Patient/Family education, Balance training, Stair training, Taping, Dry Needling, Joint mobilization, Joint manipulation, Spinal manipulation, Spinal mobilization, Cryotherapy, and Moist heat   PLAN FOR NEXT SESSION: Focus on posture reduced hyperextension in knees, reduce resting tone in quads and glutes, on dry needling and soft tissue work, stretches   4:10 PM, 11/05/23 Tereasa Coop, DPT Physical Therapy with Dolores Lory

## 2023-11-05 NOTE — Patient Instructions (Signed)

## 2023-11-16 ENCOUNTER — Other Ambulatory Visit: Payer: Self-pay | Admitting: Family Medicine

## 2023-11-20 NOTE — Therapy (Deleted)
 OUTPATIENT PHYSICAL THERAPY THORACOLUMBAR TREATMENT   Patient Name: [name] MRN: [id number] DOB:1970-11-01, 53 y.o., female Today's Date: 11/20/2023  END OF SESSION:    Past Medical History:  Diagnosis Date   Anemia    as a child and during pregnancy   Anxiety    CIN I (cervical intraepithelial neoplasia I)    1999-LEEP, normal Paps after    Depression    Essential hypertension 05/08/2019   Patient follows with Dr. Asencion Partridge, PCP. Theron Arista 07/14/21 as of 02/28/22.   Family history of premature CAD 11/20/2018   Mom   GERD (gastroesophageal reflux disease)    H/O gestational diabetes mellitus, not currently pregnant    GESTATIONAL     Headache    hx of migraines, migraine w/parathesia on 04/04/2019, MRI normal   History of melanoma 07/14/2021   Face, 20s. Sees Dr. Terri Piedra annually   Prolapse of anterior vaginal wall 2023   SUI (stress urinary incontinence, female) 2023   Tinnitus aurium, bilateral    since 2018, off and on   Wears glasses    reading only   Past Surgical History:  Procedure Laterality Date   AUGMENTATION MAMMAPLASTY  2005   BLADDER SUSPENSION N/A 03/06/2022   Procedure: TRANSVAGINAL TAPE (TVT) PROCEDURE;  Surgeon: Marguerita Beards, MD;  Location: [facility];  Service: Gynecology;  Laterality: N/A;   BUNIONECTOMY Left 1997   CERVICAL BIOPSY  W/ LOOP ELECTRODE EXCISION  1999   CIN I   COLONOSCOPY  11/26/2020   normal   CYSTOCELE REPAIR N/A 03/06/2022   Procedure: ANTERIOR REPAIR (CYSTOCELE);  Surgeon: Marguerita Beards, MD;  Location: [facility];  Service: Gynecology;  Laterality: N/A;   CYSTOSCOPY N/A 03/06/2022   Procedure: CYSTOSCOPY;  Surgeon: Marguerita Beards, MD;  Location: [facility];  Service: Gynecology;  Laterality: N/A;   ear drum surgery     at age 1   Patient Active Problem List   Diagnosis Date Noted   Primary stress urinary incontinence 07/14/2021   History of melanoma  07/14/2021   Gastroesophageal reflux disease 06/18/2019   Carpal tunnel syndrome, bilateral 06/18/2019   Essential hypertension 05/08/2019   Family history of premature CAD 11/20/2018   Allergic rhinitis 12/17/2012   Anxiety 06/27/2012    PCP: Willow Ora, MD  REFERRING PROVIDER: Willow Ora, MD  REFERRING DIAG: M54.16 (ICD-10-CM) - Left lumbar radiculopathy  Rationale for Evaluation and Treatment: Rehabilitation  THERAPY DIAG:  No diagnosis found.  ONSET DATE: Months  SUBJECTIVE:  SUBJECTIVE STATEMENT: 11/20/2023  EVAL: Patient reports that she has had sciatica before but this pain is slightly different.  Reports her pain is in the top of her left glutes and low back and also on the top of her left kneecap.  Reports pain is worse with standing as well as at the end of the day in her knee.  Reports that the knee pain is her primary concern at this time.  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  Patient would like to reduce overall pain and improve overall function.   PERTINENT HISTORY:  Left knee pain Bladder suspension, left bunion surgery, HTN   PAIN:  Are you having pain? Yes: NPRS scale: 4 current, 10 at worse/10 Pain location: left top of knee Pain description: achy Aggravating factors: laying down  Relieving factors: moving   Are you having pain? Yes: NPRS scale: 8/10 Pain location: left butt Pain description: achy Aggravating factors: bending  Relieving factors: medication, lay on the right side PRECAUTIONS: None  RED FLAGS: None   WEIGHT BEARING RESTRICTIONS: No  FALLS:  Has patient fallen in last 6 months? Yes - trip     OCCUPATION: preschool - teacher  PLOF: Independent  PATIENT  GOALS: To have less pain and to sleep better    OBJECTIVE:  Note: Objective measures were completed at Evaluation unless otherwise noted.  DIAGNOSTIC FINDINGS:  xray 10/15/23 Lumbar  IMPRESSION: 1. Minor degenerative disc disease at L2-L3 and L3-L4. Occasional facet hypertrophy. 2. Slight broad-based dextroscoliotic curvature of the lower thoracolumbar junction.  Left knee IMPRESSION: Slight medial tibiofemoral joint space narrowing.  PATIENT SURVEYS:   Patient-specific activity functional scoring scheme (Point to one number):  "0" represents "unable to perform." "10" represents "able to perform at prior level. 0 1 2 3 4 5 6 7 8 9  10 (Date and Score) Activity Initial  Activity Eval     sleep  0    Bending  6    Sitting criss cross applesauce 5    Additional Additional Total score = sum of the activity scores/number of activities Minimum detectable change (90%CI) for average score = 2 points Minimum detectable change (90%CI) for single activity score = 3 points PSFS developed by: Jake Seats., & Binkley, J. (1995). Assessing disability and change on individual  patients: a report of a patient specific measure. Physiotherapy [place], 47, 161-096. Reproduced with the permission of the authors  Score: 3.66= 11/3   COGNITION: Overall cognitive status: Within functional limits for tasks assessed     SENSATION: Not tested   POSTURE: Hyperextends knees bilaterally, anterior pelvic tilt  PALPATION: Increased resting tone and tenderness to palpation along left quad, left glutes and piriformis  LUMBAR ROM:   AROM eval  Flexion 25% limited**  Extension 25% limited  Right lateral flexion   Left lateral flexion 25% limited*  Right rotation   Left rotation    (Blank rows = not tested)  * pain in left knee  ** pain in both knees    LE Measurements Lower Extremity Right EVAL Left EVAL   A/PROM MMT A/PROM MMT  Hip Flexion  4-*  4-*  Hip  Extension  4 -  4 -  Hip Abduction      Hip Adduction      Hip Internal rotation [facility]  [facility]   Hip External rotation [facility]  [facility]   Knee Flexion  WFL 4 WFL 4  Knee Extension Excessive 5 4 Excessive 5 4*  Ankle Dorsiflexion  4+  4+  Ankle Plantarflexion      Ankle Inversion      Ankle Eversion       (Blank rows = not tested) * pain     TREATMENT DATE:                                                                                                                               11/20/2023  Therapeutic Exercise:  Aerobic: Supine: [name] stretch x 2 30 seconds left Prone:  Seated: Self mobilization with tiger tail to left quad tolerated well  Standing: Neuromuscular Re-education: Soft knee and not hyperextending knees upon standing 5 minutes, on sit bones education in sitting on sit bones in crisscross applesauce as well as sitting in chair 6 minutes Manual Therapy: Soft tissue mobilization with percussion gun as vibration to left thigh with towel for padding 5 minutes Therapeutic Activity: Self Care: Trigger Point Dry Needling:  Modalities:     PATIENT EDUCATION:  Education details: on current presentation, on HEP, on clinical outcomes score and POC, on benefits and risks of dry needling, on posture importance of not hyperextending knees Person educated: Patient Education method: Explanation, Demonstration, and Handouts Education comprehension: verbalized understanding   HOME EXERCISE PROGRAM: [id number]  ASSESSMENT:  CLINICAL IMPRESSION: 11/20/2023  EVAL: Patient presents to physical therapy with complaints of left low back and left knee pain that started a while ago.  Patient with increased resting tone in left quadriceps and left glutes muscles.  Patient also demonstrates hyperextension bilateral knees with all standing postures and positions and presents with sacral sitting and seated posture.  Educated patient in current presentation as well as postural contributions to current  pain presentation.  Patient would greatly benefit from skilled PT to address physical impairments and improve overall active postures to relieve stress on joints and improve overall quality of life.  OBJECTIVE IMPAIRMENTS: decreased activity tolerance, difficulty walking, decreased ROM, decreased strength, improper body mechanics, postural dysfunction, and pain.   ACTIVITY LIMITATIONS: standing, squatting, transfers, and locomotion level  PARTICIPATION LIMITATIONS: cleaning, community activity, and occupation  PERSONAL FACTORS: Fitness and Time since onset of injury/illness/exacerbation are also affecting patient's functional outcome.   REHAB POTENTIAL: Good  CLINICAL DECISION MAKING: Stable/uncomplicated  EVALUATION COMPLEXITY: Low   GOALS: Goals reviewed with patient? yes  SHORT TERM GOALS: Target date: 12/17/2023 Patient will be independent in self management strategies to improve quality of life and functional outcomes. Baseline: New Program Goal status: INITIAL  2.  Patient will report at least 50% improvement in overall symptoms and/or function to demonstrate improved functional mobility Baseline: 0% better Goal status: INITIAL  3.  Patient will report improved awareness of body position and reduce hyperextension of the knees while standing at work Baseline: Not currently Goal status: INITIAL      LONG TERM GOALS: Target date: 01/28/2024  Patient will report at least 75% improvement in overall symptoms and/or function to demonstrate improved functional mobility Baseline: 0% better Goal status: INITIAL  2.  Patient will score at least 2 points higher on PSFS average to demonstrate change in overall function. Baseline: see above Goal status: INITIAL  3.  Patient will demonstrate pain-free manual muscle testing in lower extremities Baseline: Painful Goal status: INITIAL  4.  Patient will demonstrate reduced resting tone in left quad and left glutes Baseline:  Increased resting tone Goal status: INITIAL   PLAN:  PT FREQUENCY: 1-2x/week for total of 16 visits over 12 weeks certification.  PT DURATION: 12 weeks PLANNED INTERVENTIONS: 97110-Therapeutic exercises, 97530- Therapeutic activity, [id number]- Neuromuscular re-education, [phone]- Self Care, 19147- Manual therapy, [phone]- Gait training, [phone]- Orthotic Fit/training, [phone]- Canalith repositioning, [id number]- Aquatic Therapy, 97014- Electrical stimulation (unattended), [phone]- Ionotophoresis 4mg /ml Dexamethasone, Patient/Family education, Balance training, Stair training, Taping, Dry Needling, Joint mobilization, Joint manipulation, Spinal manipulation, Spinal mobilization, Cryotherapy, and Moist heat   PLAN FOR NEXT SESSION: Focus on posture reduced hyperextension in knees, reduce resting tone in quads and glutes, on dry needling and soft tissue work, stretches   9:24 AM, 11/20/23 Tereasa Coop, DPT Physical Therapy with Dolores Lory

## 2023-11-21 ENCOUNTER — Encounter: Payer: 59 | Admitting: Physical Therapy

## 2023-11-26 ENCOUNTER — Encounter: Payer: Self-pay | Admitting: Physical Therapy

## 2023-11-26 ENCOUNTER — Ambulatory Visit: Payer: 59 | Admitting: Physical Therapy

## 2023-11-26 DIAGNOSIS — M25562 Pain in left knee: Secondary | ICD-10-CM

## 2023-11-26 DIAGNOSIS — G8929 Other chronic pain: Secondary | ICD-10-CM

## 2023-11-26 DIAGNOSIS — M5459 Other low back pain: Secondary | ICD-10-CM

## 2023-11-26 DIAGNOSIS — M6281 Muscle weakness (generalized): Secondary | ICD-10-CM

## 2023-11-26 NOTE — Therapy (Signed)
 OUTPATIENT PHYSICAL THERAPY THORACOLUMBAR TREATMENT   Patient Name: [name] MRN: [id number] DOB:[birth year], 53 y.o., female Today's Date: 11/26/2023  END OF SESSION:  PT End of Session - 11/26/23 1349     Visit Number 2    Number of Visits 16    Date for PT Re-Evaluation 01/28/24    Authorization Type VL 30 Jari Favre    Authorization - Visit Number 2    Authorization - Number of Visits 30    Progress Note Due on Visit 10    PT Start Time 1350    PT Stop Time 1428    PT Time Calculation (min) 38 min    Activity Tolerance Patient tolerated treatment well    Behavior During Therapy WFL for tasks assessed/performed              Past Medical History:  Diagnosis Date   Anemia    as a child and during pregnancy   Anxiety    CIN I (cervical intraepithelial neoplasia I)    1999-LEEP, normal Paps after    Depression    Essential hypertension 05/08/2019   Patient follows with Dr. Asencion Partridge, PCP. Theron Arista 07/14/21 as of 02/28/22.   Family history of premature CAD 11/20/2018   Mom   GERD (gastroesophageal reflux disease)    H/O gestational diabetes mellitus, not currently pregnant    GESTATIONAL     Headache    hx of migraines, migraine w/parathesia on 04/04/2019, MRI normal   History of melanoma 07/14/2021   Face, 20s. Sees Dr. Terri Piedra annually   Prolapse of anterior vaginal wall 2023   SUI (stress urinary incontinence, female) 2023   Tinnitus aurium, bilateral    since 2018, off and on   Wears glasses    reading only   Past Surgical History:  Procedure Laterality Date   AUGMENTATION MAMMAPLASTY  2005   BLADDER SUSPENSION N/A 03/06/2022   Procedure: TRANSVAGINAL TAPE (TVT) PROCEDURE;  Surgeon: Marguerita Beards, MD;  Location: [facility];  Service: Gynecology;  Laterality: N/A;   BUNIONECTOMY Left 1997   CERVICAL BIOPSY  W/ LOOP ELECTRODE EXCISION  1999   CIN I   COLONOSCOPY  11/26/2020   normal   CYSTOCELE REPAIR N/A 03/06/2022   Procedure:  ANTERIOR REPAIR (CYSTOCELE);  Surgeon: Marguerita Beards, MD;  Location: [facility];  Service: Gynecology;  Laterality: N/A;   CYSTOSCOPY N/A 03/06/2022   Procedure: CYSTOSCOPY;  Surgeon: Marguerita Beards, MD;  Location: [facility];  Service: Gynecology;  Laterality: N/A;   ear drum surgery     at age 10   Patient Active Problem List   Diagnosis Date Noted   Primary stress urinary incontinence 07/14/2021   History of melanoma 07/14/2021   Gastroesophageal reflux disease 06/18/2019   Carpal tunnel syndrome, bilateral 06/18/2019   Essential hypertension 05/08/2019   Family history of premature CAD 11/20/2018   Allergic rhinitis 12/17/2012   Anxiety 06/27/2012    PCP: Willow Ora, MD  REFERRING PROVIDER: Willow Ora, MD  REFERRING DIAG: M54.16 (ICD-10-CM) - Left lumbar radiculopathy  Rationale for Evaluation and Treatment: Rehabilitation  THERAPY DIAG:  Other low back pain  Muscle weakness (generalized)  Chronic pain of left knee  ONSET DATE: Months  SUBJECTIVE:  SUBJECTIVE STATEMENT: 11/26/2023 States she felt good and she has been doing her exercises. States that last couple days her pain has flared up right on top.   EVAL: Patient reports that she has had sciatica before but this pain is slightly different.  Reports her pain is in the top of her left glutes and low back and also on the top of her left kneecap.  Reports pain is worse with standing as well as at the end of the day in her knee.  Reports that the knee pain is her primary concern at this time.  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  Patient would like to reduce overall pain and improve overall  function.   PERTINENT HISTORY:  Left knee pain Bladder suspension, left bunion surgery, HTN   PAIN:  Are you having pain? Yes: NPRS scale: 7/10 Pain location: left top of knee Pain description: achy Aggravating factors: laying down  Relieving factors: moving    PRECAUTIONS: None  RED FLAGS: None   WEIGHT BEARING RESTRICTIONS: No  FALLS:  Has patient fallen in last 6 months? Yes - trip     OCCUPATION: preschool - teacher  PLOF: Independent  PATIENT GOALS: To have less pain and to sleep better    OBJECTIVE:  Note: Objective measures were completed at Evaluation unless otherwise noted.  DIAGNOSTIC FINDINGS:  xray 10/15/23 Lumbar  IMPRESSION: 1. Minor degenerative disc disease at L2-L3 and L3-L4. Occasional facet hypertrophy. 2. Slight broad-based dextroscoliotic curvature of the lower thoracolumbar junction.  Left knee IMPRESSION: Slight medial tibiofemoral joint space narrowing.  PATIENT SURVEYS:   Patient-specific activity functional scoring scheme (Point to one number):  "0" represents "unable to perform." "10" represents "able to perform at prior level. 0 1 2 3 4 5 6 7 8 9  10 (Date and Score) Activity Initial  Activity Eval     sleep  0    Bending  6    Sitting criss cross applesauce 5    Additional Additional Total score = sum of the activity scores/number of activities Minimum detectable change (90%CI) for average score = 2 points Minimum detectable change (90%CI) for single activity score = 3 points PSFS developed by: Jake Seats., & Binkley, J. (1995). Assessing disability and change on individual  patients: a report of a patient specific measure. Physiotherapy [place], 47, [phone]. Reproduced with the permission of the authors  Score: 3.66= 11/3   COGNITION: Overall cognitive status: Within functional limits for tasks assessed     SENSATION: Not tested   POSTURE: Hyperextends knees bilaterally, anterior  pelvic tilt  PALPATION: Increased resting tone and tenderness to palpation along left quad, left glutes and piriformis  LUMBAR ROM:   AROM eval  Flexion 25% limited**  Extension 25% limited  Right lateral flexion   Left lateral flexion 25% limited*  Right rotation   Left rotation    (Blank rows = not tested)  * pain in left knee  ** pain in both knees    LE Measurements Lower Extremity Right EVAL Left EVAL   A/PROM MMT A/PROM MMT  Hip Flexion  4-*  4-*  Hip Extension  4 -  4 -  Hip Abduction      Hip Adduction      Hip Internal rotation [facility]  [facility]   Hip External rotation [facility]  [facility]   Knee Flexion WFL 4 WFL 4  Knee Extension Excessive 5 4 Excessive 5 4*  Ankle Dorsiflexion  4+  4+  Ankle Plantarflexion      Ankle Inversion      Ankle Eversion       (Blank rows = not tested) * pain     TREATMENT DATE:                                                                                                                               11/26/2023  Therapeutic Exercise:  Aerobic: Supine: bridges x15 5" holds Prone: reverse nordic curls 3x5 5" holds  Seated: LAQS X5 10" HOLDs B, patella mobilization 5 minutes L , calf raise focus on isolated motion 5 minutes total L  Standing: Neuromuscular Re-education:  Manual Therapy: Soft tissue mobilization with percussion gun as vibration to left thigh with towel for padding 8 minutes Therapeutic Activity: Self Care: Trigger Point Dry Needling:  Modalities:     PATIENT EDUCATION:  Education details: on HEP, on general nutrition advice, food for anti-inflammation (tumeric) - avoiding/limiting dairy/wheat/soy/corn (limit one at a time)- talking to MD prior to adding any supplements Person educated: Patient Education method: Explanation, Demonstration, and Handouts Education comprehension: verbalized understanding   HOME EXERCISE PROGRAM: [id number]  ASSESSMENT:  CLINICAL IMPRESSION: 11/26/2023 Tolerated manual interventions  well. Added lengthening and strengthening exercises to HEP. All exercises were tolerated well. Reduced pain 4/10 noted end of session with fatigue in muscles. Reviewed HEP and answered all questions. Will continue with current POC as tolerated.    EVAL: Patient presents to physical therapy with complaints of left low back and left knee pain that started a while ago.  Patient with increased resting tone in left quadriceps and left glutes muscles.  Patient also demonstrates hyperextension bilateral knees with all standing postures and positions and presents with sacral sitting and seated posture.  Educated patient in current presentation as well as postural contributions to current pain presentation.  Patient would greatly benefit from skilled PT to address physical impairments and improve overall active postures to relieve stress on joints and improve overall quality of life.  OBJECTIVE IMPAIRMENTS: decreased activity tolerance, difficulty walking, decreased ROM, decreased strength, improper body mechanics, postural dysfunction, and pain.   ACTIVITY LIMITATIONS: standing, squatting, transfers, and locomotion level  PARTICIPATION LIMITATIONS: cleaning, community activity, and occupation  PERSONAL FACTORS: Fitness and Time since onset of injury/illness/exacerbation are also affecting patient's functional outcome.   REHAB POTENTIAL: Good  CLINICAL DECISION MAKING: Stable/uncomplicated  EVALUATION COMPLEXITY: Low   GOALS: Goals reviewed with patient? yes  SHORT TERM GOALS: Target date: 12/17/2023 Patient will be independent in self management strategies to improve quality of life and functional outcomes. Baseline: New Program Goal status: INITIAL  2.  Patient will report at least 50% improvement in overall symptoms and/or function to demonstrate improved functional mobility Baseline: 0% better Goal status: INITIAL  3.  Patient will report improved awareness of body position and reduce  hyperextension of  the knees while standing at work Baseline: Not currently Goal status: INITIAL      LONG TERM GOALS: Target date: 01/28/2024  Patient will report at least 75% improvement in overall symptoms and/or function to demonstrate improved functional mobility Baseline: 0% better Goal status: INITIAL  2.  Patient will score at least 2 points higher on PSFS average to demonstrate change in overall function. Baseline: see above Goal status: INITIAL  3.  Patient will demonstrate pain-free manual muscle testing in lower extremities Baseline: Painful Goal status: INITIAL  4.  Patient will demonstrate reduced resting tone in left quad and left glutes Baseline: Increased resting tone Goal status: INITIAL   PLAN:  PT FREQUENCY: 1-2x/week for total of 16 visits over 12 weeks certification.  PT DURATION: 12 weeks PLANNED INTERVENTIONS: 97110-Therapeutic exercises, 97530- Therapeutic activity, [id number]- Neuromuscular re-education, [phone]- Self Care, 60454- Manual therapy, [phone]- Gait training, [phone]- Orthotic Fit/training, [phone]- Canalith repositioning, [id number]- Aquatic Therapy, 97014- Electrical stimulation (unattended), [phone]- Ionotophoresis 4mg /ml Dexamethasone, Patient/Family education, Balance training, Stair training, Taping, Dry Needling, Joint mobilization, Joint manipulation, Spinal manipulation, Spinal mobilization, Cryotherapy, and Moist heat   PLAN FOR NEXT SESSION: Focus on posture reduced hyperextension in knees, reduce resting tone in quads and glutes, on dry needling and soft tissue work, stretches   2:33 PM, 11/26/23 Tereasa Coop, DPT Physical Therapy with Dolores Lory

## 2023-11-28 ENCOUNTER — Encounter: Payer: 59 | Admitting: Physical Therapy

## 2023-12-03 ENCOUNTER — Ambulatory Visit: Payer: 59 | Admitting: Physical Therapy

## 2023-12-03 ENCOUNTER — Encounter: Payer: Self-pay | Admitting: Physical Therapy

## 2023-12-03 DIAGNOSIS — M6281 Muscle weakness (generalized): Secondary | ICD-10-CM

## 2023-12-03 DIAGNOSIS — M5459 Other low back pain: Secondary | ICD-10-CM | POA: Diagnosis not present

## 2023-12-03 DIAGNOSIS — G8929 Other chronic pain: Secondary | ICD-10-CM

## 2023-12-03 DIAGNOSIS — M25562 Pain in left knee: Secondary | ICD-10-CM | POA: Diagnosis not present

## 2023-12-03 NOTE — Therapy (Signed)
 OUTPATIENT PHYSICAL THERAPY THORACOLUMBAR TREATMENT   Patient Name: [name] MRN: [id number] DOB:Jan 19, 1971, 53 y.o., female Today's Date: 12/03/2023  END OF SESSION:  PT End of Session - 12/03/23 1347     Visit Number 3    Number of Visits 16    Date for PT Re-Evaluation 01/28/24    Authorization Type VL 30 Jari Favre    Authorization - Visit Number 3    Authorization - Number of Visits 30    Progress Note Due on Visit 10    PT Start Time 1347    PT Stop Time 1425    PT Time Calculation (min) 38 min    Activity Tolerance Patient tolerated treatment well    Behavior During Therapy WFL for tasks assessed/performed              Past Medical History:  Diagnosis Date   Anemia    as a child and during pregnancy   Anxiety    CIN I (cervical intraepithelial neoplasia I)    1999-LEEP, normal Paps after    Depression    Essential hypertension 05/08/2019   Patient follows with Dr. Asencion Partridge, PCP. Theron Arista 07/14/21 as of 02/28/22.   Family history of premature CAD 11/20/2018   Mom   GERD (gastroesophageal reflux disease)    H/O gestational diabetes mellitus, not currently pregnant    GESTATIONAL     Headache    hx of migraines, migraine w/parathesia on 04/04/2019, MRI normal   History of melanoma 07/14/2021   Face, 20s. Sees Dr. Terri Piedra annually   Prolapse of anterior vaginal wall 2023   SUI (stress urinary incontinence, female) 2023   Tinnitus aurium, bilateral    since 2018, off and on   Wears glasses    reading only   Past Surgical History:  Procedure Laterality Date   AUGMENTATION MAMMAPLASTY  2005   BLADDER SUSPENSION N/A 03/06/2022   Procedure: TRANSVAGINAL TAPE (TVT) PROCEDURE;  Surgeon: Marguerita Beards, MD;  Location: [facility];  Service: Gynecology;  Laterality: N/A;   BUNIONECTOMY Left 1997   CERVICAL BIOPSY  W/ LOOP ELECTRODE EXCISION  1999   CIN I   COLONOSCOPY  11/26/2020   normal   CYSTOCELE REPAIR N/A 03/06/2022   Procedure:  ANTERIOR REPAIR (CYSTOCELE);  Surgeon: Marguerita Beards, MD;  Location: [facility];  Service: Gynecology;  Laterality: N/A;   CYSTOSCOPY N/A 03/06/2022   Procedure: CYSTOSCOPY;  Surgeon: Marguerita Beards, MD;  Location: [facility];  Service: Gynecology;  Laterality: N/A;   ear drum surgery     at age 38   Patient Active Problem List   Diagnosis Date Noted   Primary stress urinary incontinence 07/14/2021   History of melanoma 07/14/2021   Gastroesophageal reflux disease 06/18/2019   Carpal tunnel syndrome, bilateral 06/18/2019   Essential hypertension 05/08/2019   Family history of premature CAD 11/20/2018   Allergic rhinitis 12/17/2012   Anxiety 06/27/2012    PCP: Willow Ora, MD  REFERRING PROVIDER: Willow Ora, MD  REFERRING DIAG: M54.16 (ICD-10-CM) - Left lumbar radiculopathy  Rationale for Evaluation and Treatment: Rehabilitation  THERAPY DIAG:  Other low back pain  Muscle weakness (generalized)  Chronic pain of left knee  ONSET DATE: Months  SUBJECTIVE:  SUBJECTIVE STATEMENT: 12/03/2023 States she felt good after last session. States she did a lot of walking and was hurting. States that that night she was in a lot of pain. States she was so busy she could barely do her exercises. States yesterday was better.  EVAL: Patient reports that she has had sciatica before but this pain is slightly different.  Reports her pain is in the top of her left glutes and low back and also on the top of her left kneecap.  Reports pain is worse with standing as well as at the end of the day in her knee.  Reports that the knee pain is her primary concern at this time.  Reports it is also difficult for her to sit in crisscross applesauce which is important as she  works at a preschool.  Patient would like to reduce overall pain and improve overall function.   PERTINENT HISTORY:  Left knee pain Bladder suspension, left bunion surgery, HTN   PAIN:  Are you having pain? Yes: NPRS scale: 0/10 Pain location: left top of knee Pain description: achy Aggravating factors: laying down  Relieving factors: moving    PRECAUTIONS: None  RED FLAGS: None   WEIGHT BEARING RESTRICTIONS: No  FALLS:  Has patient fallen in last 6 months? Yes - trip     OCCUPATION: preschool - teacher  PLOF: Independent  PATIENT GOALS: To have less pain and to sleep better    OBJECTIVE:  Note: Objective measures were completed at Evaluation unless otherwise noted.  DIAGNOSTIC FINDINGS:  xray 10/15/23 Lumbar  IMPRESSION: 1. Minor degenerative disc disease at L2-L3 and L3-L4. Occasional facet hypertrophy. 2. Slight broad-based dextroscoliotic curvature of the lower thoracolumbar junction.  Left knee IMPRESSION: Slight medial tibiofemoral joint space narrowing.  PATIENT SURVEYS:   Patient-specific activity functional scoring scheme (Point to one number):  "0" represents "unable to perform." "10" represents "able to perform at prior level. 0 1 2 3 4 5 6 7 8 9  10 (Date and Score) Activity Initial  Activity Eval     sleep  0    Bending  6    Sitting criss cross applesauce 5    Additional Additional Total score = sum of the activity scores/number of activities Minimum detectable change (90%CI) for average score = 2 points Minimum detectable change (90%CI) for single activity score = 3 points PSFS developed by: Jake Seats., & Binkley, J. (1995). Assessing disability and change on individual  patients: a report of a patient specific measure. Physiotherapy [place], 47, [phone]. Reproduced with the permission of the authors  Score: 3.66= 11/3   COGNITION: Overall cognitive status: Within functional limits for tasks  assessed     SENSATION: Not tested   POSTURE: Hyperextends knees bilaterally, anterior pelvic tilt  PALPATION: Increased resting tone and tenderness to palpation along left quad, left glutes and piriformis  LUMBAR ROM:   AROM eval  Flexion 25% limited**  Extension 25% limited  Right lateral flexion   Left lateral flexion 25% limited*  Right rotation   Left rotation    (Blank rows = not tested)  * pain in left knee  ** pain in both knees    LE Measurements Lower Extremity Right EVAL Left EVAL   A/PROM MMT A/PROM MMT  Hip Flexion  4-*  4-*  Hip Extension  4 -  4 -  Hip Abduction      Hip Adduction      Hip Internal rotation [facility]  WFL   Hip External rotation [facility]  WFL   Knee Flexion WFL 4 WFL 4  Knee Extension Excessive 5 4 Excessive 5 4*  Ankle Dorsiflexion  4+  4+  Ankle Plantarflexion      Ankle Inversion      Ankle Eversion       (Blank rows = not tested) * pain     TREATMENT DATE:                                                                                                                               12/03/2023  Therapeutic Exercise:  Review of HEP: Supine: bridges x15 5" holds Prone: BELLY breathing over 2 pillows 5 minutes  Seated:  Neuromuscular Re-education: long exhale breathing in supine - 8 minutes, posterior lower rib breathing with and without scapular protraction and tacite cues - 7 minutes Manual Therapy: Soft tissue mobilization with percussion gun as vibration to left thigh with towel for padding and to left glutes and lumbar region 15 minutes Therapeutic Activity: Self Care: Trigger Point Dry Needling:  Modalities:     PATIENT EDUCATION:  Education details: on HEP , on anatomy and breathing mechanics  Person educated: Patient Education method: Explanation, Demonstration, and Handouts Education comprehension: verbalized understanding   HOME EXERCISE PROGRAM: [id number] Long exhale breathing Posterior lower rib breathing    ASSESSMENT:  CLINICAL IMPRESSION: 12/03/2023 Focused on manual interventions and assessment of low back. Tolerated manual interventions and breathing exercises well with reduced left quad muscle muscle tension. Added new exercises to HEP. Overall patient doing well in sessions, encrouraged focus on breathing exercises between now and next session.   EVAL: Patient presents to physical therapy with complaints of left low back and left knee pain that started a while ago.  Patient with increased resting tone in left quadriceps and left glutes muscles.  Patient also demonstrates hyperextension bilateral knees with all standing postures and positions and presents with sacral sitting and seated posture.  Educated patient in current presentation as well as postural contributions to current pain presentation.  Patient would greatly benefit from skilled PT to address physical impairments and improve overall active postures to relieve stress on joints and improve overall quality of life.  OBJECTIVE IMPAIRMENTS: decreased activity tolerance, difficulty walking, decreased ROM, decreased strength, improper body mechanics, postural dysfunction, and pain.   ACTIVITY LIMITATIONS: standing, squatting, transfers, and locomotion level  PARTICIPATION LIMITATIONS: cleaning, community activity, and occupation  PERSONAL FACTORS: Fitness and Time since onset of injury/illness/exacerbation are also affecting patient's functional outcome.   REHAB POTENTIAL: Good  CLINICAL DECISION MAKING: Stable/uncomplicated  EVALUATION COMPLEXITY: Low   GOALS: Goals reviewed with patient? yes  SHORT TERM GOALS: Target date: 12/17/2023 Patient will be independent in self management strategies to improve quality of life and functional outcomes. Baseline: New Program Goal status: INITIAL  2.  Patient will report at least 50% improvement in overall symptoms and/or function to demonstrate improved functional mobility  Baseline:  0% better Goal status: INITIAL  3.  Patient will report improved awareness of body position and reduce hyperextension of the knees while standing at work Baseline: Not currently Goal status: INITIAL      LONG TERM GOALS: Target date: 01/28/2024  Patient will report at least 75% improvement in overall symptoms and/or function to demonstrate improved functional mobility Baseline: 0% better Goal status: INITIAL  2.  Patient will score at least 2 points higher on PSFS average to demonstrate change in overall function. Baseline: see above Goal status: INITIAL  3.  Patient will demonstrate pain-free manual muscle testing in lower extremities Baseline: Painful Goal status: INITIAL  4.  Patient will demonstrate reduced resting tone in left quad and left glutes Baseline: Increased resting tone Goal status: INITIAL   PLAN:  PT FREQUENCY: 1-2x/week for total of 16 visits over 12 weeks certification.  PT DURATION: 12 weeks PLANNED INTERVENTIONS: 97110-Therapeutic exercises, 97530- Therapeutic activity, [id number]- Neuromuscular re-education, [phone]- Self Care, 60454- Manual therapy, [phone]- Gait training, [phone]- Orthotic Fit/training, [phone]- Canalith repositioning, [id number]- Aquatic Therapy, 97014- Electrical stimulation (unattended), [phone]- Ionotophoresis 4mg /ml Dexamethasone, Patient/Family education, Balance training, Stair training, Taping, Dry Needling, Joint mobilization, Joint manipulation, Spinal manipulation, Spinal mobilization, Cryotherapy, and Moist heat   PLAN FOR NEXT SESSION: Focus on posture reduced hyperextension in knees, reduce resting tone in quads and glutes, on dry needling and soft tissue work, stretches   2:26 PM, 12/03/23 Tereasa Coop, DPT Physical Therapy with Dolores Lory

## 2023-12-05 ENCOUNTER — Encounter: Payer: Self-pay | Admitting: Physical Therapy

## 2023-12-05 ENCOUNTER — Ambulatory Visit: Payer: 59 | Admitting: Physical Therapy

## 2023-12-05 DIAGNOSIS — M6281 Muscle weakness (generalized): Secondary | ICD-10-CM | POA: Diagnosis not present

## 2023-12-05 DIAGNOSIS — G8929 Other chronic pain: Secondary | ICD-10-CM | POA: Diagnosis not present

## 2023-12-05 DIAGNOSIS — M5459 Other low back pain: Secondary | ICD-10-CM | POA: Diagnosis not present

## 2023-12-05 DIAGNOSIS — M25562 Pain in left knee: Secondary | ICD-10-CM

## 2023-12-05 NOTE — Therapy (Signed)
 OUTPATIENT PHYSICAL THERAPY THORACOLUMBAR TREATMENT   Patient Name: [name] MRN: [id number] DOB:03/14/1971, 53 y.o., female Today's Date: 12/05/2023  END OF SESSION:  PT End of Session - 12/05/23 1347     Visit Number 4    Number of Visits 16    Date for PT Re-Evaluation 01/28/24    Authorization Type VL 30 Jari Favre    Authorization - Visit Number 4    Authorization - Number of Visits 30    Progress Note Due on Visit 10    PT Start Time 1347    PT Stop Time 1427    PT Time Calculation (min) 40 min    Activity Tolerance Patient tolerated treatment well    Behavior During Therapy WFL for tasks assessed/performed              Past Medical History:  Diagnosis Date   Anemia    as a child and during pregnancy   Anxiety    CIN I (cervical intraepithelial neoplasia I)    1999-LEEP, normal Paps after    Depression    Essential hypertension 05/08/2019   Patient follows with Dr. Asencion Partridge, PCP. Theron Arista 07/14/21 as of 02/28/22.   Family history of premature CAD 11/20/2018   Mom   GERD (gastroesophageal reflux disease)    H/O gestational diabetes mellitus, not currently pregnant    GESTATIONAL     Headache    hx of migraines, migraine w/parathesia on 04/04/2019, MRI normal   History of melanoma 07/14/2021   Face, 20s. Sees Dr. Terri Piedra annually   Prolapse of anterior vaginal wall 2023   SUI (stress urinary incontinence, female) 2023   Tinnitus aurium, bilateral    since 2018, off and on   Wears glasses    reading only   Past Surgical History:  Procedure Laterality Date   AUGMENTATION MAMMAPLASTY  2005   BLADDER SUSPENSION N/A 03/06/2022   Procedure: TRANSVAGINAL TAPE (TVT) PROCEDURE;  Surgeon: Marguerita Beards, MD;  Location: [facility];  Service: Gynecology;  Laterality: N/A;   BUNIONECTOMY Left 1997   CERVICAL BIOPSY  W/ LOOP ELECTRODE EXCISION  1999   CIN I   COLONOSCOPY  11/26/2020   normal   CYSTOCELE REPAIR N/A 03/06/2022   Procedure:  ANTERIOR REPAIR (CYSTOCELE);  Surgeon: Marguerita Beards, MD;  Location: [facility];  Service: Gynecology;  Laterality: N/A;   CYSTOSCOPY N/A 03/06/2022   Procedure: CYSTOSCOPY;  Surgeon: Marguerita Beards, MD;  Location: [facility];  Service: Gynecology;  Laterality: N/A;   ear drum surgery     at age 42   Patient Active Problem List   Diagnosis Date Noted   Primary stress urinary incontinence 07/14/2021   History of melanoma 07/14/2021   Gastroesophageal reflux disease 06/18/2019   Carpal tunnel syndrome, bilateral 06/18/2019   Essential hypertension 05/08/2019   Family history of premature CAD 11/20/2018   Allergic rhinitis 12/17/2012   Anxiety 06/27/2012    PCP: Willow Ora, MD  REFERRING PROVIDER: Willow Ora, MD  REFERRING DIAG: M54.16 (ICD-10-CM) - Left lumbar radiculopathy  Rationale for Evaluation and Treatment: Rehabilitation  THERAPY DIAG:  Other low back pain  Muscle weakness (generalized)  Chronic pain of left knee  ONSET DATE: Months  SUBJECTIVE:  SUBJECTIVE STATEMENT: 12/05/2023 States she felt good after last session and it felt better since then. States that last night she rode in a low sedan and she was having a bit more pain in her back and knee. States she hasn't taken motrin for that last couple days.   EVAL: Patient reports that she has had sciatica before but this pain is slightly different.  Reports her pain is in the top of her left glutes and low back and also on the top of her left kneecap.  Reports pain is worse with standing as well as at the end of the day in her knee.  Reports that the knee pain is her primary concern at this time.  Reports it is also difficult for her to sit in crisscross applesauce which is important  as she works at a preschool.  Patient would like to reduce overall pain and improve overall function.   PERTINENT HISTORY:  Left knee pain Bladder suspension, left bunion surgery, HTN   PAIN:  Are you having pain? Yes: NPRS scale: 2/10 Pain location: side of the knee and back  Pain description: pulling Aggravating factors: laying down  Relieving factors: moving    PRECAUTIONS: None  RED FLAGS: None   WEIGHT BEARING RESTRICTIONS: No  FALLS:  Has patient fallen in last 6 months? Yes - trip     OCCUPATION: preschool - teacher  PLOF: Independent  PATIENT GOALS: To have less pain and to sleep better    OBJECTIVE:  Note: Objective measures were completed at Evaluation unless otherwise noted.  DIAGNOSTIC FINDINGS:  xray 10/15/23 Lumbar  IMPRESSION: 1. Minor degenerative disc disease at L2-L3 and L3-L4. Occasional facet hypertrophy. 2. Slight broad-based dextroscoliotic curvature of the lower thoracolumbar junction.  Left knee IMPRESSION: Slight medial tibiofemoral joint space narrowing.  PATIENT SURVEYS:   Patient-specific activity functional scoring scheme (Point to one number):  "0" represents "unable to perform." "10" represents "able to perform at prior level. 0 1 2 3 4 5 6 7 8 9  10 (Date and Score) Activity Initial  Activity Eval     sleep  0    Bending  6    Sitting criss cross applesauce 5    Additional Additional Total score = sum of the activity scores/number of activities Minimum detectable change (90%CI) for average score = 2 points Minimum detectable change (90%CI) for single activity score = 3 points PSFS developed by: Jake Seats., & Binkley, J. (1995). Assessing disability and change on individual  patients: a report of a patient specific measure. Physiotherapy [place], 47, [phone]. Reproduced with the permission of the authors  Score: 3.66= 11/3   COGNITION: Overall cognitive status: Within functional limits  for tasks assessed     SENSATION: Not tested   POSTURE: Hyperextends knees bilaterally, anterior pelvic tilt  PALPATION: Increased resting tone and tenderness to palpation along left quad, left glutes and piriformis  LUMBAR ROM:   AROM eval  Flexion 25% limited**  Extension 25% limited  Right lateral flexion   Left lateral flexion 25% limited*  Right rotation   Left rotation    (Blank rows = not tested)  * pain in left knee  ** pain in both knees    LE Measurements Lower Extremity Right EVAL Left EVAL   A/PROM MMT A/PROM MMT  Hip Flexion  4-*  4-*  Hip Extension  4 -  4 -  Hip Abduction      Hip Adduction  Hip Internal rotation [facility]  [facility]   Hip External rotation [facility]  WFL   Knee Flexion WFL 4 WFL 4  Knee Extension Excessive 5 4 Excessive 5 4*  Ankle Dorsiflexion  4+  4+  Ankle Plantarflexion      Ankle Inversion      Ankle Eversion       (Blank rows = not tested) * pain     TREATMENT DATE:                                                                                                                               12/05/2023  Therapeutic Exercise:  Review of HEP: Supine: piriformis stretch x3 30" holds B IR and ER  SL: CLAMSHELL  RIGHT  difficult to activate correct muscles tolerated reverse clam better x15, LEFT clamshell and reverse clamshell x10 Each Prone: BELLY breathing over 2 pillows 5 minutes  Seated: lumbar flexion stretch x10 10" holds, piriformis stretch x1 30" holds B Neuromuscular Re-education:  Manual Therapy: Soft tissue mobilization with and without percussion gun as vibration to left glutes and low back with towel for padding 15 minutes Therapeutic Activity: Self Care: Trigger Point Dry Needling:  Modalities:     PATIENT EDUCATION:  Education details: on HEP   Person educated: Patient Education method: Programmer, multimedia, Facilities manager, and Handouts Education comprehension: verbalized understanding   HOME EXERCISE  PROGRAM: [id number] Long exhale breathing Posterior lower rib breathing   ASSESSMENT:  CLINICAL IMPRESSION: 12/05/2023 Focused on manual interventions of low back and left hip. Tolerated this well with reduced pain and muscle tension in left LE afterwards. Trailed clamshell but difficult time activating correct muscles with regular clamshell on right side. Able to perform on left side with better form/muscle activation. Added reverse clamshell to HEP as well as piriformis stretch. No increase in symptoms noted end of session.  Will continue with current POC as tolerated.    EVAL: Patient presents to physical therapy with complaints of left low back and left knee pain that started a while ago.  Patient with increased resting tone in left quadriceps and left glutes muscles.  Patient also demonstrates hyperextension bilateral knees with all standing postures and positions and presents with sacral sitting and seated posture.  Educated patient in current presentation as well as postural contributions to current pain presentation.  Patient would greatly benefit from skilled PT to address physical impairments and improve overall active postures to relieve stress on joints and improve overall quality of life.  OBJECTIVE IMPAIRMENTS: decreased activity tolerance, difficulty walking, decreased ROM, decreased strength, improper body mechanics, postural dysfunction, and pain.   ACTIVITY LIMITATIONS: standing, squatting, transfers, and locomotion level  PARTICIPATION LIMITATIONS: cleaning, community activity, and occupation  PERSONAL FACTORS: Fitness and Time since onset of injury/illness/exacerbation are also affecting patient's functional outcome.   REHAB POTENTIAL: Good  CLINICAL DECISION MAKING: Stable/uncomplicated  EVALUATION COMPLEXITY: Low   GOALS: Goals reviewed with patient? yes  SHORT TERM GOALS:  Target date: 12/17/2023 Patient will be independent in self management strategies to improve  quality of life and functional outcomes. Baseline: New Program Goal status: INITIAL  2.  Patient will report at least 50% improvement in overall symptoms and/or function to demonstrate improved functional mobility Baseline: 0% better Goal status: INITIAL  3.  Patient will report improved awareness of body position and reduce hyperextension of the knees while standing at work Baseline: Not currently Goal status: INITIAL      LONG TERM GOALS: Target date: 01/28/2024  Patient will report at least 75% improvement in overall symptoms and/or function to demonstrate improved functional mobility Baseline: 0% better Goal status: INITIAL  2.  Patient will score at least 2 points higher on PSFS average to demonstrate change in overall function. Baseline: see above Goal status: INITIAL  3.  Patient will demonstrate pain-free manual muscle testing in lower extremities Baseline: Painful Goal status: INITIAL  4.  Patient will demonstrate reduced resting tone in left quad and left glutes Baseline: Increased resting tone Goal status: INITIAL   PLAN:  PT FREQUENCY: 1-2x/week for total of 16 visits over 12 weeks certification.  PT DURATION: 12 weeks PLANNED INTERVENTIONS: 97110-Therapeutic exercises, 97530- Therapeutic activity, [id number]- Neuromuscular re-education, [phone]- Self Care, 57846- Manual therapy, [phone]- Gait training, [phone]- Orthotic Fit/training, [phone]- Canalith repositioning, [id number]- Aquatic Therapy, 97014- Electrical stimulation (unattended), [phone]- Ionotophoresis 4mg /ml Dexamethasone, Patient/Family education, Balance training, Stair training, Taping, Dry Needling, Joint mobilization, Joint manipulation, Spinal manipulation, Spinal mobilization, Cryotherapy, and Moist heat   PLAN FOR NEXT SESSION: HIP ER ACTIVATION - BILATERALLY   Focus on posture reduced hyperextension in knees, reduce resting tone in quads and glutes, on dry needling and soft tissue work, stretches   2:28 PM,  12/05/23 Tereasa Coop, DPT Physical Therapy with Dolores Lory

## 2023-12-10 ENCOUNTER — Ambulatory Visit: Payer: 59 | Admitting: Physical Therapy

## 2023-12-10 ENCOUNTER — Encounter: Payer: Self-pay | Admitting: Physical Therapy

## 2023-12-10 DIAGNOSIS — M6281 Muscle weakness (generalized): Secondary | ICD-10-CM | POA: Diagnosis not present

## 2023-12-10 DIAGNOSIS — M25562 Pain in left knee: Secondary | ICD-10-CM | POA: Diagnosis not present

## 2023-12-10 DIAGNOSIS — G8929 Other chronic pain: Secondary | ICD-10-CM | POA: Diagnosis not present

## 2023-12-10 DIAGNOSIS — M5459 Other low back pain: Secondary | ICD-10-CM | POA: Diagnosis not present

## 2023-12-10 NOTE — Therapy (Signed)
 OUTPATIENT PHYSICAL THERAPY THORACOLUMBAR TREATMENT   Patient Name: [name] MRN: [id number] DOB:Nov 20, 1970, 53 y.o., female Today's Date: 12/10/2023  END OF SESSION:  PT End of Session - 12/10/23 1353     Visit Number 5    Number of Visits 16    Date for PT Re-Evaluation 01/28/24    Authorization Type VL 30 Jari Favre    Authorization - Visit Number 5    Authorization - Number of Visits 30    Progress Note Due on Visit 10    PT Start Time 1347    PT Stop Time 1427    PT Time Calculation (min) 40 min    Activity Tolerance Patient tolerated treatment well    Behavior During Therapy WFL for tasks assessed/performed               Past Medical History:  Diagnosis Date   Anemia    as a child and during pregnancy   Anxiety    CIN I (cervical intraepithelial neoplasia I)    1999-LEEP, normal Paps after    Depression    Essential hypertension 05/08/2019   Patient follows with Dr. Asencion Partridge, PCP. Theron Arista 07/14/21 as of 02/28/22.   Family history of premature CAD 11/20/2018   Mom   GERD (gastroesophageal reflux disease)    H/O gestational diabetes mellitus, not currently pregnant    GESTATIONAL     Headache    hx of migraines, migraine w/parathesia on 04/04/2019, MRI normal   History of melanoma 07/14/2021   Face, 20s. Sees Dr. Terri Piedra annually   Prolapse of anterior vaginal wall 2023   SUI (stress urinary incontinence, female) 2023   Tinnitus aurium, bilateral    since 2018, off and on   Wears glasses    reading only   Past Surgical History:  Procedure Laterality Date   AUGMENTATION MAMMAPLASTY  2005   BLADDER SUSPENSION N/A 03/06/2022   Procedure: TRANSVAGINAL TAPE (TVT) PROCEDURE;  Surgeon: Marguerita Beards, MD;  Location: [facility];  Service: Gynecology;  Laterality: N/A;   BUNIONECTOMY Left 1997   CERVICAL BIOPSY  W/ LOOP ELECTRODE EXCISION  1999   CIN I   COLONOSCOPY  11/26/2020   normal   CYSTOCELE REPAIR N/A 03/06/2022   Procedure:  ANTERIOR REPAIR (CYSTOCELE);  Surgeon: Marguerita Beards, MD;  Location: [facility];  Service: Gynecology;  Laterality: N/A;   CYSTOSCOPY N/A 03/06/2022   Procedure: CYSTOSCOPY;  Surgeon: Marguerita Beards, MD;  Location: [facility];  Service: Gynecology;  Laterality: N/A;   ear drum surgery     at age 61   Patient Active Problem List   Diagnosis Date Noted   Primary stress urinary incontinence 07/14/2021   History of melanoma 07/14/2021   Gastroesophageal reflux disease 06/18/2019   Carpal tunnel syndrome, bilateral 06/18/2019   Essential hypertension 05/08/2019   Family history of premature CAD 11/20/2018   Allergic rhinitis 12/17/2012   Anxiety 06/27/2012    PCP: Willow Ora, MD  REFERRING PROVIDER: Willow Ora, MD  REFERRING DIAG: M54.16 (ICD-10-CM) - Left lumbar radiculopathy  Rationale for Evaluation and Treatment: Rehabilitation  THERAPY DIAG:  Other low back pain  Muscle weakness (generalized)  Chronic pain of left knee  ONSET DATE: Months  SUBJECTIVE:  SUBJECTIVE STATEMENT: 12/10/2023 States she felt good until this weekend. States she did a lot of work in the yard and then tried the exercises. Not sure if she over did it.   EVAL: Patient reports that she has had sciatica before but this pain is slightly different.  Reports her pain is in the top of her left glutes and low back and also on the top of her left kneecap.  Reports pain is worse with standing as well as at the end of the day in her knee.  Reports that the knee pain is her primary concern at this time.  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  Patient would like to reduce overall pain and improve overall  function.   PERTINENT HISTORY:  Left knee pain Bladder suspension, left bunion surgery, HTN   PAIN:  Are you having pain? Yes: NPRS scale: 5/10 Pain location: side of the knee and back  Pain description: dull, achy Aggravating factors: laying down  Relieving factors: moving    PRECAUTIONS: None  RED FLAGS: None   WEIGHT BEARING RESTRICTIONS: No  FALLS:  Has patient fallen in last 6 months? Yes - trip     OCCUPATION: preschool - teacher  PLOF: Independent  PATIENT GOALS: To have less pain and to sleep better    OBJECTIVE:  Note: Objective measures were completed at Evaluation unless otherwise noted.  DIAGNOSTIC FINDINGS:  xray 10/15/23 Lumbar  IMPRESSION: 1. Minor degenerative disc disease at L2-L3 and L3-L4. Occasional facet hypertrophy. 2. Slight broad-based dextroscoliotic curvature of the lower thoracolumbar junction.  Left knee IMPRESSION: Slight medial tibiofemoral joint space narrowing.  PATIENT SURVEYS:   Patient-specific activity functional scoring scheme (Point to one number):  "0" represents "unable to perform." "10" represents "able to perform at prior level. 0 1 2 3 4 5 6 7 8 9  10 (Date and Score) Activity Initial  Activity Eval     sleep  0    Bending  6    Sitting criss cross applesauce 5    Additional Additional Total score = sum of the activity scores/number of activities Minimum detectable change (90%CI) for average score = 2 points Minimum detectable change (90%CI) for single activity score = 3 points PSFS developed by: Jake Seats., & Binkley, J. (1995). Assessing disability and change on individual  patients: a report of a patient specific measure. Physiotherapy [place], 47, [phone]. Reproduced with the permission of the authors  Score: 3.66= 11/3   COGNITION: Overall cognitive status: Within functional limits for tasks assessed     SENSATION: Not tested   POSTURE: Hyperextends knees  bilaterally, anterior pelvic tilt  PALPATION: Increased resting tone and tenderness to palpation along left quad, left glutes and piriformis  LUMBAR ROM:   AROM eval  Flexion 25% limited**  Extension 25% limited  Right lateral flexion   Left lateral flexion 25% limited*  Right rotation   Left rotation    (Blank rows = not tested)  * pain in left knee  ** pain in both knees    LE Measurements Lower Extremity Right EVAL Left EVAL   A/PROM MMT A/PROM MMT  Hip Flexion  4-*  4-*  Hip Extension  4 -  4 -  Hip Abduction      Hip Adduction      Hip Internal rotation [facility]  [facility]   Hip External rotation [facility]  [facility]  Knee Flexion WFL 4 WFL 4  Knee Extension Excessive 5 4 Excessive 5 4*  Ankle Dorsiflexion  4+  4+  Ankle Plantarflexion      Ankle Inversion      Ankle Eversion       (Blank rows = not tested) * pain     TREATMENT DATE:                                                                                                                               12/10/2023  Therapeutic Exercise:  Review of HEP: when to perform what exercises (when in pain vs no pain) Supine: bridge x15 5" holds,   Prone: BELLY breathing over bolster 5 minutes  Seated:  piriformis stretch x1 30" holds B Neuromuscular Re-education: lateral costal breathing with patient prone and supine - with and without tactile cues 10 minutes, long exhale breathing tactile cues with reaching 8 minutes Manual Therapy: Soft tissue mobilization with and without percussion gun as vibration to left glutes and low back with towel for padding 12 minutes Therapeutic Activity: Self Care: Trigger Point Dry Needling:  Modalities: thermotherapy to low back in prone during manual interventions.     PATIENT EDUCATION:  Education details: on HEP   Person educated: Patient Education method: Programmer, multimedia, Facilities manager, and Handouts Education comprehension: verbalized understanding   HOME EXERCISE PROGRAM: [id number] Long  exhale breathing Posterior lower rib breathing   ASSESSMENT:  CLINICAL IMPRESSION: 12/10/2023 Session focused initially on pain management. Tolerated heat and manual well with reduced pain. Focused on breathing to help with core activation, posture and pain management. Reviewed entire HEP and answered all questions. No pain noted end of session, will continue with current POC as tolerated.    EVAL: Patient presents to physical therapy with complaints of left low back and left knee pain that started a while ago.  Patient with increased resting tone in left quadriceps and left glutes muscles.  Patient also demonstrates hyperextension bilateral knees with all standing postures and positions and presents with sacral sitting and seated posture.  Educated patient in current presentation as well as postural contributions to current pain presentation.  Patient would greatly benefit from skilled PT to address physical impairments and improve overall active postures to relieve stress on joints and improve overall quality of life.  OBJECTIVE IMPAIRMENTS: decreased activity tolerance, difficulty walking, decreased ROM, decreased strength, improper body mechanics, postural dysfunction, and pain.   ACTIVITY LIMITATIONS: standing, squatting, transfers, and locomotion level  PARTICIPATION LIMITATIONS: cleaning, community activity, and occupation  PERSONAL FACTORS: Fitness and Time since onset of injury/illness/exacerbation are also affecting patient's functional outcome.   REHAB POTENTIAL: Good  CLINICAL DECISION MAKING: Stable/uncomplicated  EVALUATION COMPLEXITY: Low   GOALS: Goals reviewed with patient? yes  SHORT TERM GOALS: Target date: 12/17/2023 Patient will be independent in self management strategies to improve quality of life and functional outcomes. Baseline: New Program Goal status: INITIAL  2.  Patient will report  at least 50% improvement in overall symptoms and/or function to  demonstrate improved functional mobility Baseline: 0% better Goal status: INITIAL  3.  Patient will report improved awareness of body position and reduce hyperextension of the knees while standing at work Baseline: Not currently Goal status: INITIAL      LONG TERM GOALS: Target date: 01/28/2024  Patient will report at least 75% improvement in overall symptoms and/or function to demonstrate improved functional mobility Baseline: 0% better Goal status: INITIAL  2.  Patient will score at least 2 points higher on PSFS average to demonstrate change in overall function. Baseline: see above Goal status: INITIAL  3.  Patient will demonstrate pain-free manual muscle testing in lower extremities Baseline: Painful Goal status: INITIAL  4.  Patient will demonstrate reduced resting tone in left quad and left glutes Baseline: Increased resting tone Goal status: INITIAL   PLAN:  PT FREQUENCY: 1-2x/week for total of 16 visits over 12 weeks certification.  PT DURATION: 12 weeks PLANNED INTERVENTIONS: 97110-Therapeutic exercises, 97530- Therapeutic activity, [id number]- Neuromuscular re-education, [phone]- Self Care, 60454- Manual therapy, [phone]- Gait training, [phone]- Orthotic Fit/training, [phone]- Canalith repositioning, [id number]- Aquatic Therapy, 97014- Electrical stimulation (unattended), [phone]- Ionotophoresis 4mg /ml Dexamethasone, Patient/Family education, Balance training, Stair training, Taping, Dry Needling, Joint mobilization, Joint manipulation, Spinal manipulation, Spinal mobilization, Cryotherapy, and Moist heat   PLAN FOR NEXT SESSION: HIP ER ACTIVATION - BILATERALLY   Focus on posture reduced hyperextension in knees, reduce resting tone in quads and glutes, on dry needling and soft tissue work, stretches   3:07 PM, 12/10/23 Tereasa Coop, DPT Physical Therapy with Dolores Lory

## 2023-12-12 ENCOUNTER — Ambulatory Visit (INDEPENDENT_AMBULATORY_CARE_PROVIDER_SITE_OTHER): Payer: 59 | Admitting: Physical Therapy

## 2023-12-12 ENCOUNTER — Encounter: Payer: Self-pay | Admitting: Physical Therapy

## 2023-12-12 DIAGNOSIS — M5459 Other low back pain: Secondary | ICD-10-CM | POA: Diagnosis not present

## 2023-12-12 DIAGNOSIS — M25562 Pain in left knee: Secondary | ICD-10-CM

## 2023-12-12 DIAGNOSIS — M6281 Muscle weakness (generalized): Secondary | ICD-10-CM | POA: Diagnosis not present

## 2023-12-12 DIAGNOSIS — G8929 Other chronic pain: Secondary | ICD-10-CM | POA: Diagnosis not present

## 2023-12-12 NOTE — Therapy (Signed)
 OUTPATIENT PHYSICAL THERAPY THORACOLUMBAR TREATMENT   Patient Name: [name] MRN: [id number] DOB:08-10-1971, 53 y.o., female Today's Date: 12/12/2023  END OF SESSION:  PT End of Session - 12/12/23 1343     Visit Number 6    Number of Visits 16    Date for PT Re-Evaluation 01/28/24    Authorization Type VL 30 Jari Favre    Authorization - Visit Number 6    Authorization - Number of Visits 30    Progress Note Due on Visit 10    PT Start Time 1345    PT Stop Time 1425    PT Time Calculation (min) 40 min    Activity Tolerance Patient tolerated treatment well    Behavior During Therapy WFL for tasks assessed/performed               Past Medical History:  Diagnosis Date   Anemia    as a child and during pregnancy   Anxiety    CIN I (cervical intraepithelial neoplasia I)    1999-LEEP, normal Paps after    Depression    Essential hypertension 05/08/2019   Patient follows with Dr. Asencion Partridge, PCP. Theron Arista 07/14/21 as of 02/28/22.   Family history of premature CAD 11/20/2018   Mom   GERD (gastroesophageal reflux disease)    H/O gestational diabetes mellitus, not currently pregnant    GESTATIONAL     Headache    hx of migraines, migraine w/parathesia on 04/04/2019, MRI normal   History of melanoma 07/14/2021   Face, 20s. Sees Dr. Terri Piedra annually   Prolapse of anterior vaginal wall 2023   SUI (stress urinary incontinence, female) 2023   Tinnitus aurium, bilateral    since 2018, off and on   Wears glasses    reading only   Past Surgical History:  Procedure Laterality Date   AUGMENTATION MAMMAPLASTY  2005   BLADDER SUSPENSION N/A 03/06/2022   Procedure: TRANSVAGINAL TAPE (TVT) PROCEDURE;  Surgeon: Marguerita Beards, MD;  Location: [facility];  Service: Gynecology;  Laterality: N/A;   BUNIONECTOMY Left 1997   CERVICAL BIOPSY  W/ LOOP ELECTRODE EXCISION  1999   CIN I   COLONOSCOPY  11/26/2020   normal   CYSTOCELE REPAIR N/A 03/06/2022   Procedure:  ANTERIOR REPAIR (CYSTOCELE);  Surgeon: Marguerita Beards, MD;  Location: [facility];  Service: Gynecology;  Laterality: N/A;   CYSTOSCOPY N/A 03/06/2022   Procedure: CYSTOSCOPY;  Surgeon: Marguerita Beards, MD;  Location: [facility];  Service: Gynecology;  Laterality: N/A;   ear drum surgery     at age 75   Patient Active Problem List   Diagnosis Date Noted   Primary stress urinary incontinence 07/14/2021   History of melanoma 07/14/2021   Gastroesophageal reflux disease 06/18/2019   Carpal tunnel syndrome, bilateral 06/18/2019   Essential hypertension 05/08/2019   Family history of premature CAD 11/20/2018   Allergic rhinitis 12/17/2012   Anxiety 06/27/2012    PCP: Willow Ora, MD  REFERRING PROVIDER: Willow Ora, MD  REFERRING DIAG: M54.16 (ICD-10-CM) - Left lumbar radiculopathy  Rationale for Evaluation and Treatment: Rehabilitation  THERAPY DIAG:  Other low back pain  Muscle weakness (generalized)  Chronic pain of left knee  ONSET DATE: Months  SUBJECTIVE:  SUBJECTIVE STATEMENT: 12/12/2023 States she notices in the PM when she waits for the kids to be picked up she notes the pain worse. States that the pain is in the front of the knee. States she stands there for 15 minutes and notices it 5 minutes into. Report she was a little sore in the back and knee after last apt. States that she was practicing but doesn't have a belt yet. Reports that overall she feels better and feels 70-75% better since start of PT.  EVAL: Patient reports that she has had sciatica before but this pain is slightly different.  Reports her pain is in the top of her left glutes and low back and also on the top of her left kneecap.  Reports pain is worse with standing as well  as at the end of the day in her knee.  Reports that the knee pain is her primary concern at this time.  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  Patient would like to reduce overall pain and improve overall function.   PERTINENT HISTORY:  Left knee pain Bladder suspension, left bunion surgery, HTN   PAIN:  Are you having pain? Yes: NPRS scale: 4/10 Pain location: side of the knee and back  Pain description: dull, achy Aggravating factors: laying down  Relieving factors: moving    PRECAUTIONS: None  RED FLAGS: None   WEIGHT BEARING RESTRICTIONS: No  FALLS:  Has patient fallen in last 6 months? Yes - trip     OCCUPATION: preschool - teacher  PLOF: Independent  PATIENT GOALS: To have less pain and to sleep better    OBJECTIVE:  Note: Objective measures were completed at Evaluation unless otherwise noted.  DIAGNOSTIC FINDINGS:  xray 10/15/23 Lumbar  IMPRESSION: 1. Minor degenerative disc disease at L2-L3 and L3-L4. Occasional facet hypertrophy. 2. Slight broad-based dextroscoliotic curvature of the lower thoracolumbar junction.  Left knee IMPRESSION: Slight medial tibiofemoral joint space narrowing.  PATIENT SURVEYS:   Patient-specific activity functional scoring scheme (Point to one number):  "0" represents "unable to perform." "10" represents "able to perform at prior level. 0 1 2 3 4 5 6 7 8 9  10 (Date and Score) Activity Initial  Activity Eval   12/12/23  sleep  0  8  Bending  6  7  Sitting criss cross applesauce 5 8   Additional Additional Total score = sum of the activity scores/number of activities Minimum detectable change (90%CI) for average score = 2 points Minimum detectable change (90%CI) for single activity score = 3 points PSFS developed by: Jake Seats., & Binkley, J. (1995). Assessing disability and change on individual  patients: a  report of a patient specific measure. Physiotherapy [place], 47, [phone]. Reproduced with the permission of the authors  Score: Eval: 3.66= 11/3  12/12/23: 23/3= 7.66   COGNITION: Overall cognitive status: Within functional limits for tasks assessed     SENSATION: Not tested   POSTURE: Hyperextends knees bilaterally, anterior pelvic tilt  PALPATION: Increased resting tone and tenderness to palpation along left quad, left glutes and piriformis  LUMBAR ROM:   AROM eval  Flexion 25% limited**  Extension 25% limited  Right lateral flexion   Left lateral flexion 25% limited*  Right rotation   Left rotation    (Blank rows = not tested)  * pain in left knee  ** pain in both knees  LE Measurements Lower Extremity Right EVAL Left EVAL   A/PROM MMT A/PROM MMT  Hip Flexion  4-*  4-*  Hip Extension  4 -  4 -  Hip Abduction      Hip Adduction      Hip Internal rotation [facility]  [facility]   Hip External rotation [facility]  WFL   Knee Flexion WFL 4 WFL 4  Knee Extension Excessive 5 4 Excessive 5 4*  Ankle Dorsiflexion  4+  4+  Ankle Plantarflexion      Ankle Inversion      Ankle Eversion       (Blank rows = not tested) * pain     TREATMENT DATE:                                                                                                                               12/12/2023  Therapeutic Exercise:  Objective measures updated Review of HEP   Neuromuscular Re-education: posture review with standing for 5 minutes- favors R LE and pops out hip - passive vs active posture education 2 minutes, long exhale breathing 2  minutes verbal and tactile cues, lateral costal breathing in crunch position/with 10# weight and standing at wall - best with weight but still challenging 12 minutes Cat/cow with tactile cues for segmental mobility -9 minutes   Manual Therapy:   Therapeutic Activity: Self Care: Trigger Point Dry Needling:  Modalities:      PATIENT EDUCATION:  Education details:  on HEP   Person educated: Patient Education method: Programmer, multimedia, Facilities manager, and Handouts Education comprehension: verbalized understanding   HOME EXERCISE PROGRAM: [id number] Long exhale breathing Posterior lower rib breathing   ASSESSMENT:  CLINICAL IMPRESSION: 12/12/2023 Overall patient doing well and has met 2 long term goals and 1 short term goal. Focused on posture and breathing today as well as learning segmental mobility. Fatigue noted but no pain. Segmental motion challenging for patient, will continue with this moving forward.    EVAL: Patient presents to physical therapy with complaints of left low back and left knee pain that started a while ago.  Patient with increased resting tone in left quadriceps and left glutes muscles.  Patient also demonstrates hyperextension bilateral knees with all standing postures and positions and presents with sacral sitting and seated posture.  Educated patient in current presentation as well as postural contributions to current pain presentation.  Patient would greatly benefit from skilled PT to address physical impairments and improve overall active postures to relieve stress on joints and improve overall quality of life.  OBJECTIVE IMPAIRMENTS: decreased activity tolerance, difficulty walking, decreased ROM, decreased strength, improper body mechanics, postural dysfunction, and pain.   ACTIVITY LIMITATIONS: standing, squatting, transfers, and locomotion level  PARTICIPATION LIMITATIONS: cleaning, community activity, and occupation  PERSONAL FACTORS: Fitness and Time since onset of injury/illness/exacerbation are also affecting patient's functional outcome.   REHAB POTENTIAL: Good  CLINICAL DECISION MAKING: Stable/uncomplicated  EVALUATION COMPLEXITY: Low  GOALS: Goals reviewed with patient? yes  SHORT TERM GOALS: Target date: 12/17/2023 Patient will be independent in self management strategies to improve quality of life and  functional outcomes. Baseline: New Program Goal status: PROGRESSING   2.  Patient will report at least 50% improvement in overall symptoms and/or function to demonstrate improved functional mobility Baseline: 0% better Goal status: MET  3.  Patient will report improved awareness of body position and reduce hyperextension of the knees while standing at work Baseline: Not currently Goal status: INITIAL      LONG TERM GOALS: Target date: 01/28/2024  Patient will report at least 75% improvement in overall symptoms and/or function to demonstrate improved functional mobility Baseline: 0% better Goal status: MET  2.  Patient will score at least 2 points higher on PSFS average to demonstrate change in overall function. Baseline: see above Goal status: MET  3.  Patient will demonstrate pain-free manual muscle testing in lower extremities Baseline: Painful Goal status: INITIAL  4.  Patient will demonstrate reduced resting tone in left quad and left glutes Baseline: Increased resting tone Goal status: INITIAL   PLAN:  PT FREQUENCY: 1-2x/week for total of 16 visits over 12 weeks certification.  PT DURATION: 12 weeks PLANNED INTERVENTIONS: 97110-Therapeutic exercises, 97530- Therapeutic activity, [id number]- Neuromuscular re-education, [phone]- Self Care, 29562- Manual therapy, [phone]- Gait training, [phone]- Orthotic Fit/training, [phone]- Canalith repositioning, [id number]- Aquatic Therapy, [id number]- Electrical stimulation (unattended), [phone]- Ionotophoresis 4mg /ml Dexamethasone, Patient/Family education, Balance training, Stair training, Taping, Dry Needling, Joint mobilization, Joint manipulation, Spinal manipulation, Spinal mobilization, Cryotherapy, and Moist heat   PLAN FOR NEXT SESSION: HIP ER ACTIVATION - BILATERALLY   Focus on posture reduced hyperextension in knees, reduce resting tone in quads and glutes, on dry needling and soft tissue work, stretches   2:31 PM, 12/12/23 Tereasa Coop,  DPT Physical Therapy with Dolores Lory

## 2023-12-17 ENCOUNTER — Ambulatory Visit: Payer: 59 | Admitting: Physical Therapy

## 2023-12-17 ENCOUNTER — Encounter: Payer: Self-pay | Admitting: Physical Therapy

## 2023-12-17 DIAGNOSIS — G8929 Other chronic pain: Secondary | ICD-10-CM

## 2023-12-17 DIAGNOSIS — M5459 Other low back pain: Secondary | ICD-10-CM

## 2023-12-17 DIAGNOSIS — M25562 Pain in left knee: Secondary | ICD-10-CM | POA: Diagnosis not present

## 2023-12-17 DIAGNOSIS — M6281 Muscle weakness (generalized): Secondary | ICD-10-CM | POA: Diagnosis not present

## 2023-12-17 NOTE — Therapy (Signed)
 OUTPATIENT PHYSICAL THERAPY THORACOLUMBAR TREATMENT   Patient Name: [name] MRN: [id number] DOB:28-Oct-1970, 53 y.o., female Today's Date: 12/17/2023  END OF SESSION:  PT End of Session - 12/17/23 1352     Visit Number 7    Number of Visits 16    Date for PT Re-Evaluation 01/28/24    Authorization Type VL 30 Jari Favre    Authorization - Visit Number 7    Authorization - Number of Visits 30    Progress Note Due on Visit 10    PT Start Time 1352    PT Stop Time 1430    PT Time Calculation (min) 38 min    Activity Tolerance Patient tolerated treatment well    Behavior During Therapy WFL for tasks assessed/performed               Past Medical History:  Diagnosis Date   Anemia    as a child and during pregnancy   Anxiety    CIN I (cervical intraepithelial neoplasia I)    1999-LEEP, normal Paps after    Depression    Essential hypertension 05/08/2019   Patient follows with Dr. Asencion Partridge, PCP. Theron Arista 07/14/21 as of 02/28/22.   Family history of premature CAD 11/20/2018   Mom   GERD (gastroesophageal reflux disease)    H/O gestational diabetes mellitus, not currently pregnant    GESTATIONAL     Headache    hx of migraines, migraine w/parathesia on 04/04/2019, MRI normal   History of melanoma 07/14/2021   Face, 20s. Sees Dr. Terri Piedra annually   Prolapse of anterior vaginal wall 2023   SUI (stress urinary incontinence, female) 2023   Tinnitus aurium, bilateral    since 2018, off and on   Wears glasses    reading only   Past Surgical History:  Procedure Laterality Date   AUGMENTATION MAMMAPLASTY  2005   BLADDER SUSPENSION N/A 03/06/2022   Procedure: TRANSVAGINAL TAPE (TVT) PROCEDURE;  Surgeon: Marguerita Beards, MD;  Location: [facility];  Service: Gynecology;  Laterality: N/A;   BUNIONECTOMY Left 1997   CERVICAL BIOPSY  W/ LOOP ELECTRODE EXCISION  1999   CIN I   COLONOSCOPY  11/26/2020   normal   CYSTOCELE REPAIR N/A 03/06/2022   Procedure:  ANTERIOR REPAIR (CYSTOCELE);  Surgeon: Marguerita Beards, MD;  Location: [facility];  Service: Gynecology;  Laterality: N/A;   CYSTOSCOPY N/A 03/06/2022   Procedure: CYSTOSCOPY;  Surgeon: Marguerita Beards, MD;  Location: [facility];  Service: Gynecology;  Laterality: N/A;   ear drum surgery     at age 49   Patient Active Problem List   Diagnosis Date Noted   Primary stress urinary incontinence 07/14/2021   History of melanoma 07/14/2021   Gastroesophageal reflux disease 06/18/2019   Carpal tunnel syndrome, bilateral 06/18/2019   Essential hypertension 05/08/2019   Family history of premature CAD 11/20/2018   Allergic rhinitis 12/17/2012   Anxiety 06/27/2012    PCP: Willow Ora, MD  REFERRING PROVIDER: Willow Ora, MD  REFERRING DIAG: M54.16 (ICD-10-CM) - Left lumbar radiculopathy  Rationale for Evaluation and Treatment: Rehabilitation  THERAPY DIAG:  Other low back pain  Muscle weakness (generalized)  Chronic pain of left knee  ONSET DATE: Months  SUBJECTIVE:  SUBJECTIVE STATEMENT: 12/17/2023 States her knee started bothering her. Doesn't know if its from being on it so much. States her back feels OK.  EVAL: Patient reports that she has had sciatica before but this pain is slightly different.  Reports her pain is in the top of her left glutes and low back and also on the top of her left kneecap.  Reports pain is worse with standing as well as at the end of the day in her knee.  Reports that the knee pain is her primary concern at this time.  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  Patient would like to reduce overall pain and improve overall function.   PERTINENT  HISTORY:  Left knee pain Bladder suspension, left bunion surgery, HTN   PAIN:  Are you having pain? Yes: NPRS scale: 6/10 Pain location: side of the knee and back  Pain description: dull, achy Aggravating factors: laying down  Relieving factors: moving    PRECAUTIONS: None  RED FLAGS: None   WEIGHT BEARING RESTRICTIONS: No  FALLS:  Has patient fallen in last 6 months? Yes - trip     OCCUPATION: preschool - teacher  PLOF: Independent  PATIENT GOALS: To have less pain and to sleep better    OBJECTIVE:  Note: Objective measures were completed at Evaluation unless otherwise noted.  DIAGNOSTIC FINDINGS:  xray 10/15/23 Lumbar  IMPRESSION: 1. Minor degenerative disc disease at L2-L3 and L3-L4. Occasional facet hypertrophy. 2. Slight broad-based dextroscoliotic curvature of the lower thoracolumbar junction.  Left knee IMPRESSION: Slight medial tibiofemoral joint space narrowing.  PATIENT SURVEYS:   Patient-specific activity functional scoring scheme (Point to one number):  "0" represents "unable to perform." "10" represents "able to perform at prior level. 0 1 2 3 4 5 6 7 8 9  10 (Date and Score) Activity Initial  Activity Eval   12/12/23  sleep  0  8  Bending  6  7  Sitting criss cross applesauce 5 8   Additional Additional Total score = sum of the activity scores/number of activities Minimum detectable change (90%CI) for average score = 2 points Minimum detectable change (90%CI) for single activity score = 3 points PSFS developed by: Jake Seats., & Binkley, J. (1995). Assessing disability and change on individual  patients: a report of a patient specific measure. Physiotherapy [place], 47, [phone]. Reproduced with the permission of the authors  Score: Eval: 3.66= 11/3  12/12/23: 23/3= 7.66   COGNITION: Overall cognitive status: Within functional limits for tasks assessed     SENSATION: Not tested   POSTURE: Hyperextends  knees bilaterally, anterior pelvic tilt  PALPATION: Increased resting tone and tenderness to palpation along left quad, left glutes and piriformis  LUMBAR ROM:   AROM eval  Flexion 25% limited**  Extension 25% limited  Right lateral flexion   Left lateral flexion 25% limited*  Right rotation   Left rotation    (Blank rows = not tested)  * pain in left knee  ** pain in both knees    LE Measurements Lower Extremity Right EVAL Left EVAL   A/PROM MMT A/PROM MMT  Hip Flexion  4-*  4-*  Hip Extension  4 -  4 -  Hip Abduction      Hip Adduction      Hip Internal rotation [facility]  [facility]   Hip External rotation [facility]  [facility]   Knee Flexion  WFL 4 WFL 4  Knee Extension Excessive 5 4 Excessive 5 4*  Ankle Dorsiflexion  4+  4+  Ankle Plantarflexion      Ankle Inversion      Ankle Eversion       (Blank rows = not tested) * pain     TREATMENT DATE:                                                                                                                               12/17/2023  Therapeutic Exercise:  Review of HEP  POE 3 minutes, prone belly breathing 3 minutes Standing: lumbar extension at wall x30 5" holds, l stretch at counter x10 15" hlds  Neuromuscular Re-education: STS with posterior cues like chair to improve hip hinge and glute activation 10 minutes, hip hinge facing table - verbal and tactile cues FOR PROPER HIP HINGE 10 MINUTES Manual Therapy:   Therapeutic Activity: Self Care: Trigger Point Dry Needling:  Modalities:  thermo therapy to low back during prone interventions     PATIENT EDUCATION:  Education details: on HEP   Person educated: Patient Education method: Programmer, multimedia, Facilities manager, and Handouts Education comprehension: verbalized understanding   HOME EXERCISE PROGRAM: [id number] Long exhale breathing Posterior lower rib breathing   ASSESSMENT:  CLINICAL IMPRESSION: 12/17/2023 Patient responded positively to lumbar extension exercises reported  abolished left knee symptoms afterwards. Prone exercises tolerated best but educated patient in standing exercises as laying down is not always an option. No pain just soreness noted end of session.    EVAL: Patient presents to physical therapy with complaints of left low back and left knee pain that started a while ago.  Patient with increased resting tone in left quadriceps and left glutes muscles.  Patient also demonstrates hyperextension bilateral knees with all standing postures and positions and presents with sacral sitting and seated posture.  Educated patient in current presentation as well as postural contributions to current pain presentation.  Patient would greatly benefit from skilled PT to address physical impairments and improve overall active postures to relieve stress on joints and improve overall quality of life.  OBJECTIVE IMPAIRMENTS: decreased activity tolerance, difficulty walking, decreased ROM, decreased strength, improper body mechanics, postural dysfunction, and pain.   ACTIVITY LIMITATIONS: standing, squatting, transfers, and locomotion level  PARTICIPATION LIMITATIONS: cleaning, community activity, and occupation  PERSONAL FACTORS: Fitness and Time since onset of injury/illness/exacerbation are also affecting patient's functional outcome.   REHAB POTENTIAL: Good  CLINICAL DECISION MAKING: Stable/uncomplicated  EVALUATION COMPLEXITY: Low   GOALS: Goals reviewed with patient? yes  SHORT TERM GOALS: Target date: 12/17/2023 Patient will be independent in self management strategies to improve quality of life and functional outcomes. Baseline: New Program Goal status: PROGRESSING   2.  Patient will report at least 50% improvement in overall symptoms and/or function to demonstrate improved functional mobility Baseline: 0% better Goal status: MET  3.  Patient will report improved awareness of body position and  reduce hyperextension of the knees while standing at  work Baseline: Not currently Goal status: INITIAL      LONG TERM GOALS: Target date: 01/28/2024  Patient will report at least 75% improvement in overall symptoms and/or function to demonstrate improved functional mobility Baseline: 0% better Goal status: MET  2.  Patient will score at least 2 points higher on PSFS average to demonstrate change in overall function. Baseline: see above Goal status: MET  3.  Patient will demonstrate pain-free manual muscle testing in lower extremities Baseline: Painful Goal status: INITIAL  4.  Patient will demonstrate reduced resting tone in left quad and left glutes Baseline: Increased resting tone Goal status: INITIAL   PLAN:  PT FREQUENCY: 1-2x/week for total of 16 visits over 12 weeks certification.  PT DURATION: 12 weeks PLANNED INTERVENTIONS: 97110-Therapeutic exercises, 97530- Therapeutic activity, [id number]- Neuromuscular re-education, [phone]- Self Care, 57846- Manual therapy, [phone]- Gait training, [phone]- Orthotic Fit/training, [phone]- Canalith repositioning, [id number]- Aquatic Therapy, 97014- Electrical stimulation (unattended), [phone]- Ionotophoresis 4mg /ml Dexamethasone, Patient/Family education, Balance training, Stair training, Taping, Dry Needling, Joint mobilization, Joint manipulation, Spinal manipulation, Spinal mobilization, Cryotherapy, and Moist heat   PLAN FOR NEXT SESSION: HIP ER ACTIVATION - BILATERALLY   Focus on posture reduced hyperextension in knees, reduce resting tone in quads and glutes, on dry needling and soft tissue work, stretches   2:34 PM, 12/17/23 Tereasa Coop, DPT Physical Therapy with Dolores Lory

## 2024-02-14 ENCOUNTER — Ambulatory Visit (INDEPENDENT_AMBULATORY_CARE_PROVIDER_SITE_OTHER): Admitting: Physical Therapy

## 2024-02-14 ENCOUNTER — Encounter: Payer: Self-pay | Admitting: Physical Therapy

## 2024-02-14 DIAGNOSIS — G8929 Other chronic pain: Secondary | ICD-10-CM | POA: Diagnosis not present

## 2024-02-14 DIAGNOSIS — M25562 Pain in left knee: Secondary | ICD-10-CM | POA: Diagnosis not present

## 2024-02-14 DIAGNOSIS — M6281 Muscle weakness (generalized): Secondary | ICD-10-CM | POA: Diagnosis not present

## 2024-02-14 DIAGNOSIS — M5459 Other low back pain: Secondary | ICD-10-CM

## 2024-02-14 NOTE — Therapy (Signed)
 OUTPATIENT PHYSICAL THERAPY THORACOLUMBAR TREATMENT AND RECERT   Patient Name: [name] MRN: [id number] DOB:May 10, 1971, 53 y.o., female Today's Date: 02/14/2024  END OF SESSION:  PT End of Session - 02/14/24 1433     Visit Number 8    Number of Visits 20    Date for PT Re-Evaluation 05/08/24    Authorization Type VL 30 Danney Dutton    Authorization - Visit Number 8    Authorization - Number of Visits 30    Progress Note Due on Visit 10    PT Start Time 1435    PT Stop Time 1513    PT Time Calculation (min) 38 min    Activity Tolerance Patient tolerated treatment well    Behavior During Therapy WFL for tasks assessed/performed               Past Medical History:  Diagnosis Date   Anemia    as a child and during pregnancy   Anxiety    CIN I (cervical intraepithelial neoplasia I)    1999-LEEP, normal Paps after    Depression    Essential hypertension 05/08/2019   Patient follows with Dr. Karma Oz, PCP. Holli Lunger 07/14/21 as of 02/28/22.   Family history of premature CAD 11/20/2018   Mom   GERD (gastroesophageal reflux disease)    H/O gestational diabetes mellitus, not currently pregnant    GESTATIONAL     Headache    hx of migraines, migraine w/parathesia on 04/04/2019, MRI normal   History of melanoma 07/14/2021   Face, 20s. Sees Dr. Fleurette Humbles annually   Prolapse of anterior vaginal wall 2023   SUI (stress urinary incontinence, female) 2023   Tinnitus aurium, bilateral    since 2018, off and on   Wears glasses    reading only   Past Surgical History:  Procedure Laterality Date   AUGMENTATION MAMMAPLASTY  2005   BLADDER SUSPENSION N/A 03/06/2022   Procedure: TRANSVAGINAL TAPE (TVT) PROCEDURE;  Surgeon: Arma Lamp, MD;  Location: [facility];  Service: Gynecology;  Laterality: N/A;   BUNIONECTOMY Left 1997   CERVICAL BIOPSY  W/ LOOP ELECTRODE EXCISION  1999   CIN I   COLONOSCOPY  11/26/2020   normal   CYSTOCELE REPAIR N/A 03/06/2022    Procedure: ANTERIOR REPAIR (CYSTOCELE);  Surgeon: Arma Lamp, MD;  Location: [facility];  Service: Gynecology;  Laterality: N/A;   CYSTOSCOPY N/A 03/06/2022   Procedure: CYSTOSCOPY;  Surgeon: Arma Lamp, MD;  Location: [facility];  Service: Gynecology;  Laterality: N/A;   ear drum surgery     at age 33   Patient Active Problem List   Diagnosis Date Noted   Primary stress urinary incontinence 07/14/2021   History of melanoma 07/14/2021   Gastroesophageal reflux disease 06/18/2019   Carpal tunnel syndrome, bilateral 06/18/2019   Essential hypertension 05/08/2019   Family history of premature CAD 11/20/2018   Allergic rhinitis 12/17/2012   Anxiety 06/27/2012    PCP: Luevenia Saha, MD  REFERRING PROVIDER: Luevenia Saha, MD  REFERRING DIAG: M54.16 (ICD-10-CM) - Left lumbar radiculopathy  Rationale for Evaluation and Treatment: Rehabilitation  THERAPY DIAG:  Other low back pain - Plan: PT plan of care cert/re-cert  Muscle weakness (generalized) - Plan: PT plan of care cert/re-cert  Chronic pain of left knee - Plan: PT plan of care cert/re-cert  ONSET DATE: Months  SUBJECTIVE:  SUBJECTIVE STATEMENT: 02/14/2024 States last night she was in more pain at night unsure of what is causing it. States that long car rides bothers her. States she is flying to [place] Monday. Sitting in the low chairs at work bother her. Reports she feels 75% better. States when she is doing her exercises  she feels better.   EVAL: Patient reports that she has had sciatica before but this pain is slightly different.  Reports her pain is in the top of her left glutes and low back and also on the top of her left kneecap.  Reports pain is worse with standing as well as at the end  of the day in her knee.  Reports that the knee pain is her primary concern at this time.  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  Patient would like to reduce overall pain and improve overall function.   PERTINENT HISTORY:  Left knee pain Bladder suspension, left bunion surgery, HTN   PAIN:  Are you having pain? Yes: NPRS scale: 8/10 Pain location: side of the knee and back  Pain description: dull, achy Aggravating factors: laying down  Relieving factors: moving    PRECAUTIONS: None  RED FLAGS: None   WEIGHT BEARING RESTRICTIONS: No  FALLS:  Has patient fallen in last 6 months? Yes - trip     OCCUPATION: preschool - teacher  PLOF: Independent  PATIENT GOALS: To have less pain and to sleep better    OBJECTIVE:  Note: Objective measures were completed at Evaluation unless otherwise noted.  DIAGNOSTIC FINDINGS:  xray 10/15/23 Lumbar  IMPRESSION: 1. Minor degenerative disc disease at L2-L3 and L3-L4. Occasional facet hypertrophy. 2. Slight broad-based dextroscoliotic curvature of the lower thoracolumbar junction.  Left knee IMPRESSION: Slight medial tibiofemoral joint space narrowing.  PATIENT SURVEYS:   Patient-specific activity functional scoring scheme (Point to one number):  "0" represents "unable to perform." "10" represents "able to perform at prior level. 0 1 2 3 4 5 6 7 8 9  10 (Date and Score) Activity Initial  Activity Eval   12/12/23 02/14/24  sleep  0  8 5  Bending  6  7 8   Sitting criss cross applesauce 5 8 6    Additional Additional Total score = sum of the activity scores/number of activities Minimum detectable change (90%CI) for average score = 2 points Minimum detectable change (90%CI) for single activity score = 3 points PSFS developed by: Melbourne Spitz., & Binkley, J. (1995). Assessing disability and change on individual  patients: a  report of a patient specific measure. Physiotherapy [place], 47, [phone]. Reproduced with the permission of the authors  Score: Eval: 3.66= 11/3  12/12/23: 23/3= 7.66 5/29: 19/3= 6.33   COGNITION: Overall cognitive status: Within functional limits for tasks assessed     SENSATION: Not tested   POSTURE: Hyperextends knees bilaterally, anterior pelvic tilt  PALPATION: Increased resting tone and tenderness to palpation along left quad, left glutes and piriformis  LUMBAR ROM:   AROM eval 5/29  Flexion 25% limited** 25% limited**  Extension 25% limited 25% limited  Right lateral flexion  25% limited*  Left lateral flexion 25% limited* 25% limited  Right rotation    Left rotation     (Blank rows = not tested)  * pain in  back   ** pain in both knees    LE Measurements Lower Extremity Right 5/29 Left 5/29  A/PROM MMT A/PROM MMT  Hip Flexion  4-*  4-*  Hip Extension  4 -  4 -  Hip Abduction      Hip Adduction      Hip Internal rotation [facility]  [facility]   Hip External rotation [facility]  [facility]   Knee Flexion WFL 4+ WFL 4*  Knee Extension Excessive 5 4+ Excessive 5 4+  Ankle Dorsiflexion  4+  4+  Ankle Plantarflexion      Ankle Inversion      Ankle Eversion       (Blank rows = not tested) * pain     TREATMENT DATE:                                                                                                                               02/14/2024  Therapeutic Exercise:  Review of HEP  Objective measures updated, goals updated POE 3 minutes, prone belly breathing 3 minutes Supine: bridges x15 5" holds, bent knee fall outs 2 minutes S/l clamshells 2x10B Seated : piriformis stretch x2 30" holds B Standing: self massage with tennis ball to left hip - tolerated well    Neuromuscular Re-education:   Manual Therapy:   Therapeutic Activity: Self Care: Trigger Point Dry Needling:  Modalities:  thermo therapy to low back during prone interventions     PATIENT EDUCATION:   Education details: on HEP, on progress and plan moving forward   Person educated: Patient Education method: Explanation, Demonstration, and Handouts Education comprehension: verbalized understanding   HOME EXERCISE PROGRAM: [id number] Long exhale breathing Posterior lower rib breathing   ASSESSMENT:  CLINICAL IMPRESSION: 02/14/2024 Patient returns to therapy after hiatus due to scheduling difficulties. Overall patient has made progress in subjective measures but continues to have limitations in objective measures and some symptoms. Continues to have difficulties sitting for more than 20 minutes at a time and finding a comfortable position at night to sleep. Extending POC to continue to work on remaining goals and new goals to improve overall function and quality of life.    EVAL: Patient presents to physical therapy with complaints of left low back and left knee pain that started a while ago.  Patient with increased resting tone in left quadriceps and left glutes muscles.  Patient also demonstrates hyperextension bilateral knees with all standing postures and positions and presents with sacral sitting and seated posture.  Educated patient in current presentation as well as postural contributions to current pain presentation.  Patient would greatly benefit from skilled PT to address physical impairments and improve overall active postures to relieve stress on joints and improve overall quality of life.  OBJECTIVE IMPAIRMENTS: decreased activity tolerance, difficulty walking, decreased ROM, decreased strength, improper body mechanics, postural dysfunction, and pain.   ACTIVITY LIMITATIONS: standing, squatting, transfers, and locomotion level  PARTICIPATION LIMITATIONS: cleaning, community activity, and occupation  PERSONAL FACTORS: Fitness and Time since onset of injury/illness/exacerbation are also affecting patient's functional outcome.  REHAB POTENTIAL: Good  CLINICAL DECISION MAKING:  Stable/uncomplicated  EVALUATION COMPLEXITY: Low   GOALS: Goals reviewed with patient? yes  SHORT TERM GOALS: Target date: 04/01/2024  Patient will be independent in self management strategies to improve quality of life and functional outcomes. Baseline: New Program Goal status: PROGRESSING   2.  Patient will report at least 50% improvement in overall symptoms and/or function to demonstrate improved functional mobility Baseline: 0% better Goal status: MET  3.  Patient will report improved awareness of body position and reduce hyperextension of the knees while standing at work Baseline: Not currently Goal status: PROGRESSING      LONG TERM GOALS: Target date: 05/08/2024  Patient will report at least 75% improvement in overall symptoms and/or function to demonstrate improved functional mobility Baseline: 0% better Goal status: MET  2.  Patient will score at least 2 points higher on PSFS average to demonstrate change in overall function. Baseline: see above Goal status: MET  3.  Patient will demonstrate pain-free manual muscle testing in lower extremities Baseline: Painful Goal status: PROGRESSING  4.  Patient will demonstrate reduced resting tone in left quad and left glutes Baseline: Increased resting tone Goal status: PROGRESSING  5.  Patient will be able to sit for at least 20 minutes at a time without pain  Baseline: painful needs to get up  Goal status: NEW  PLAN:  PT FREQUENCY: 1-2x/week for total of 12 visits over 12 weeks certification.  PT DURATION: 12 weeks PLANNED INTERVENTIONS: 97110-Therapeutic exercises, 97530- Therapeutic activity, [id number]- Neuromuscular re-education, [phone]- Self Care, [id number]- Manual therapy, [phone]- Gait training, [phone]- Orthotic Fit/training, [phone]- Canalith repositioning, [id number]- Aquatic Therapy, [id number]- Electrical stimulation (unattended), [phone]- Ionotophoresis 4mg /ml Dexamethasone , Patient/Family education, Balance training, Stair  training, Taping, Dry Needling, Joint mobilization, Joint manipulation, Spinal manipulation, Spinal mobilization, Cryotherapy, and Moist heat   PLAN FOR NEXT SESSION: HIP ER ACTIVATION - BILATERALLY   Focus on posture reduced hyperextension in knees, reduce resting tone in quads and glutes, on dry needling and soft tissue work, stretches   3:14 PM, 02/14/24 Tabitha Ewings, DPT Physical Therapy with [facility]

## 2024-02-25 ENCOUNTER — Encounter: Payer: Self-pay | Admitting: Physical Therapy

## 2024-02-25 ENCOUNTER — Ambulatory Visit: Admitting: Physical Therapy

## 2024-02-25 DIAGNOSIS — M5459 Other low back pain: Secondary | ICD-10-CM

## 2024-02-25 DIAGNOSIS — G8929 Other chronic pain: Secondary | ICD-10-CM | POA: Diagnosis not present

## 2024-02-25 DIAGNOSIS — M6281 Muscle weakness (generalized): Secondary | ICD-10-CM

## 2024-02-25 DIAGNOSIS — M25562 Pain in left knee: Secondary | ICD-10-CM

## 2024-02-25 NOTE — Therapy (Signed)
 OUTPATIENT PHYSICAL THERAPY THORACOLUMBAR TREATMENT AND RECERT   Patient Name: [name] MRN: [id number] DOB:1971/08/01, 53 y.o., female Today's Date: 02/25/2024  END OF SESSION:  PT End of Session - 02/25/24 1522     Visit Number 9    Number of Visits 20    Date for PT Re-Evaluation 05/08/24    Authorization Type VL 30 Danney Dutton    Authorization - Visit Number 9    Authorization - Number of Visits 30    Progress Note Due on Visit 19    PT Start Time 1522   late to check in   PT Stop Time 1600    PT Time Calculation (min) 38 min    Activity Tolerance Patient tolerated treatment well    Behavior During Therapy [facility] for tasks assessed/performed               Past Medical History:  Diagnosis Date   Anemia    as a child and during pregnancy   Anxiety    CIN I (cervical intraepithelial neoplasia I)    1999-LEEP, normal Paps after    Depression    Essential hypertension 05/08/2019   Patient follows with Dr. Karma Oz, PCP. Holli Lunger 07/14/21 as of 02/28/22.   Family history of premature CAD 11/20/2018   Mom   GERD (gastroesophageal reflux disease)    H/O gestational diabetes mellitus, not currently pregnant    GESTATIONAL     Headache    hx of migraines, migraine w/parathesia on 04/04/2019, MRI normal   History of melanoma 07/14/2021   Face, 20s. Sees Dr. Fleurette Humbles annually   Prolapse of anterior vaginal wall 2023   SUI (stress urinary incontinence, female) 2023   Tinnitus aurium, bilateral    since 2018, off and on   Wears glasses    reading only   Past Surgical History:  Procedure Laterality Date   AUGMENTATION MAMMAPLASTY  2005   BLADDER SUSPENSION N/A 03/06/2022   Procedure: TRANSVAGINAL TAPE (TVT) PROCEDURE;  Surgeon: Arma Lamp, MD;  Location: [facility];  Service: Gynecology;  Laterality: N/A;   BUNIONECTOMY Left 1997   CERVICAL BIOPSY  W/ LOOP ELECTRODE EXCISION  1999   CIN I   COLONOSCOPY  11/26/2020   normal   CYSTOCELE REPAIR  N/A 03/06/2022   Procedure: ANTERIOR REPAIR (CYSTOCELE);  Surgeon: Arma Lamp, MD;  Location: [facility];  Service: Gynecology;  Laterality: N/A;   CYSTOSCOPY N/A 03/06/2022   Procedure: CYSTOSCOPY;  Surgeon: Arma Lamp, MD;  Location: [facility];  Service: Gynecology;  Laterality: N/A;   ear drum surgery     at age 28   Patient Active Problem List   Diagnosis Date Noted   Primary stress urinary incontinence 07/14/2021   History of melanoma 07/14/2021   Gastroesophageal reflux disease 06/18/2019   Carpal tunnel syndrome, bilateral 06/18/2019   Essential hypertension 05/08/2019   Family history of premature CAD 11/20/2018   Allergic rhinitis 12/17/2012   Anxiety 06/27/2012    PCP: Luevenia Saha, MD  REFERRING PROVIDER: Luevenia Saha, MD  REFERRING DIAG: M54.16 (ICD-10-CM) - Left lumbar radiculopathy  Rationale for Evaluation and Treatment: Rehabilitation  THERAPY DIAG:  Other low back pain  Muscle weakness (generalized)  Chronic pain of left knee  ONSET DATE: Months  SUBJECTIVE:  SUBJECTIVE STATEMENT: 02/25/2024 States that at times her right side feels like her hip is going out of joint and it causes pain.   EVAL: Patient reports that she has had sciatica before but this pain is slightly different.  Reports her pain is in the top of her left glutes and low back and also on the top of her left kneecap.  Reports pain is worse with standing as well as at the end of the day in her knee.  Reports that the knee pain is her primary concern at this time.  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  Patient would like to reduce overall pain and improve overall  function.   PERTINENT HISTORY:  Left knee pain Bladder suspension, left bunion surgery, HTN   PAIN:  Are you having pain? Yes: NPRS scale: 1/10 Pain location: right back   Pain description: dull, achy Aggravating factors: laying down  Relieving factors: moving    PRECAUTIONS: None  RED FLAGS: None   WEIGHT BEARING RESTRICTIONS: No  FALLS:  Has patient fallen in last 6 months? Yes - trip     OCCUPATION: preschool - teacher  PLOF: Independent  PATIENT GOALS: To have less pain and to sleep better    OBJECTIVE:  Note: Objective measures were completed at Evaluation unless otherwise noted.  DIAGNOSTIC FINDINGS:  xray 10/15/23 Lumbar  IMPRESSION: 1. Minor degenerative disc disease at L2-L3 and L3-L4. Occasional facet hypertrophy. 2. Slight broad-based dextroscoliotic curvature of the lower thoracolumbar junction.  Left knee IMPRESSION: Slight medial tibiofemoral joint space narrowing.  PATIENT SURVEYS:   Patient-specific activity functional scoring scheme (Point to one number):  "0" represents "unable to perform." "10" represents "able to perform at prior level. 0 1 2 3 4 5 6 7 8 9  10 (Date and Score) Activity Initial  Activity Eval   12/12/23 02/14/24  sleep  0  8 5  Bending  6  7 8   Sitting criss cross applesauce 5 8 6    Additional Additional Total score = sum of the activity scores/number of activities Minimum detectable change (90%CI) for average score = 2 points Minimum detectable change (90%CI) for single activity score = 3 points PSFS developed by: [place] Spitz., & Binkley, J. (1995). Assessing disability and change on individual  patients: a report of a patient specific measure. Physiotherapy [place], 47, [phone]. Reproduced with the permission of the authors  Score: Eval: 3.66= 11/3  12/12/23: 23/3= 7.66 5/29: 19/3= 6.33   COGNITION: Overall cognitive status: Within functional limits for tasks  assessed     SENSATION: Not tested   POSTURE: Hyperextends knees bilaterally, anterior pelvic tilt  PALPATION: Increased resting tone and tenderness to palpation along left quad, left glutes and piriformis  LUMBAR ROM:   AROM eval 5/29  Flexion 25% limited** 25% limited**  Extension 25% limited 25% limited  Right lateral flexion  25% limited*  Left lateral flexion 25% limited* 25% limited  Right rotation    Left rotation     (Blank rows = not tested)  * pain in  back   ** pain in both knees    LE Measurements Lower Extremity Right 5/29 Left 5/29   A/PROM MMT A/PROM MMT  Hip Flexion  4-*  4-*  Hip Extension  4 -  4 -  Hip Abduction      Hip Adduction      Hip Internal  rotation [facility]  [facility]   Hip External rotation [facility]  WFL   Knee Flexion WFL 4+ WFL 4*  Knee Extension Excessive 5 4+ Excessive 5 4+  Ankle Dorsiflexion  4+  4+  Ankle Plantarflexion      Ankle Inversion      Ankle Eversion       (Blank rows = not tested) * pain     TREATMENT DATE:                                                                                                                               02/25/2024  Therapeutic Exercise:  Review of HEP     Supine: bridges x15 5" holds,  thomas stretch x3 30" holds B S/l clamshells 2x10 B, reverse clams 2x10 B Seated :  child's pose x5 5" holds L/R/C, goddess pose x15 5" holds Standing: plank at counter 60" holds, mountain climbers 3x5 5" B  Neuromuscular Re-education:   Manual Therapy:   Therapeutic Activity: Self Care: Trigger Point Dry Needling:  Modalities:     PATIENT EDUCATION:  Education details: on HEP  Person educated: Patient Education method: Programmer, multimedia, Facilities manager, and Handouts Education comprehension: verbalized understanding   HOME EXERCISE PROGRAM: [id number] Long exhale breathing Posterior lower rib breathing   ASSESSMENT:  CLINICAL IMPRESSION: 02/25/2024 Focused on strengthening exercises which were tolerated  well. Fatigue in core but no pain noted during or after session. Added new exercises to HEP. Overall patient doing well and would continue to benefit from skilled PT at this time.   EVAL: Patient presents to physical therapy with complaints of left low back and left knee pain that started a while ago.  Patient with increased resting tone in left quadriceps and left glutes muscles.  Patient also demonstrates hyperextension bilateral knees with all standing postures and positions and presents with sacral sitting and seated posture.  Educated patient in current presentation as well as postural contributions to current pain presentation.  Patient would greatly benefit from skilled PT to address physical impairments and improve overall active postures to relieve stress on joints and improve overall quality of life.  OBJECTIVE IMPAIRMENTS: decreased activity tolerance, difficulty walking, decreased ROM, decreased strength, improper body mechanics, postural dysfunction, and pain.   ACTIVITY LIMITATIONS: standing, squatting, transfers, and locomotion level  PARTICIPATION LIMITATIONS: cleaning, community activity, and occupation  PERSONAL FACTORS: Fitness and Time since onset of injury/illness/exacerbation are also affecting patient's functional outcome.   REHAB POTENTIAL: Good  CLINICAL DECISION MAKING: Stable/uncomplicated  EVALUATION COMPLEXITY: Low   GOALS: Goals reviewed with patient? yes  SHORT TERM GOALS: Target date: 04/01/2024  Patient will be independent in self management strategies to improve quality of life and functional outcomes. Baseline: New Program Goal status: PROGRESSING   2.  Patient will report at least 50% improvement in overall symptoms and/or function to demonstrate improved functional mobility Baseline: 0% better Goal status: MET  3.  Patient will report improved awareness of  body position and reduce hyperextension of the knees while standing at work Baseline: Not  currently Goal status: PROGRESSING      LONG TERM GOALS: Target date: 05/08/2024  Patient will report at least 75% improvement in overall symptoms and/or function to demonstrate improved functional mobility Baseline: 0% better Goal status: MET  2.  Patient will score at least 2 points higher on PSFS average to demonstrate change in overall function. Baseline: see above Goal status: MET  3.  Patient will demonstrate pain-free manual muscle testing in lower extremities Baseline: Painful Goal status: PROGRESSING  4.  Patient will demonstrate reduced resting tone in left quad and left glutes Baseline: Increased resting tone Goal status: PROGRESSING  5.  Patient will be able to sit for at least 20 minutes at a time without pain  Baseline: painful needs to get up  Goal status: NEW  PLAN:  PT FREQUENCY: 1-2x/week for total of 12 visits over 12 weeks certification.  PT DURATION: 12 weeks PLANNED INTERVENTIONS: 97110-Therapeutic exercises, 97530- Therapeutic activity, [id number]- Neuromuscular re-education, [phone]- Self Care, 60454- Manual therapy, [phone]- Gait training, [phone]- Orthotic Fit/training, [phone]- Canalith repositioning, [id number]- Aquatic Therapy, [id number]- Electrical stimulation (unattended), [phone]- Ionotophoresis 4mg /ml Dexamethasone , Patient/Family education, Balance training, Stair training, Taping, Dry Needling, Joint mobilization, Joint manipulation, Spinal manipulation, Spinal mobilization, Cryotherapy, and Moist heat   PLAN FOR NEXT SESSION: HIP ER ACTIVATION - BILATERALLY   Focus on posture reduced hyperextension in knees, reduce resting tone in quads and glutes, on dry needling and soft tissue work, stretches   4:01 PM, 02/25/24 Tabitha Ewings, DPT Physical Therapy with [facility]

## 2024-02-28 ENCOUNTER — Encounter: Payer: Self-pay | Admitting: Physical Therapy

## 2024-02-28 ENCOUNTER — Ambulatory Visit: Admitting: Physical Therapy

## 2024-02-28 DIAGNOSIS — G8929 Other chronic pain: Secondary | ICD-10-CM | POA: Diagnosis not present

## 2024-02-28 DIAGNOSIS — M6281 Muscle weakness (generalized): Secondary | ICD-10-CM | POA: Diagnosis not present

## 2024-02-28 DIAGNOSIS — M25562 Pain in left knee: Secondary | ICD-10-CM

## 2024-02-28 DIAGNOSIS — M5459 Other low back pain: Secondary | ICD-10-CM | POA: Diagnosis not present

## 2024-02-28 NOTE — Therapy (Signed)
 OUTPATIENT PHYSICAL THERAPY THORACOLUMBAR TREATMENT    Patient Name: [name] MRN: [id number] DOB:02-Jan-1971, 53 y.o., female Today's Date: 02/28/2024  END OF SESSION:  PT End of Session - 02/28/24 1300     Visit Number 10    Number of Visits 20    Date for PT Re-Evaluation 05/08/24    Authorization Type VL 30 Danney Dutton    Authorization - Visit Number 10    Authorization - Number of Visits 30    Progress Note Due on Visit 19    PT Start Time 1302    PT Stop Time 1340    PT Time Calculation (min) 38 min    Activity Tolerance Patient tolerated treatment well    Behavior During Therapy WFL for tasks assessed/performed            Past Medical History:  Diagnosis Date   Anemia    as a child and during pregnancy   Anxiety    CIN I (cervical intraepithelial neoplasia I)    1999-LEEP, normal Paps after    Depression    Essential hypertension 05/08/2019   Patient follows with Dr. Karma Oz, PCP. Holli Lunger 07/14/21 as of 02/28/22.   Family history of premature CAD 11/20/2018   Mom   GERD (gastroesophageal reflux disease)    H/O gestational diabetes mellitus, not currently pregnant    GESTATIONAL     Headache    hx of migraines, migraine w/parathesia on 04/04/2019, MRI normal   History of melanoma 07/14/2021   Face, 20s. Sees Dr. Fleurette Humbles annually   Prolapse of anterior vaginal wall 2023   SUI (stress urinary incontinence, female) 2023   Tinnitus aurium, bilateral    since 2018, off and on   Wears glasses    reading only   Past Surgical History:  Procedure Laterality Date   AUGMENTATION MAMMAPLASTY  2005   BLADDER SUSPENSION N/A 03/06/2022   Procedure: TRANSVAGINAL TAPE (TVT) PROCEDURE;  Surgeon: Arma Lamp, MD;  Location: [facility];  Service: Gynecology;  Laterality: N/A;   BUNIONECTOMY Left 1997   CERVICAL BIOPSY  W/ LOOP ELECTRODE EXCISION  1999   CIN I   COLONOSCOPY  11/26/2020   normal   CYSTOCELE REPAIR N/A 03/06/2022   Procedure:  ANTERIOR REPAIR (CYSTOCELE);  Surgeon: Arma Lamp, MD;  Location: [facility];  Service: Gynecology;  Laterality: N/A;   CYSTOSCOPY N/A 03/06/2022   Procedure: CYSTOSCOPY;  Surgeon: Arma Lamp, MD;  Location: [facility];  Service: Gynecology;  Laterality: N/A;   ear drum surgery     at age 46   Patient Active Problem List   Diagnosis Date Noted   Primary stress urinary incontinence 07/14/2021   History of melanoma 07/14/2021   Gastroesophageal reflux disease 06/18/2019   Carpal tunnel syndrome, bilateral 06/18/2019   Essential hypertension 05/08/2019   Family history of premature CAD 11/20/2018   Allergic rhinitis 12/17/2012   Anxiety 06/27/2012    PCP: Luevenia Saha, MD  REFERRING PROVIDER: Luevenia Saha, MD  REFERRING DIAG: M54.16 (ICD-10-CM) - Left lumbar radiculopathy  Rationale for Evaluation and Treatment: Rehabilitation  THERAPY DIAG:  Other low back pain  Muscle weakness (generalized)  Chronic pain of left knee  ONSET DATE: Months  SUBJECTIVE:  SUBJECTIVE STATEMENT: 02/28/2024 States that her front left side bothers her at night now and it is keeping her up.   EVAL: Patient reports that she has had sciatica before but this pain is slightly different.  Reports her pain is in the top of her left glutes and low back and also on the top of her left kneecap.  Reports pain is worse with standing as well as at the end of the day in her knee.  Reports that the knee pain is her primary concern at this time.  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  Patient would like to reduce overall pain and improve overall function.   PERTINENT HISTORY:  Left knee  pain Bladder suspension, left bunion surgery, HTN   PAIN:  Are you having pain? Yes: NPRS scale: 3/10 Pain location: left low back    Pain description: dull, achy Aggravating factors: laying down  Relieving factors: moving    PRECAUTIONS: None  RED FLAGS: None   WEIGHT BEARING RESTRICTIONS: No  FALLS:  Has patient fallen in last 6 months? Yes - trip     OCCUPATION: preschool - teacher  PLOF: Independent  PATIENT GOALS: To have less pain and to sleep better    OBJECTIVE:  Note: Objective measures were completed at Evaluation unless otherwise noted.  DIAGNOSTIC FINDINGS:  xray 10/15/23 Lumbar  IMPRESSION: 1. Minor degenerative disc disease at L2-L3 and L3-L4. Occasional facet hypertrophy. 2. Slight broad-based dextroscoliotic curvature of the lower thoracolumbar junction.  Left knee IMPRESSION: Slight medial tibiofemoral joint space narrowing.  PATIENT SURVEYS:   Patient-specific activity functional scoring scheme (Point to one number):  0 represents "unable to perform." 10 represents "able to perform at prior level. 0 1 2 3 4 5 6 7 8 9  10 (Date and Score) Activity Initial  Activity Eval   12/12/23 02/14/24  sleep  0  8 5  Bending  6  7 8   Sitting criss cross applesauce 5 8 6    Additional Additional Total score = sum of the activity scores/number of activities Minimum detectable change (90%CI) for average score = 2 points Minimum detectable change (90%CI) for single activity score = 3 points PSFS developed by: [place] Spitz., & Binkley, J. (1995). Assessing disability and change on individual  patients: a report of a patient specific measure. Physiotherapy [place], 47, [phone]. Reproduced with the permission of the authors  Score: Eval: 3.66= 11/3  12/12/23: 23/3= 7.66 5/29: 19/3= 6.33   COGNITION: Overall cognitive status: Within functional limits for tasks assessed     SENSATION: Not tested   POSTURE: Hyperextends  knees bilaterally, anterior pelvic tilt  PALPATION: Increased resting tone and tenderness to palpation along left quad, left glutes and piriformis  LUMBAR ROM:   AROM eval 5/29  Flexion 25% limited** 25% limited**  Extension 25% limited 25% limited  Right lateral flexion  25% limited*  Left lateral flexion 25% limited* 25% limited  Right rotation    Left rotation     (Blank rows = not tested)  * pain in  back   ** pain in both knees    LE Measurements Lower Extremity Right 5/29 Left 5/29   A/PROM MMT A/PROM MMT  Hip Flexion  4-*  4-*  Hip Extension  4 -  4 -  Hip Abduction      Hip Adduction      Hip Internal rotation  [facility]  WFL   Hip External rotation [facility]  WFL   Knee Flexion WFL 4+ WFL 4*  Knee Extension Excessive 5 4+ Excessive 5 4+  Ankle Dorsiflexion  4+  4+  Ankle Plantarflexion      Ankle Inversion      Ankle Eversion       (Blank rows = not tested) * pain     TREATMENT DATE:                                                                                                                               02/28/2024  Therapeutic Exercise:  Review of HEP    Prone: lying 5 minutes, hamstring curls x5 10 holds B, hip IR/ER - 2 minutes Quad: cat cow 2 minutes Supine: thomas stretch x2 60 holds bridges x15 5 holds then with red band 3x5 5 holds, hip IR x10 B    S/l clamshells x10 --> then with red band 2x10  B, reverse clams 2x10 B  Seated :   piriformis stretch x3 30 holds B Standing:    Neuromuscular Re-education:  sleeping postures and positions with pillow to reduce stress on low back/pelvis.5 minutes Manual Therapy:   Therapeutic Activity: Self Care: Trigger Point Dry Needling:  Modalities:     PATIENT EDUCATION:  Education details: on HEP  Person educated: Patient Education method: Programmer, multimedia, Facilities manager, and Handouts Education comprehension: verbalized understanding   HOME EXERCISE PROGRAM: [id number] Long exhale breathing Posterior  lower rib breathing   ASSESSMENT:  CLINICAL IMPRESSION: 02/28/2024  Started with prone exercises to help with low back pain, these exercises abolished symptoms. Reviewed sleeping postures to reduce stress on left hip. Added new exercises to HEP, overall patient doing well and will continue to benefit from skilled PT. Soreness in hip muscles noted end of session.    EVAL: Patient presents to physical therapy with complaints of left low back and left knee pain that started a while ago.  Patient with increased resting tone in left quadriceps and left glutes muscles.  Patient also demonstrates hyperextension bilateral knees with all standing postures and positions and presents with sacral sitting and seated posture.  Educated patient in current presentation as well as postural contributions to current pain presentation.  Patient would greatly benefit from skilled PT to address physical impairments and improve overall active postures to relieve stress on joints and improve overall quality of life.  OBJECTIVE IMPAIRMENTS: decreased activity tolerance, difficulty walking, decreased ROM, decreased strength, improper body mechanics, postural dysfunction, and pain.   ACTIVITY LIMITATIONS: standing, squatting, transfers, and locomotion level  PARTICIPATION LIMITATIONS: cleaning, community activity, and occupation  PERSONAL FACTORS: Fitness and Time since onset of injury/illness/exacerbation are also affecting patient's functional outcome.   REHAB POTENTIAL: Good  CLINICAL DECISION MAKING: Stable/uncomplicated  EVALUATION COMPLEXITY: Low   GOALS: Goals reviewed with patient? yes  SHORT TERM GOALS: Target date: 04/01/2024  Patient will be independent in self management strategies to  improve quality of life and functional outcomes. Baseline: New Program Goal status: PROGRESSING   2.  Patient will report at least 50% improvement in overall symptoms and/or function to demonstrate improved functional  mobility Baseline: 0% better Goal status: MET  3.  Patient will report improved awareness of body position and reduce hyperextension of the knees while standing at work Baseline: Not currently Goal status: PROGRESSING      LONG TERM GOALS: Target date: 05/08/2024  Patient will report at least 75% improvement in overall symptoms and/or function to demonstrate improved functional mobility Baseline: 0% better Goal status: MET  2.  Patient will score at least 2 points higher on PSFS average to demonstrate change in overall function. Baseline: see above Goal status: MET  3.  Patient will demonstrate pain-free manual muscle testing in lower extremities Baseline: Painful Goal status: PROGRESSING  4.  Patient will demonstrate reduced resting tone in left quad and left glutes Baseline: Increased resting tone Goal status: PROGRESSING  5.  Patient will be able to sit for at least 20 minutes at a time without pain  Baseline: painful needs to get up  Goal status: NEW  PLAN:  PT FREQUENCY: 1-2x/week for total of 12 visits over 12 weeks certification.  PT DURATION: 12 weeks PLANNED INTERVENTIONS: 97110-Therapeutic exercises, 97530- Therapeutic activity, [id number]- Neuromuscular re-education, [phone]- Self Care, 40981- Manual therapy, [phone]- Gait training, [phone]- Orthotic Fit/training, [phone]- Canalith repositioning, [id number]- Aquatic Therapy, [id number]- Electrical stimulation (unattended), [phone]- Ionotophoresis 4mg /ml Dexamethasone , Patient/Family education, Balance training, Stair training, Taping, Dry Needling, Joint mobilization, Joint manipulation, Spinal manipulation, Spinal mobilization, Cryotherapy, and Moist heat   PLAN FOR NEXT SESSION: HIP ER ACTIVATION - BILATERALLY   Focus on posture reduced hyperextension in knees, reduce resting tone in quads and glutes, on dry needling and soft tissue work, stretches   1:44 PM, 02/28/24 Tabitha Ewings, DPT Physical Therapy with [place]

## 2024-03-03 ENCOUNTER — Encounter: Payer: Self-pay | Admitting: Physical Therapy

## 2024-03-03 ENCOUNTER — Ambulatory Visit: Admitting: Physical Therapy

## 2024-03-03 DIAGNOSIS — M6281 Muscle weakness (generalized): Secondary | ICD-10-CM | POA: Diagnosis not present

## 2024-03-03 DIAGNOSIS — G8929 Other chronic pain: Secondary | ICD-10-CM | POA: Diagnosis not present

## 2024-03-03 DIAGNOSIS — M25562 Pain in left knee: Secondary | ICD-10-CM | POA: Diagnosis not present

## 2024-03-03 DIAGNOSIS — M5459 Other low back pain: Secondary | ICD-10-CM | POA: Diagnosis not present

## 2024-03-03 NOTE — Therapy (Addendum)
 OUTPATIENT PHYSICAL THERAPY THORACOLUMBAR TREATMENT  PHYSICAL THERAPY DISCHARGE SUMMARY  Visits from Start of Care: 11  Current functional level related to goals / functional outcomes: See below   Remaining deficits: See below   Education / Equipment: See below   Patient agrees to discharge. Patient goals were partially met. Patient is being discharged due to not returning since the last visit.  2:51 PM, 03/19/24 Olivia Church, DPT Physical Therapy with     Patient Name: [name] MRN: [id number] DOB:08-Jul-1971, 53 y.o., female Today's Date: 03/03/2024  END OF SESSION:  PT End of Session - 03/03/24 1517     Visit Number 11    Number of Visits 20    Date for PT Re-Evaluation 05/08/24    Authorization Type VL 30 [place]    Authorization - Visit Number 11    Authorization - Number of Visits 30    Progress Note Due on Visit 19    PT Start Time 1518    PT Stop Time 1557    PT Time Calculation (min) 39 min    Activity Tolerance Patient tolerated treatment well    Behavior During Therapy WFL for tasks assessed/performed            Past Medical History:  Diagnosis Date   Anemia    as a child and during pregnancy   Anxiety    CIN I (cervical intraepithelial neoplasia I)    1999-LEEP, normal Paps after    Depression    Essential hypertension 05/08/2019   Patient follows with Dr. Lavern Heck, PCP. ARNETTA 07/14/21 as of 02/28/22.   Family history of premature CAD 11/20/2018   Mom   GERD (gastroesophageal reflux disease)    H/O gestational diabetes mellitus, not currently pregnant    GESTATIONAL     Headache    hx of migraines, migraine w/parathesia on 04/04/2019, MRI normal   History of melanoma 07/14/2021   Face, 20s. Sees Dr. Ivin annually   Prolapse of anterior vaginal wall 2023   SUI (stress urinary incontinence, female) 2023   Tinnitus aurium, bilateral    since 2018, off and on   Wears glasses    reading only   Past Surgical History:   Procedure Laterality Date   AUGMENTATION MAMMAPLASTY  2005   BLADDER SUSPENSION N/A 03/06/2022   Procedure: TRANSVAGINAL TAPE (TVT) PROCEDURE;  Surgeon: Marilynne Rosaline SAILOR, MD;  Location: [facility];  Service: Gynecology;  Laterality: N/A;   BUNIONECTOMY Left 1997   CERVICAL BIOPSY  W/ LOOP ELECTRODE EXCISION  1999   CIN I   COLONOSCOPY  11/26/2020   normal   CYSTOCELE REPAIR N/A 03/06/2022   Procedure: ANTERIOR REPAIR (CYSTOCELE);  Surgeon: Marilynne Rosaline SAILOR, MD;  Location: [facility];  Service: Gynecology;  Laterality: N/A;   CYSTOSCOPY N/A 03/06/2022   Procedure: CYSTOSCOPY;  Surgeon: Marilynne Rosaline SAILOR, MD;  Location: [facility];  Service: Gynecology;  Laterality: N/A;   ear drum surgery     at age 55   Patient Active Problem List   Diagnosis Date Noted   Primary stress urinary incontinence 07/14/2021   History of melanoma 07/14/2021   Gastroesophageal reflux disease 06/18/2019   Carpal tunnel syndrome, bilateral 06/18/2019   Essential hypertension 05/08/2019   Family history of premature CAD 11/20/2018   Allergic rhinitis 12/17/2012   Anxiety 06/27/2012    PCP: Heck Lavern CROME, MD  REFERRING PROVIDER: Heck Lavern CROME, MD  REFERRING DIAG: M54.16 (ICD-10-CM) - Left  lumbar radiculopathy  Rationale for Evaluation and Treatment: Rehabilitation  THERAPY DIAG:  Other low back pain  Muscle weakness (generalized)  Chronic pain of left knee  ONSET DATE: Months  SUBJECTIVE:                                                                                                                                                                                           SUBJECTIVE STATEMENT: 03/03/2024 States se continues to have left low back pain and hip pain at night.   EVAL: Patient reports that she has had sciatica before but this pain is slightly different.  Reports her pain is in the top of her left glutes and low back and  also on the top of her left kneecap.  Reports pain is worse with standing as well as at the end of the day in her knee.  Reports that the knee pain is her primary concern at this time.  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  Patient would like to reduce overall pain and improve overall function.   PERTINENT HISTORY:  Left knee pain Bladder suspension, left bunion surgery, HTN   PAIN:  Are you having pain? Yes: NPRS scale: 3/10 Pain location: left low back    Pain description: dull, achy Aggravating factors: laying down  Relieving factors: moving    PRECAUTIONS: None  RED FLAGS: None   WEIGHT BEARING RESTRICTIONS: No  FALLS:  Has patient fallen in last 6 months? Yes - trip     OCCUPATION: preschool - teacher  PLOF: Independent  PATIENT GOALS: To have less pain and to sleep better    OBJECTIVE:  Note: Objective measures were completed at Evaluation unless otherwise noted.  DIAGNOSTIC FINDINGS:  xray 10/15/23 Lumbar  IMPRESSION: 1. Minor degenerative disc disease at L2-L3 and L3-L4. Occasional facet hypertrophy. 2. Slight broad-based dextroscoliotic curvature of the lower thoracolumbar junction.  Left knee IMPRESSION: Slight medial tibiofemoral joint space narrowing.  PATIENT SURVEYS:   Patient-specific activity functional scoring scheme (Point to one number):  0 represents "unable to perform." 10 represents "able to perform at prior level. 0 1 2 3 4 5 6 7 8 9  10 (Date and Score) Activity Initial  Activity Eval   12/12/23 02/14/24  sleep  0  8 5  Bending  6  7 8   Sitting criss cross applesauce 5 8 6    Additional Additional Total score = sum of the activity scores/number of activities Minimum detectable change (90%CI) for average score = 2 points Minimum detectable change (90%CI) for single activity  score = 3 points PSFS developed by: Rosalee MYRTIS Marvis KYM Charlet CHRISTELLA.,  & Waylon PARAS. (1995). Assessing disability and change on individual  patients: a report of a patient specific measure. Physiotherapy [place], 47, [phone]. Reproduced with the permission of the authors  Score: Eval: 3.66= 11/3  12/12/23: 23/3= 7.66 5/29: 19/3= 6.33   COGNITION: Overall cognitive status: Within functional limits for tasks assessed     SENSATION: Not tested   POSTURE: Hyperextends knees bilaterally, anterior pelvic tilt  PALPATION: Increased resting tone and tenderness to palpation along left quad, left glutes and piriformis  LUMBAR ROM:   AROM eval 5/29  Flexion 25% limited** 25% limited**  Extension 25% limited 25% limited  Right lateral flexion  25% limited*  Left lateral flexion 25% limited* 25% limited  Right rotation    Left rotation     (Blank rows = not tested)  * pain in  back   ** pain in both knees    LE Measurements Lower Extremity Right 5/29 Left 5/29   A/PROM MMT A/PROM MMT  Hip Flexion  4-*  4-*  Hip Extension  4 -  4 -  Hip Abduction      Hip Adduction      Hip Internal rotation [facility]   Hip External rotation [facility]  WFL   Knee Flexion WFL 4+ WFL 4*  Knee Extension Excessive 5 4+ Excessive 5 4+  Ankle Dorsiflexion  4+  4+  Ankle Plantarflexion      Ankle Inversion      Ankle Eversion       (Blank rows = not tested) * pain     TREATMENT DATE:                                                                                                                               03/03/2024  Therapeutic Exercise:  Review of HEP    Prone: PROM of left hip with over pressure for stretching Supine: thomas stretch x2 60 holds, piriformis stretch IR x3 30 B, bridges 2x10 5 holds with posterior pelvic tilt  Seated :   piriformis stretch x3 30 holds B, hip IR 3x5 5 holds B, hip add  iso 2 minutes 5  holds Standing:  ITB stretch 10 holds x5, self mobilization to left glutes - tolerated well - at well  Neuromuscular Re-education:    Manual Therapy:  PA to lumbar spine, STM to left lumbar and hip musculature and long axis traction to left hip with and without ROM. Therapeutic Activity: Self Care: Trigger Point Dry Needling:  Modalities:     PATIENT EDUCATION:  Education details: on HEP  Person educated: Patient Education method: Explanation, Facilities manager, and Handouts Education comprehension: verbalized understanding   HOME EXERCISE PROGRAM: [id number] Long exhale breathing Posterior lower rib breathing   ASSESSMENT:  CLINICAL IMPRESSION: 03/03/2024 Focused on left hip pain. Tolerated adduction stretch well with reduced glute tension noted afterwards. Mimicked  this passive stretch with an ITB stretch performed by patient. Reviewed self mobilization to this area with tennis ball. Less symptoms and tension noted in left hip after this series of interventions. Adde hip IR activation in sitting which targeted tight muscle area. Reduced pain noted end of session. Added new exercises to HEP. Will continue with current POC as tolerated.    EVAL: Patient presents to physical therapy with complaints of left low back and left knee pain that started a while ago.  Patient with increased resting tone in left quadriceps and left glutes muscles.  Patient also demonstrates hyperextension bilateral knees with all standing postures and positions and presents with sacral sitting and seated posture.  Educated patient in current presentation as well as postural contributions to current pain presentation.  Patient would greatly benefit from skilled PT to address physical impairments and improve overall active postures to relieve stress on joints and improve overall quality of life.  OBJECTIVE IMPAIRMENTS: decreased activity tolerance, difficulty walking, decreased ROM, decreased strength, improper body mechanics, postural dysfunction, and pain.   ACTIVITY LIMITATIONS: standing, squatting, transfers, and locomotion  level  PARTICIPATION LIMITATIONS: cleaning, community activity, and occupation  PERSONAL FACTORS: Fitness and Time since onset of injury/illness/exacerbation are also affecting patient's functional outcome.   REHAB POTENTIAL: Good  CLINICAL DECISION MAKING: Stable/uncomplicated  EVALUATION COMPLEXITY: Low   GOALS: Goals reviewed with patient? yes  SHORT TERM GOALS: Target date: 04/01/2024  Patient will be independent in self management strategies to improve quality of life and functional outcomes. Baseline: New Program Goal status: PROGRESSING   2.  Patient will report at least 50% improvement in overall symptoms and/or function to demonstrate improved functional mobility Baseline: 0% better Goal status: MET  3.  Patient will report improved awareness of body position and reduce hyperextension of the knees while standing at work Baseline: Not currently Goal status: PROGRESSING      LONG TERM GOALS: Target date: 05/08/2024  Patient will report at least 75% improvement in overall symptoms and/or function to demonstrate improved functional mobility Baseline: 0% better Goal status: MET  2.  Patient will score at least 2 points higher on PSFS average to demonstrate change in overall function. Baseline: see above Goal status: MET  3.  Patient will demonstrate pain-free manual muscle testing in lower extremities Baseline: Painful Goal status: PROGRESSING  4.  Patient will demonstrate reduced resting tone in left quad and left glutes Baseline: Increased resting tone Goal status: PROGRESSING  5.  Patient will be able to sit for at least 20 minutes at a time without pain  Baseline: painful needs to get up  Goal status: NEW  PLAN:  PT FREQUENCY: 1-2x/week for total of 12 visits over 12 weeks certification.  PT DURATION: 12 weeks PLANNED INTERVENTIONS: 97110-Therapeutic exercises, 97530- Therapeutic activity, [id number]- Neuromuscular re-education, [phone]- Self Care, [id number]-  Manual therapy, [phone]- Gait training, [phone]- Orthotic Fit/training, [phone]- Canalith repositioning, [id number]- Aquatic Therapy, [id number]- Electrical stimulation (unattended), [phone]- Ionotophoresis 4mg /ml Dexamethasone , Patient/Family education, Balance training, Stair training, Taping, Dry Needling, Joint mobilization, Joint manipulation, Spinal manipulation, Spinal mobilization, Cryotherapy, and Moist heat   PLAN FOR NEXT SESSION: HIP ER ACTIVATION - BILATERALLY   Focus on posture reduced hyperextension in knees, reduce resting tone in quads and glutes, on dry needling and soft tissue work, stretches   4:00 PM, 03/03/24 Olivia Church, DPT Physical Therapy with [place]

## 2024-03-06 ENCOUNTER — Encounter: Admitting: Physical Therapy

## 2024-03-18 ENCOUNTER — Ambulatory Visit: Payer: Self-pay | Admitting: Nurse Practitioner

## 2024-03-18 ENCOUNTER — Encounter: Payer: Self-pay | Admitting: Nurse Practitioner

## 2024-03-18 ENCOUNTER — Other Ambulatory Visit (HOSPITAL_COMMUNITY)
Admission: RE | Admit: 2024-03-18 | Discharge: 2024-03-18 | Disposition: A | Discharge status: Home / Self Care | Source: Ambulatory Visit | Attending: Nurse Practitioner | Admitting: Nurse Practitioner

## 2024-03-18 ENCOUNTER — Ambulatory Visit (INDEPENDENT_AMBULATORY_CARE_PROVIDER_SITE_OTHER): Admitting: Nurse Practitioner

## 2024-03-18 VITALS — BP 118/80 | HR 77

## 2024-03-18 DIAGNOSIS — Z01419 Encounter for gynecological examination (general) (routine) without abnormal findings: Secondary | ICD-10-CM

## 2024-03-18 DIAGNOSIS — Z1331 Encounter for screening for depression: Secondary | ICD-10-CM | POA: Diagnosis not present

## 2024-03-18 DIAGNOSIS — Z124 Encounter for screening for malignant neoplasm of cervix: Secondary | ICD-10-CM | POA: Diagnosis present

## 2024-03-18 DIAGNOSIS — N951 Menopausal and female climacteric states: Secondary | ICD-10-CM

## 2024-03-18 NOTE — Progress Notes (Signed)
 [name] Riffel [birth year] [id number]   History:  53 y.o. [id number] presents for annual exam. LMP December 2024. Having some mild hot flashes and irritability. 1999 LEEP CIN-1, subsequent paps normal. Normal mammogram history. 02/2022 anterior repair with sling for cystocele. HTN managed by PCP.   Gynecologic History Patient's last menstrual period was 09/03/2023.   Contraception/Family planning: vasectomy Sexually active: Yes  Health Maintenance Last Pap: 10/08/2018. Results were: Normal neg HPV Last mammogram: 2025. Results were: Normal per patient Last colonoscopy: 11/26/2020. Results were: Normal, 10-year recall Last Dexa: Not indicated  Past medical history, past surgical history, family history and social history were all reviewed and documented in the EPIC chart. Married. Works at preschool. 76 yo son, has 64 mo son. 34 yo daughter.   ROS:  A ROS was performed and pertinent positives and negatives are included.  Exam:  Vitals:   03/18/24 0831 03/18/24 0841  BP: (!) 130/90 118/80  Pulse: 77   SpO2: 98%       There is no height or weight on file to calculate BMI.  General appearance:  Normal Thyroid:  Symmetrical, normal in size, without palpable masses or nodularity. Respiratory  Auscultation:  Clear without wheezing or rhonchi Cardiovascular  Auscultation:  Regular rate, without rubs, murmurs or gallops  Edema/varicosities:  Not grossly evident Abdominal  Soft,nontender, without masses, guarding or rebound.  Liver/spleen:  No organomegaly noted  Hernia:  None appreciated  Skin  Inspection:  Grossly normal Breasts: Examined lying and sitting. Bilateral implants noted.   Right: Without masses, retractions, nipple discharge or axillary adenopathy.   Left: Without masses, retractions, nipple discharge or axillary adenopathy. Pelvic: External genitalia:  no lesions              Urethra:  normal appearing urethra with no masses, tenderness or lesions               Bartholins and Skenes: normal                 Vagina: normal appearing vagina with normal color and discharge, no lesions              Cervix: no lesions Bimanual Exam:  Uterus:  no masses or tenderness              Adnexa: no mass, fullness, tenderness              Rectovaginal: Deferred              Anus:  normal, no lesions  Patient informed chaperone available to be present for breast and pelvic exam. Patient has requested no chaperone to be present. Patient has been advised what will be completed during breast and pelvic exam.   Assessment/Plan:  53 y.o. [id number] for annual exam.   Well female exam with routine gynecological exam - Education provided on SBEs, importance of preventative screenings, current guidelines, high calcium diet, regular exercise, and multivitamin daily. Labs with PCP.   Cervical cancer screening - Plan: Cytology - PAP( [facility]). 1999 LEEP CIN-1, subsequent paps normal.    Perimenopausal - LMP December 2024. Having some mild hot flashes and irritability. OTC supplements provided.   Screening for breast cancer - Normal mammogram history.  Continue annual screenings.  Normal breast exam today.  Screening for colon cancer - Normal colonoscopy 11/2020, will repeat at 10-year interval per GI recommendation.   Screening for osteoporosis - Average risk. Will plan for DXA at age 10. Recommend increasing exercise.  Return in about 1 year (around 03/18/2025) for Annual.     Veronica DELENA Shutter DNP, 9:34 AM 03/18/2024

## 2024-03-20 LAB — CYTOLOGY - PAP
Comment: NEGATIVE
Diagnosis: NEGATIVE
High risk HPV: NEGATIVE

## 2024-03-24 ENCOUNTER — Ambulatory Visit: Payer: Self-pay | Admitting: Nurse Practitioner

## 2024-04-30 ENCOUNTER — Ambulatory Visit: Payer: Self-pay | Admitting: Nurse Practitioner

## 2024-05-08 ENCOUNTER — Encounter: Payer: Self-pay | Admitting: Physician Assistant

## 2024-05-08 ENCOUNTER — Ambulatory Visit: Admitting: Physician Assistant

## 2024-05-08 VITALS — BP 130/80 | HR 105 | Temp 97.7°F | Ht 62.5 in | Wt 150.4 lb

## 2024-05-08 DIAGNOSIS — R509 Fever, unspecified: Secondary | ICD-10-CM

## 2024-05-08 LAB — URINALYSIS, ROUTINE W REFLEX MICROSCOPIC
Bilirubin Urine: NEGATIVE
Ketones, ur: NEGATIVE
Nitrite: NEGATIVE
Specific Gravity, Urine: 1.015 (ref 1.000–1.030)
Total Protein, Urine: NEGATIVE
Urine Glucose: NEGATIVE
Urobilinogen, UA: 0.2 (ref 0.0–1.0)
pH: 7 (ref 5.0–8.0)

## 2024-05-08 LAB — POC COVID19 BINAXNOW: SARS Coronavirus 2 Ag: NEGATIVE

## 2024-05-08 LAB — POCT RAPID STREP A (OFFICE): Rapid Strep A Screen: NEGATIVE

## 2024-05-08 MED ORDER — AZELASTINE HCL 0.1 % NA SOLN: 1.0000 | Freq: Two times a day (BID) | NASAL | 12 refills | Status: AC

## 2024-05-08 MED ORDER — AMOXICILLIN-POT CLAVULANATE 875-125 MG PO TABS: 1.0000 | ORAL_TABLET | Freq: Two times a day (BID) | ORAL | 0 refills | Status: DC

## 2024-05-08 NOTE — Patient Instructions (Signed)
 It was great to see you!  Trial astelin  nasal spray instead of Flonase  Start antibiotic(s)  Push fluids  We will be in touch with urine results  Take care,  Lucie Buttner PA-C

## 2024-05-08 NOTE — Progress Notes (Signed)
 [name] is a 53 y.o. female here for a follow up of a pre-existing problem.  History of Present Illness:   Chief Complaint  Patient presents with   Otalgia    Pt c/o right pain, drainage x 3 weeks and low grade fever 101 started 2 days ago. Also c/o sinus congestion and body aches.    Discussed the use of AI scribe software for clinical note transcription with the patient, who gave verbal consent to proceed.  History of Present Illness [name] is a 53 year old female who presents with right ear pain, sore throat, and fever.  She experiences right ear pain with significant drainage, initially thought to be swimmer's ear. She has a history of ear surgery in left ear. Over the past three days, she developed chills and fever, reaching 101F, which subsided with Motrin . She has a mild cough. Her throat pain is severe, especially on the right side, with a sensation of a foreign body. Swallowing is possible but uncomfortable. She feels fatigued with body aches and chills, affecting her daily activities. No abdominal pain, diarrhea, or skin infections. No recent travel or illness in the household.  She does report that 1-2 years ago she had an awful urinary tract infection (UTI) that required emergency room evaluation and her only symptom(s) were fever, chills, body aches. Reports there is no urinary symptom(s) today.    Past Medical History:  Diagnosis Date   Anemia    as a child and during pregnancy   Anxiety    CIN I (cervical intraepithelial neoplasia I)    1999-LEEP, normal Paps after    Depression    Essential hypertension 05/08/2019   Patient follows with Dr. Lavern Heck, PCP. ARNETTA 07/14/21 as of 02/28/22.   Family history of premature CAD 11/20/2018   Mom   GERD (gastroesophageal reflux disease)    H/O gestational diabetes mellitus, not currently pregnant    GESTATIONAL     Headache    hx of migraines, migraine w/parathesia on 04/04/2019, MRI normal    History of melanoma 07/14/2021   Face, 20s. Sees Dr. Ivin annually   Prolapse of anterior vaginal wall 2023   SUI (stress urinary incontinence, female) 2023   Tinnitus aurium, bilateral    since 2018, off and on   Wears glasses    reading only     Social History   Tobacco Use   Smoking status: Never   Smokeless tobacco: Never  Vaping Use   Vaping status: Never Used  Substance Use Topics   Alcohol use: Not Currently    Comment: no   Drug use: No    Past Surgical History:  Procedure Laterality Date   AUGMENTATION MAMMAPLASTY  2005   BLADDER SUSPENSION N/A 03/06/2022   Procedure: TRANSVAGINAL TAPE (TVT) PROCEDURE;  Surgeon: Marilynne Rosaline SAILOR, MD;  Location: [facility];  Service: Gynecology;  Laterality: N/A;   BUNIONECTOMY Left 1997   CERVICAL BIOPSY  W/ LOOP ELECTRODE EXCISION  1999   CIN I   COLONOSCOPY  11/26/2020   normal   CYSTOCELE REPAIR N/A 03/06/2022   Procedure: ANTERIOR REPAIR (CYSTOCELE);  Surgeon: Marilynne Rosaline SAILOR, MD;  Location: [facility];  Service: Gynecology;  Laterality: N/A;   CYSTOSCOPY N/A 03/06/2022   Procedure: CYSTOSCOPY;  Surgeon: Marilynne Rosaline SAILOR, MD;  Location: [facility];  Service: Gynecology;  Laterality: N/A;   ear drum surgery     at age 60  Family History  Problem Relation Age of Onset   Diabetes Mother    Heart disease Mother    Hypertension Mother    Hyperlipidemia Mother    Lung cancer Mother    Cancer Father        RENAL CELL   Renal cancer Father    Heart disease Maternal Grandmother    Heart disease Paternal Grandmother    Hypertension Paternal Grandmother    Heart disease Paternal Grandfather    Prostate cancer Paternal Grandfather    Healthy Daughter    Epilepsy Son    Learning disabilities Maternal Aunt    Diabetes Brother    Cerebral aneurysm Maternal Grandfather    Colon cancer Neg Hx    Esophageal cancer Neg Hx    Stomach cancer Neg Hx    Rectal cancer  Neg Hx     No Known Allergies  Current Medications:   Current Outpatient Medications:    acetaminophen  (TYLENOL ) 500 MG tablet, Take 1 tablet (500 mg total) by mouth every 6 (six) hours as needed (pain)., Disp: 30 tablet, Rfl: 0   famotidine (PEPCID) 10 MG tablet, Take 10 mg by mouth as needed for heartburn or indigestion. Pepcid AC chewable, Disp: , Rfl:    fluticasone (FLONASE) 50 MCG/ACT nasal spray, SMARTSIG:1 Spray(s) Both Nares Every 12 Hours, Disp: , Rfl:    hydrochlorothiazide  (MICROZIDE ) 12.5 MG capsule, TAKE 1 CAPSULE BY MOUTH EVERY DAY, Disp: 30 capsule, Rfl: 11   ibuprofen  (ADVIL ) 600 MG tablet, Take 1 tablet (600 mg total) by mouth every 6 (six) hours as needed., Disp: 30 tablet, Rfl: 0   metoprolol  succinate (TOPROL -XL) 100 MG 24 hr tablet, TAKE 1 TABLET BY MOUTH EVERY DAY, Disp: 30 tablet, Rfl: 11   Multiple Vitamins-Minerals (AIRBORNE GUMMIES PO), Take by mouth. 3 gummies daily, Disp: , Rfl:    Review of Systems:   Negative unless otherwise specified per HPI.  Vitals:   Vitals:   05/08/24 1049  BP: 130/80  Pulse: (!) 105  Temp: 97.7 F (36.5 C)  TempSrc: Temporal  SpO2: 99%  Weight: 150 lb 6.1 oz (68.2 kg)  Height: 5' 2.5 (1.588 m)     Body mass index is 27.07 kg/m.  Physical Exam:   Physical Exam Vitals and nursing note reviewed.  Constitutional:      General: She is not in acute distress.    Appearance: She is well-developed. She is not ill-appearing or toxic-appearing.  HENT:     Head: Normocephalic and atraumatic.     Right Ear: Ear canal and external ear normal. Tympanic membrane is not erythematous, retracted or bulging.     Left Ear: Ear canal and external ear normal. Tympanic membrane is scarred. Tympanic membrane is not erythematous, retracted or bulging.     Nose: Nose normal.     Right Sinus: No maxillary sinus tenderness or frontal sinus tenderness.     Left Sinus: No maxillary sinus tenderness or frontal sinus tenderness.      Mouth/Throat:     Pharynx: Uvula midline. No posterior oropharyngeal erythema.     Tonsils: 1+ on the right. 1+ on the left.  Eyes:     General: Lids are normal.     Conjunctiva/sclera: Conjunctivae normal.  Neck:     Trachea: Trachea normal.  Cardiovascular:     Rate and Rhythm: Normal rate and regular rhythm.     Heart sounds: Normal heart sounds, S1 normal and S2 normal.  Pulmonary:     Effort:  Pulmonary effort is normal.     Breath sounds: Normal breath sounds. No decreased breath sounds, wheezing, rhonchi or rales.  Abdominal:     Tenderness: There is no right CVA tenderness or left CVA tenderness.  Lymphadenopathy:     Cervical: No cervical adenopathy.  Skin:    General: Skin is warm and dry.  Neurological:     Mental Status: She is alert.  Psychiatric:        Speech: Speech normal.        Behavior: Behavior normal. Behavior is cooperative.    Results for orders placed or performed in visit on 05/08/24  POC COVID-19 BinaxNow  Result Value Ref Range   SARS Coronavirus 2 Ag Negative Negative  POCT rapid strep A  Result Value Ref Range   Rapid Strep A Screen Negative Negative     Assessment and Plan:   Assessment and Plan Assessment & Plan Fever  Right-sided throat pain with negative strep and COVID test in office today. -concern for possible tonsillitis -- will empirically treat with Augmentin  -will add astelin  for eustachian tube dysfunction  -check urinalysis and culture to ensure no urinary tract infection (UTI)   If new/worsening symptom(s), recommend close follow up for advice on next steps        Lucie Buttner, PA-C

## 2024-05-09 LAB — URINE CULTURE
MICRO NUMBER:: 16864804
SPECIMEN QUALITY:: ADEQUATE

## 2024-05-11 ENCOUNTER — Encounter: Payer: Self-pay | Admitting: Physician Assistant

## 2024-05-12 ENCOUNTER — Encounter: Payer: Self-pay | Admitting: Family

## 2024-05-12 ENCOUNTER — Telehealth: Payer: Self-pay

## 2024-05-12 ENCOUNTER — Ambulatory Visit: Admitting: Family

## 2024-05-12 ENCOUNTER — Ambulatory Visit: Payer: Self-pay | Admitting: Physician Assistant

## 2024-05-12 VITALS — BP 104/73 | HR 108 | Ht 62.5 in

## 2024-05-12 DIAGNOSIS — R112 Nausea with vomiting, unspecified: Secondary | ICD-10-CM | POA: Diagnosis not present

## 2024-05-12 DIAGNOSIS — R509 Fever, unspecified: Secondary | ICD-10-CM

## 2024-05-12 DIAGNOSIS — E010 Iodine-deficiency related diffuse (endemic) goiter: Secondary | ICD-10-CM | POA: Diagnosis not present

## 2024-05-12 LAB — POCT URINALYSIS DIPSTICK
Bilirubin, UA: NEGATIVE
Blood, UA: POSITIVE — AB
Glucose, UA: NEGATIVE
Ketones, UA: POSITIVE — AB
Leukocytes, UA: NEGATIVE
Nitrite, UA: NEGATIVE
Protein, UA: NEGATIVE
Spec Grav, UA: 1.02 (ref 1.010–1.025)
Urobilinogen, UA: 0.2 U/dL
pH, UA: 6.5 (ref 5.0–8.0)

## 2024-05-12 MED ORDER — ONDANSETRON HCL 4 MG PO TABS: 4.0000 mg | ORAL_TABLET | Freq: Three times a day (TID) | ORAL | 0 refills | Status: DC | PRN

## 2024-05-12 NOTE — Telephone Encounter (Signed)
 Please see message and advise. Pt saw you on 8/21.

## 2024-05-12 NOTE — Telephone Encounter (Signed)
-----   Message from Lucius Krabbe sent at 05/12/2024  3:06 PM EDT ----- Regarding: call patient Call [name] and ask her to call the radiology office - [phone] to schedule her thyroid  ultrasound. I can't order it urgent, but they said if she goes ahead to call it can save time. I ordered it to be done at the [facility] location to be closer to home, but she should go wherever they can get her in the quickest, as long as not an inconvenient drive. Thx

## 2024-05-12 NOTE — Progress Notes (Signed)
 Patient ID: [name], female    DOB: 12-Sep-1971, 53 y.o.   MRN: [id number]  Chief Complaint  Patient presents with   Fever    Pt c/o fever, chills, body aches and sore throat. SX present since 8/17. Has tried motrin , amoxicillin , Flonase and tylenol . Negative for covid and strep. Blood in urine on 8/21.   Discussed the use of AI scribe software for clinical note transcription with the patient, who gave verbal consent to proceed.  History of Present Illness   DIMOND CROTTY [facility] is a 53 year old female who presents with persistent fever and fatigue.  Fever and constitutional symptoms - Persistent fever since August 17th, temporarily resolving with Tylenol  or Motrin  but recurring - Significant fatigue impacting ability to get out of bed - Lack of energy and appetite - Motrin  taken daily for symptom management  Gastrointestinal symptoms - Nausea present, with today being the first day with sensation of possible vomiting - No abdominal pain - No constipation - No significant changes in bowel movements, though stools have been loose due to antibiotic - No significant pain except for a sensation on her left side near hip, previously attributed to arthritis or sciatica  Oropharyngeal and neck symptoms - Persistent sore throat with swallowing - History of strep throat - Swelling and tenderness in the neck  Ear symptoms - Sensation of fluid in ears - Previous examinations showed no significant issues  Urinary symptoms - No dysuria, pelvic pain, or foul odor - Frequent urination since bladder surgery - Recent tests show microscopic hematuria  Respiratory symptoms - No significant cough or respiratory symptoms  Medication use - Completed a course of Augmentin , started after initial visit on August 21st, and continues to take it - Uses a nasal spray - Takes hydrochlorothiazide  and metoprolol  for HTN  Endocrine symptoms - No thyroid  issues     Assessment & Plan:      Persistent fever, fatigue Fever resolves with acetaminophen  or ibuprofen  but recurs. Fatigue may be related to thyroid  issues & fever. Differential includes viral/bacterial infection and thyroiditis. Concerns about dehydration due to difficulty keeping fluids down. - Continue Augmentin . - Prescribe ondansetron  4mg  q8h prn for nausea. - Advised to control fever with acetaminophen  or ibuprofen . - Order blood count to check for infection and anemia. - Order thyroid  scan to evaluate for thyroiditis. - Advised to maintain hydration with small sips of fluids. - Instruct to go to the emergency room if unable to keep fluids down or if symptoms worsen.  Nausea Sx started today, no vomiting. Unsure if d/t daily Motrin  use or antibiotic taken w/o food. - Sending Zofran  4mg  q8h prn, advised on use & SE. - Focus on hydration over food, eat small mini meals until appetite is back.   Sore throat Persistent sore throat with lymphadenopathy. Previous strep test was negative. Pharynx with no erythema or swollen tonsils.  - Continue warm salt water gargles, Ibuprofen /Motrin  prn.  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 Suspected thyroiditis contributing to fatigue, fever, and sore throat. - Order thyroid  scan. - Check thyroid  function tests.  Microscopic hematuria Microscopic hematuria on urinalysis with trace blood. No urinary symptoms. History of bladder surgery. Persistent findings warrant urologist follow-up. - Advise to contact urologist for evaluation of hematuria. - Inform urologist of two recent positive tests for blood in urine.    Subjective:    Outpatient Medications Prior to Visit  Medication Sig Dispense Refill   acetaminophen  (  TYLENOL ) 500 MG tablet Take 1 tablet (500 mg total) by mouth every 6 (six) hours as needed (pain). 30 tablet 0   amoxicillin -clavulanate (AUGMENTIN ) 875-125 MG tablet Take 1 tablet by mouth 2 (two) times  daily. 14 tablet 0   azelastine  (ASTELIN ) 0.1 % nasal spray Place 1 spray into both nostrils 2 (two) times daily. Use in each nostril as directed 30 mL 12   famotidine (PEPCID) 10 MG tablet Take 10 mg by mouth as needed for heartburn or indigestion. Pepcid AC chewable     hydrochlorothiazide  (MICROZIDE ) 12.5 MG capsule TAKE 1 CAPSULE BY MOUTH EVERY DAY 30 capsule 11   ibuprofen  (ADVIL ) 600 MG tablet Take 1 tablet (600 mg total) by mouth every 6 (six) hours as needed. 30 tablet 0   metoprolol  succinate (TOPROL -XL) 100 MG 24 hr tablet TAKE 1 TABLET BY MOUTH EVERY DAY 30 tablet 11   Multiple Vitamins-Minerals (AIRBORNE GUMMIES PO) Take by mouth. 3 gummies daily     fluticasone (FLONASE) 50 MCG/ACT nasal spray SMARTSIG:1 Spray(s) Both Nares Every 12 Hours     No facility-administered medications prior to visit.   Past Medical History:  Diagnosis Date   Anemia    as a child and during pregnancy   Anxiety    CIN I (cervical intraepithelial neoplasia I)    1999-LEEP, normal Paps after    Depression    Essential hypertension 05/08/2019   Patient follows with Dr. Lavern Heck, PCP. ARNETTA 07/14/21 as of 02/28/22.   Family history of premature CAD 11/20/2018   Mom   GERD (gastroesophageal reflux disease)    H/O gestational diabetes mellitus, not currently pregnant    GESTATIONAL     Headache    hx of migraines, migraine w/parathesia on 04/04/2019, MRI normal   History of melanoma 07/14/2021   Face, 20s. Sees Dr. Ivin annually   Prolapse of anterior vaginal wall 2023   SUI (stress urinary incontinence, female) 2023   Tinnitus aurium, bilateral    since 2018, off and on   Wears glasses    reading only   Past Surgical History:  Procedure Laterality Date   AUGMENTATION MAMMAPLASTY  2005   BLADDER SUSPENSION N/A 03/06/2022   Procedure: TRANSVAGINAL TAPE (TVT) PROCEDURE;  Surgeon: Marilynne Rosaline SAILOR, MD;  Location: [facility];  Service: Gynecology;  Laterality: N/A;    BUNIONECTOMY Left 1997   CERVICAL BIOPSY  W/ LOOP ELECTRODE EXCISION  1999   CIN I   COLONOSCOPY  11/26/2020   normal   CYSTOCELE REPAIR N/A 03/06/2022   Procedure: ANTERIOR REPAIR (CYSTOCELE);  Surgeon: Marilynne Rosaline SAILOR, MD;  Location: [facility];  Service: Gynecology;  Laterality: N/A;   CYSTOSCOPY N/A 03/06/2022   Procedure: CYSTOSCOPY;  Surgeon: Marilynne Rosaline SAILOR, MD;  Location: [facility];  Service: Gynecology;  Laterality: N/A;   ear drum surgery     at age 68   No Known Allergies    Objective:    Physical Exam Vitals and nursing note reviewed.  Constitutional:      Appearance: Normal appearance.  HENT:     Right Ear: Tympanic membrane and ear canal normal.     Left Ear: Tympanic membrane is scarred.     Mouth/Throat:     Mouth: Mucous membranes are moist.     Pharynx: Postnasal drip present. No pharyngeal swelling, oropharyngeal exudate, posterior oropharyngeal erythema or uvula swelling.     Tonsils: No tonsillar exudate or tonsillar abscesses.  Neck:  Thyroid : Thyromegaly (right side) present.  Cardiovascular:     Rate and Rhythm: Normal rate and regular rhythm.  Pulmonary:     Effort: Pulmonary effort is normal.     Breath sounds: Normal breath sounds.  Musculoskeletal:        General: Normal range of motion.  Lymphadenopathy:     Head:     Right side of head: No submental, submandibular, tonsillar, preauricular, posterior auricular or occipital adenopathy.     Left side of head: No submental, submandibular, tonsillar, preauricular, posterior auricular or occipital adenopathy.     Cervical: Cervical adenopathy present.     Right cervical: No superficial cervical adenopathy.    Left cervical: Superficial cervical adenopathy present.  Skin:    General: Skin is warm and dry.  Neurological:     Mental Status: She is alert.  Psychiatric:        Mood and Affect: Mood normal.        Behavior: Behavior normal.    BP 104/73  (BP Location: Left Arm, Patient Position: Sitting, Cuff Size: Normal)   Pulse (!) 108   Ht 5' 2.5 (1.588 m)   SpO2 98%   BMI 27.07 kg/m  Wt Readings from Last 3 Encounters:  05/08/24 150 lb 6.1 oz (68.2 kg)  10/15/23 148 lb 12.8 oz (67.5 kg)  09/03/23 152 lb 9.6 oz (69.2 kg)      Lucius Krabbe, NP

## 2024-05-12 NOTE — Telephone Encounter (Addendum)
 I called pt, pt states Radiology has already called her. Pt is scheduled for 8/26 at 2:00 PM.

## 2024-05-13 ENCOUNTER — Ambulatory Visit
Admission: RE | Admit: 2024-05-13 | Discharge: 2024-05-13 | Disposition: A | Discharge status: Home / Self Care | Payer: Self-pay | Source: Ambulatory Visit | Attending: Family | Admitting: Family

## 2024-05-13 DIAGNOSIS — E010 Iodine-deficiency related diffuse (endemic) goiter: Secondary | ICD-10-CM

## 2024-05-13 LAB — CBC WITH DIFFERENTIAL/PLATELET
Basophils Absolute: 0 K/uL (ref 0.0–0.1)
Basophils Relative: 0.3 % (ref 0.0–3.0)
Eosinophils Absolute: 0 K/uL (ref 0.0–0.7)
Eosinophils Relative: 0.2 % (ref 0.0–5.0)
HCT: 38.6 % (ref 36.0–46.0)
Hemoglobin: 12.8 g/dL (ref 12.0–15.0)
Lymphocytes Relative: 8.6 % — ABNORMAL LOW (ref 12.0–46.0)
Lymphs Abs: 1.1 K/uL (ref 0.7–4.0)
MCHC: 33.2 g/dL (ref 30.0–36.0)
MCV: 89.2 fl (ref 78.0–100.0)
Monocytes Absolute: 1 K/uL (ref 0.1–1.0)
Monocytes Relative: 8.1 % (ref 3.0–12.0)
Neutro Abs: 10.1 K/uL — ABNORMAL HIGH (ref 1.4–7.7)
Neutrophils Relative %: 82.8 % — ABNORMAL HIGH (ref 43.0–77.0)
Platelets: 373 K/uL (ref 150.0–400.0)
RBC: 4.33 Mil/uL (ref 3.87–5.11)
RDW: 13.6 % (ref 11.5–15.5)
WBC: 12.2 K/uL — ABNORMAL HIGH (ref 4.0–10.5)

## 2024-05-13 LAB — T4, FREE: Free T4: 3.48 ng/dL — ABNORMAL HIGH (ref 0.60–1.60)

## 2024-05-13 LAB — TSH: TSH: 0.06 u[IU]/mL — ABNORMAL LOW (ref 0.35–5.50)

## 2024-05-14 ENCOUNTER — Ambulatory Visit: Payer: Self-pay | Admitting: Family

## 2024-05-14 DIAGNOSIS — E060 Acute thyroiditis: Secondary | ICD-10-CM

## 2024-05-14 LAB — THYROID PEROXIDASE ANTIBODY: Thyroperoxidase Ab SerPl-aCnc: 4 [IU]/mL (ref <9)

## 2024-05-14 LAB — THYROGLOBULIN ANTIBODY: Thyroglobulin Ab: 1 [IU]/mL (ref <=1)

## 2024-05-14 NOTE — Progress Notes (Signed)
 Result message sent thru thyroid  scan results. Labs indicating thyroiditis, sending urgent referral to ENDO and will manage symptoms best as possible until she can be seen.

## 2024-05-15 ENCOUNTER — Ambulatory Visit (INDEPENDENT_AMBULATORY_CARE_PROVIDER_SITE_OTHER): Admitting: Endocrinology

## 2024-05-15 ENCOUNTER — Encounter: Payer: Self-pay | Admitting: Endocrinology

## 2024-05-15 VITALS — BP 142/70 | HR 91 | Resp 20 | Ht 62.5 in | Wt 147.4 lb

## 2024-05-15 DIAGNOSIS — E0590 Thyrotoxicosis, unspecified without thyrotoxic crisis or storm: Secondary | ICD-10-CM | POA: Diagnosis not present

## 2024-05-15 DIAGNOSIS — E069 Thyroiditis, unspecified: Secondary | ICD-10-CM | POA: Diagnosis not present

## 2024-05-15 MED ORDER — PREDNISONE 20 MG PO TABS: 20.0000 mg | ORAL_TABLET | Freq: Every day | ORAL | 1 refills | Status: DC

## 2024-05-15 MED ORDER — NAPROXEN 500 MG PO TABS: 500.0000 mg | ORAL_TABLET | Freq: Two times a day (BID) | ORAL | 0 refills | Status: DC

## 2024-05-15 NOTE — Progress Notes (Signed)
 Outpatient Endocrinology Note [place] Osmani Kersten, MD   Patient's Name: [name]    DOB: 1971/03/22    MRN: [id number]  REASON OF VISIT: New consult for hyperthyroidism  REFERRING PROVIDER: Lucius Krabbe, NP  PCP: Jodie Lavern CROME, MD  HISTORY OF PRESENT ILLNESS:   [name] is a 53 y.o. old female with past medical history as listed below is presented for new consult for hyperthyroidism.   Pertinent Thyroid  History: Patient is referred to endocrinology for evaluation and management of hyperthyroidism.  Patient has symptoms of fatigue, mild low-grade fever associated with nasal congestion, earache, neck pain, sore throat from last Wednesday.  She was evaluated by primary care provider on August 25.  She had thyroid  function test on August 25 showed low TSH of 0.06 and elevated free T4 of 3.48 consistent with hypothyroidism along with normal thyroid  peroxidase and thyroglobulin antibody.  Patient has been taking Tylenol  or Motrin  or Advil  as needed for fever and pain.  She has not noticed significant improvement of the pain however mildly better compared to when it is started.  She has occasional palpitation, has noticed increased sweating during the night and also has heat intolerance.  Patient has been started on Augmentin  empirically.  She denies change in bowel habit with diarrhea or constipation.  She had normal thyroid  function test as of December 2024 with TSH of 1.90 and had normal thyroid  function test in the last few years.  She does not have history of thyroid  disorder.  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  No recent IV iodine contrast use.  No family history of thyroid  disorder.  No amiodarone use.  Labs :   Latest Reference Range & Units 05/12/24 14:45  TSH 0.35 - 5.50 uIU/mL 0.06 (L)  T4,Free(Direct)  0.60 - 1.60 ng/dL 6.51 (H)  Thyroglobulin Ab < or = 1 IU/mL 1  Thyroperoxidase Ab SerPl-aCnc <9 IU/mL 4  (L): Data is abnormally low (H): Data is abnormally high   Latest Reference Range & Units 02/26/19 10:55 07/14/21 10:35 09/03/23 14:30  TSH 0.35 - 5.50 uIU/mL 2.28 1.54 1.90   REVIEW OF SYSTEMS:  As per history of present illness.   PAST MEDICAL HISTORY: Past Medical History:  Diagnosis Date   Anemia    as a child and during pregnancy   Anxiety    CIN I (cervical intraepithelial neoplasia I)    1999-LEEP, normal Paps after    Depression    Essential hypertension 05/08/2019   Patient follows with Dr. Lavern Jodie, PCP. [facility] 07/14/21 as of 02/28/22.   Family history of premature CAD 11/20/2018   Mom   GERD (gastroesophageal reflux disease)    H/O gestational diabetes mellitus, not currently pregnant    GESTATIONAL     Headache    hx of migraines, migraine w/parathesia on 04/04/2019, MRI normal   History of melanoma 07/14/2021   Face, 20s. Sees Dr. Ivin annually   Prolapse of anterior vaginal wall 2023   SUI (stress urinary incontinence, female) 2023   Tinnitus aurium, bilateral    since 2018, off and on   Wears glasses    reading only    PAST SURGICAL HISTORY: Past Surgical History:  Procedure Laterality Date   AUGMENTATION MAMMAPLASTY  2005   BLADDER SUSPENSION N/A 03/06/2022   Procedure: TRANSVAGINAL TAPE (TVT) PROCEDURE;  Surgeon: Marilynne Rosaline SAILOR, MD;  Location: [facility]  [place];  Service: Gynecology;  Laterality: N/A;   BUNIONECTOMY Left 1997   CERVICAL BIOPSY  W/ LOOP ELECTRODE EXCISION  1999   CIN I   COLONOSCOPY  11/26/2020   normal   CYSTOCELE REPAIR N/A 03/06/2022   Procedure: ANTERIOR REPAIR (CYSTOCELE);  Surgeon: Marilynne Rosaline SAILOR, MD;  Location: [facility];  Service: Gynecology;  Laterality: N/A;   CYSTOSCOPY N/A 03/06/2022   Procedure: CYSTOSCOPY;  Surgeon: Marilynne Rosaline SAILOR, MD;  Location: [facility];  Service: Gynecology;  Laterality: N/A;   ear drum surgery     at age 33    ALLERGIES: No Known Allergies  FAMILY HISTORY:  Family History  Problem Relation Age of Onset   Diabetes Mother    Heart disease Mother    Hypertension Mother    Hyperlipidemia Mother    Lung cancer Mother    Cancer Father        RENAL CELL   Renal cancer Father    Heart disease Maternal Grandmother    Heart disease Paternal Grandmother    Hypertension Paternal Grandmother    Heart disease Paternal Grandfather    Prostate cancer Paternal Grandfather    Healthy Daughter    Epilepsy Son    Learning disabilities Maternal Aunt    Diabetes Brother    Cerebral aneurysm Maternal Grandfather    Colon cancer Neg Hx    Esophageal cancer Neg Hx    Stomach cancer Neg Hx    Rectal cancer Neg Hx     SOCIAL HISTORY: Social History   Socioeconomic History   Marital status: Divorced    Spouse name: has a boyfriend   Number of children: 2   Years of education: Not on file   Highest education level: Not on file  Occupational History   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   Has 2 children and boyfriend has 3 children   Social Drivers of Corporate investment banker Strain: Not on file  Food Insecurity: Not on file  Transportation Needs: Not on file  Physical Activity: Not on file  Stress: Not on file  Social Connections: Not on file    MEDICATIONS:  Current Outpatient Medications  Medication Sig Dispense Refill   acetaminophen  (TYLENOL ) 500 MG tablet Take 1 tablet (500 mg total) by mouth every 6 (six) hours as needed (pain). 30  tablet 0   amoxicillin -clavulanate (AUGMENTIN ) 875-125 MG tablet Take 1 tablet by mouth 2 (two) times daily. 14 tablet 0   azelastine  (ASTELIN ) 0.1 % nasal spray Place 1 spray into both nostrils 2 (two) times daily. Use in each nostril as directed 30 mL 12   famotidine (PEPCID) 10 MG tablet Take 10 mg by mouth as needed for heartburn or indigestion. Pepcid AC chewable     fluticasone (FLONASE) 50 MCG/ACT nasal spray SMARTSIG:1 Spray(s) Both Nares Every 12 Hours     hydrochlorothiazide  (MICROZIDE ) 12.5 MG capsule TAKE 1 CAPSULE BY MOUTH EVERY DAY 30 capsule 11   ibuprofen  (ADVIL ) 600 MG tablet  Take 1 tablet (600 mg total) by mouth every 6 (six) hours as needed. 30 tablet 0   metoprolol  succinate (TOPROL -XL) 100 MG 24 hr tablet TAKE 1 TABLET BY MOUTH EVERY DAY 30 tablet 11   Multiple Vitamins-Minerals (AIRBORNE GUMMIES PO) Take by mouth. 3 gummies daily     naproxen  (NAPROSYN ) 500 MG tablet Take 1 tablet (500 mg total) by mouth 2 (two) times daily with a meal. For pain & fever. 60 tablet 0   ondansetron  (ZOFRAN ) 4 MG tablet Take 1 tablet (4 mg total) by mouth every 8 (eight) hours as needed for nausea or vomiting. 20 tablet 0   predniSONE  (DELTASONE ) 20 MG tablet Take 1 tablet (20 mg total) by mouth daily with breakfast. 5 tablet 1   No current facility-administered medications for this visit.    PHYSICAL EXAM: Vitals:   05/15/24 1551  BP: (!) 142/70  Pulse: 91  Resp: 20  SpO2: 98%  Weight: 147 lb 6.4 oz (66.9 kg)  Height: 5' 2.5 (1.588 m)   Body mass index is 26.53 kg/m.  Wt Readings from Last 3 Encounters:  05/15/24 147 lb 6.4 oz (66.9 kg)  05/08/24 150 lb 6.1 oz (68.2 kg)  10/15/23 148 lb 12.8 oz (67.5 kg)    General: Well developed, well nourished female in no apparent distress.  HEENT: AT/[place], no external lesions. Hearing intact to the spoken word Eyes: EOMI. No stare, proptosis or lid lag. Conjunctiva clear and no icterus. No erythema or watering Neck: Trachea midline, neck  supple with mild appreciable thyromegaly or no lymphadenopathy and no palpable thyroid  nodules. Tenderness + Lungs: Clear to auscultation, no wheeze. Respirations not labored Heart: S1S2, Regular in rate and rhythm.  Abdomen: Soft, non tender Neurologic: Alert, oriented, normal speech, deep tendon biceps reflexes 3+,  no gross focal neurological deficit Extremities: No pedal pitting edema, no tremors of outstretched hands Skin: Warm, color good.  Psychiatric: Does not appear depressed or anxious  PERTINENT HISTORIC LABORATORY AND IMAGING STUDIES:  All pertinent laboratory results were reviewed. Please see HPI also for further details.   TSH  Date Value Ref Range Status  05/12/2024 0.06 (L) 0.35 - 5.50 uIU/mL Final  09/03/2023 1.90 0.35 - 5.50 uIU/mL Final  07/14/2021 1.54 0.35 - 5.50 uIU/mL Final    Lab Results  Component Value Date   FREET4 3.48 (H) 05/12/2024   TSH 0.06 (L) 05/12/2024   TSH 1.90 09/03/2023   TSH 1.54 07/14/2021    No results found for: THYROTRECAB  Lab Results  Component Value Date   TSH 0.06 (L) 05/12/2024   TSH 1.90 09/03/2023   TSH 1.54 07/14/2021   FREET4 3.48 (H) 05/12/2024     No results found for: TSI   No components found for: TRAB    ASSESSMENT / PLAN  1. Thyroiditis   2. Hyperthyroidism    Patient has hyperthyroidism.  She is also having symptoms of cold with low-grade fever, nasal congestion and sore throat.  This clinical context hyperthyroidism is most likely due to subacute thyroiditis.  This is a inflammatory condition of the thyroid  gland may have been triggered by viral infection.  Ultrasound thyroid  recently with normal solid thyroid  nodule, consistent with heterogenous thyroid  parenchyma/chronic inflammatory thyroid  disorder/thyroiditis.  If it is thyroiditis related hyperthyroidism she does not need antithyroid medication.  Treatment is mainly symptomatic.  Thyroiditis will improve over time within 6 to 8 weeks with  normalization of thyroid  function test or some time it can progress to hypothyroidism.  I have provided ATA handout about thyroiditis.  Plan: - I would like to check free T3 and also like to check autoantibodies for Graves' disease TRAb and TSI. - She did not have optimal relief with NSAIDs and Tylenol  for neck pain.  I would like to start prednisone  20 mg daily for 5 days.  Patient is asked to take another 5 days if needed and then stop. - Patient is currently on beta-blocker metoprolol  and heart rate is 91. - She needs close monitoring of thyroid  function test. - Will follow-up in 6 weeks and check thyroid  function test at that time.   Diagnoses and all orders for this visit:  Thyroiditis -     T3, free -     Thyroid  stimulating immunoglobulin -     TRAb (TSH Receptor Binding Antibody) -     predniSONE  (DELTASONE ) 20 MG tablet; Take 1 tablet (20 mg total) by mouth daily with breakfast.  Hyperthyroidism -     T3, free -     Thyroid  stimulating immunoglobulin -     TRAb (TSH Receptor Binding Antibody)    DISPOSITION Follow up in clinic in 6 weeks suggested.  All questions answered and patient verbalized understanding of the plan.  [place] Darya Bigler, MD [facility] Endocrinology [facility] [address] [place], [address] [place], [place] [address] Phone # [phone]   At least part of this note was generated using voice recognition software. Inadvertent word errors may have occurred, which were not recognized during the proofreading process.

## 2024-05-16 ENCOUNTER — Ambulatory Visit: Payer: Self-pay | Admitting: Endocrinology

## 2024-05-16 DIAGNOSIS — E0590 Thyrotoxicosis, unspecified without thyrotoxic crisis or storm: Secondary | ICD-10-CM

## 2024-05-20 LAB — TRAB (TSH RECEPTOR BINDING ANTIBODY): TRAB: <1 IU/L (ref <=2.00)

## 2024-05-20 LAB — THYROID STIMULATING IMMUNOGLOBULIN: TSI: <89 %{baseline} (ref <140)

## 2024-05-20 LAB — T3, FREE: T3, Free: 13.4 pg/mL — ABNORMAL HIGH (ref 2.3–4.2)

## 2024-06-09 ENCOUNTER — Ambulatory Visit: Payer: Self-pay

## 2024-06-09 NOTE — Telephone Encounter (Signed)
 FYI Only or Action Required?: FYI only for provider.  Patient was last seen in primary care on 05/12/2024 by Lucius Krabbe, NP.  Called Nurse Triage reporting Sore Throat. Swollen tonsil ear drainage, white limp on tonsil  Symptoms began about a month ago.  Interventions attempted: Nothing.  Symptoms are: unchanged.  Triage Disposition: be seen in office.  Patient/caregiver understands and will follow disposition?: Yes           Copied from CRM [id number]. Topic: Clinical - Red Word Triage >> Jun 09, 2024 11:19 AM Mesmerise C wrote: [facility] that prompted transfer to Nurse Triage: Patient stated she was sick a month and half ago and her right tonsil is still swollen and won't go down, feels it on  her ear describes it as feeling like something's stuck Reason for Disposition  [1] Sore throat is the only symptom AND [2] sore throat present < 48 hours  Answer Assessment - Initial Assessment Questions 1. ONSET: When did the throat start hurting? (Hours or days ago)      6 weeks 2. SEVERITY: How bad is the sore throat? (Scale 1-10; mild, moderate or severe)     Feels like something caught in her throat. 3. STREP EXPOSURE: Has there been any exposure to strep within the past week? If Yes, ask: What type of contact occurred?      no 4.  VIRAL SYMPTOMS: Are there any symptoms of a cold, such as a runny nose, cough, hoarse voice or red eyes?      Ear drainage 5. FEVER: Do you have a fever? If Yes, ask: What is your temperature, how was it measured, and when did it start?     no 6. PUS ON THE TONSILS: Is there pus on the tonsils in the back of your throat?     White spots 7. OTHER SYMPTOMS: Do you have any other symptoms? (e.g., difficulty breathing, headache, rash)     No - something stuck in her throat, Sinus drainage  Protocols used: Sore Throat-A-AH

## 2024-06-10 ENCOUNTER — Other Ambulatory Visit: Payer: Self-pay | Admitting: Family Medicine

## 2024-06-10 ENCOUNTER — Ambulatory Visit: Admitting: Family Medicine

## 2024-06-10 VITALS — BP 137/81 | HR 69 | Temp 98.1°F | Ht 62.5 in | Wt 149.4 lb

## 2024-06-10 DIAGNOSIS — J029 Acute pharyngitis, unspecified: Secondary | ICD-10-CM | POA: Diagnosis not present

## 2024-06-10 LAB — POCT RAPID STREP A (OFFICE): Rapid Strep A Screen: POSITIVE — AB

## 2024-06-10 MED ORDER — AMOXICILLIN 500 MG PO CAPS: 500.0000 mg | ORAL_CAPSULE | Freq: Three times a day (TID) | ORAL | 0 refills | Status: AC

## 2024-06-10 NOTE — Patient Instructions (Signed)
 It was very nice to see you today!  VISIT SUMMARY: You came in today with a persistent sore throat and right ear pain. You were diagnosed with strep throat and prescribed antibiotics. We also discussed your thyroid  condition and the need for follow-up tests.  YOUR PLAN: ACUTE STREPTOCOCCAL PHARYNGITIS: You have a strep throat infection causing a sore throat, swelling, and ear pain. -Take amoxicillin  500 mg, one pill three times a day for 10 days. -Avoid close contact and sharing utensils until you have been on antibiotics for 24 hours.  RIGHT OTALGIA WITH MIDDLE EAR EFFUSION: You have right ear pain with a sensation of fluid, likely related to your throat infection. -Monitor your symptoms and let us  know if there is no improvement after finishing the antibiotics.  THYROIDITIS: You have an overactive thyroid  causing exhaustion, palpitations, and hot flashes, likely due to a viral infection. -Attend your scheduled thyroid  lab work next Friday. -Monitor your symptoms and follow up with your endocrinologist as needed.  Return if symptoms worsen or fail to improve.   Take care, Dr Kennyth  PLEASE NOTE:  If you had any lab tests, please let us  know if you have not heard back within a few days. You may see your results on mychart before we have a chance to review them but we will give you a call once they are reviewed by us .   If we ordered any referrals today, please let us  know if you have not heard from their office within the next week.   If you had any urgent prescriptions sent in today, please check with the pharmacy within an hour of our visit to make sure the prescription was transmitted appropriately.   Please try these tips to maintain a healthy lifestyle:  Eat at least 3 REAL meals and 1-2 snacks per day.  Aim for no more than 5 hours between eating.  If you eat breakfast, please do so within one hour of getting up.   Each meal should contain half fruits/vegetables, one quarter  protein, and one quarter carbs (no bigger than a computer mouse)  Cut down on sweet beverages. This includes juice, soda, and sweet tea.   Drink at least 1 glass of water with each meal and aim for at least 8 glasses per day  Exercise at least 150 minutes every week.

## 2024-06-10 NOTE — Telephone Encounter (Signed)
 Appt today

## 2024-06-10 NOTE — Progress Notes (Signed)
   [name] is a 53 y.o. female who presents today for an office visit.  Assessment/Plan:  Strep pharyngitis Rapid strep positive.  Will start amoxicillin  500 mg 3 times daily for 10 days.  Encouraged.  She can use over-the-counter meds as needed as well.  She will let us  know if not improving  Thyroiditis She has completed prednisone  taper per endocrinology.  She will be getting a repeat set of labs done next week.  Unclear if this is related to her above strep pharyngitis as she did have a strep test performed about a month ago which was negative.  COVID test at that time was negative as well.  Her systemic symptoms have resolved with completing the prednisone  taper as above.  Essential hypertension Blood pressure at goal today on HCTZ 12.5 mg daily.     Subjective:  HPI:  See assessment / plan for status of chronic conditions.   Discussed the use of AI scribe software for clinical note transcription with the patient, who gave verbal consent to proceed.  History of Present Illness [name] [name] is a 53 year old female who presents with persistent sore throat and right ear pain.  She has a persistent sore throat on the right side, which feels swollen and seems to be pushing on her ear, causing right ear pain. This has been ongoing since August. Initial tests for flu and COVID were negative. She was treated with amoxicillin , but the symptoms persisted.  In August, she was diagnosed with thyroid  dysfunction after a series of tests, including a thyroid  ultrasound which showed no nodules, and blood work indicating an overactive thyroid . An endocrinologist ruled out Graves' disease and attributed her symptoms to a viral infection affecting her thyroid . She was prescribed prednisone  in two rounds of five days each to manage her symptoms.  She experienced a fever lasting about ten days, which has since resolved. She feels extremely exhausted, has heart palpitations, and  experiences hot flashes during the course of her illness. She is scheduled for a follow-up thyroid  function test next Friday to monitor her thyroid  levels.  She has a history of ear surgery at age 13 for a hole in her eardrum, which may be relevant to her current ear symptoms. She notes a sensation of drainage in her right ear.  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 She has noticed a spot on her throat and occasional swelling of the glands on the right side, though not as severe as initially.  No current fever or chills. Past fever, exhaustion, heart palpitations, and hot flashes.         Objective:  Physical Exam: BP 137/81   Pulse 69   Temp 98.1 F (36.7 C) (Temporal)   Ht 5' 2.5 (1.588 m)   Wt 149 lb 6.4 oz (67.8 kg)   SpO2 99%   BMI 26.89 kg/m   Gen: No acute distress, resting comfortably HEENT: Left TM with otosclerosis.  Right TM with clear effusion.  OP with mild erythema.  Mild right tonsillar hypertrophy noted.  No appreciable lymphadenopathy CV: Regular rate and rhythm with no murmurs appreciated Pulm: Normal work of breathing, clear to auscultation bilaterally with no crackles, wheezes, or rhonchi Neuro: Grossly normal, moves all extremities Psych: Normal affect and thought content      Joice Nazario M. Kennyth, MD 06/10/2024 2:16 PM

## 2024-06-20 ENCOUNTER — Other Ambulatory Visit

## 2024-06-20 LAB — TSH: TSH: 9.29 m[IU]/L — ABNORMAL HIGH

## 2024-06-20 LAB — T4, FREE: Free T4: 0.9 ng/dL (ref 0.8–1.8)

## 2024-06-20 LAB — T3, FREE: T3, Free: 3.2 pg/mL (ref 2.3–4.2)

## 2024-06-26 ENCOUNTER — Ambulatory Visit: Admitting: Endocrinology

## 2024-06-26 ENCOUNTER — Encounter: Payer: Self-pay | Admitting: Endocrinology

## 2024-06-26 VITALS — BP 136/80 | HR 71 | Resp 20 | Ht 62.5 in | Wt 152.0 lb

## 2024-06-26 DIAGNOSIS — E069 Thyroiditis, unspecified: Secondary | ICD-10-CM

## 2024-06-26 DIAGNOSIS — R7989 Other specified abnormal findings of blood chemistry: Secondary | ICD-10-CM | POA: Diagnosis not present

## 2024-06-26 NOTE — Progress Notes (Unsigned)
 Outpatient Endocrinology Note Veronica Starleen Trussell, MD   Patient's Name: [name]    DOB: 10/17/1970    MRN: [id number]  REASON OF VISIT: New consult for hyperthyroidism  REFERRING PROVIDER: Lucius Krabbe, NP  PCP: Jodie Lavern CROME, MD  HISTORY OF PRESENT ILLNESS:   [name] is a 53 y.o. old female with past medical history as listed below is presented for new consult for hyperthyroidism.   Pertinent Thyroid  History: Patient is referred to endocrinology for evaluation and management of hyperthyroidism.  Patient has symptoms of fatigue, mild low-grade fever associated with nasal congestion, earache, neck pain, sore throat from last Wednesday.  She was evaluated by primary care provider on August 25.  She had thyroid  function test on August 25 showed low TSH of 0.06 and elevated free T4 of 3.48 consistent with hypothyroidism along with normal thyroid  peroxidase and thyroglobulin antibody.  Patient has been taking Tylenol  or Motrin  or Advil  as needed for fever and pain.  She has not noticed significant improvement of the pain however mildly better compared to when it is started.  She has occasional palpitation, has noticed increased sweating during the night and also has heat intolerance.  Patient has been started on Augmentin  empirically.  She denies change in bowel habit with diarrhea or constipation.  She had normal thyroid  function test as of December 2024 with TSH of 1.90 and had normal thyroid  function test in the last few years.  She does not have history of thyroid  disorder.  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  No recent IV iodine contrast use.  No family history of thyroid  disorder.  No amiodarone use.  Labs :   Latest Reference Range & Units 05/12/24 14:45  TSH 0.35 - 5.50 uIU/mL 0.06 (L)  T4,Free(Direct)  0.60 - 1.60 ng/dL 6.51 (H)  Thyroglobulin Ab < or = 1 IU/mL 1  Thyroperoxidase Ab SerPl-aCnc <9 IU/mL 4  (L): Data is abnormally low (H): Data is abnormally high   Latest Reference Range & Units 02/26/19 10:55 07/14/21 10:35 09/03/23 14:30  TSH 0.35 - 5.50 uIU/mL 2.28 1.54 1.90     Latest Reference Range & Units 06/20/24 14:02  TSH mIU/L 9.29 (H)  Triiodothyronine,Free,Serum 2.3 - 4.2 pg/mL 3.2  T4,Free(Direct) 0.8 - 1.8 ng/dL 0.9  (H): Data is abnormally high  Overall feeling good, no new symptoms.  Plan to monitor.  REVIEW OF SYSTEMS:  As per history of present illness.   PAST MEDICAL HISTORY: Past Medical History:  Diagnosis Date   Anemia    as a child and during pregnancy   Anxiety    CIN I (cervical intraepithelial neoplasia I)    1999-LEEP, normal Paps after    Depression    Essential hypertension 05/08/2019   Patient follows with Dr. Lavern Jodie, PCP. ARNETTA 07/14/21 as of 02/28/22.   Family history of premature CAD 11/20/2018   Mom   GERD (gastroesophageal reflux disease)    H/O gestational diabetes mellitus, not currently pregnant    GESTATIONAL     Headache    hx of migraines, migraine w/parathesia on 04/04/2019, MRI normal   History of melanoma 07/14/2021   Face, 20s. Sees Dr. Ivin annually   Prolapse of anterior vaginal wall 2023   SUI (stress urinary incontinence, female) 2023   Tinnitus aurium, bilateral    since 2018, off and on   Wears glasses  reading only    PAST SURGICAL HISTORY: Past Surgical History:  Procedure Laterality Date   AUGMENTATION MAMMAPLASTY  2005   BLADDER SUSPENSION N/A 03/06/2022   Procedure: TRANSVAGINAL TAPE (TVT) PROCEDURE;  Surgeon: Marilynne Rosaline SAILOR, MD;  Location: [facility];  Service: Gynecology;  Laterality: N/A;   BUNIONECTOMY Left 1997   CERVICAL BIOPSY  W/ LOOP ELECTRODE EXCISION  1999   CIN I   COLONOSCOPY  11/26/2020   normal   CYSTOCELE REPAIR N/A 03/06/2022   Procedure: ANTERIOR REPAIR  (CYSTOCELE);  Surgeon: Marilynne Rosaline SAILOR, MD;  Location: [facility];  Service: Gynecology;  Laterality: N/A;   CYSTOSCOPY N/A 03/06/2022   Procedure: CYSTOSCOPY;  Surgeon: Marilynne Rosaline SAILOR, MD;  Location: [facility];  Service: Gynecology;  Laterality: N/A;   ear drum surgery     at age 42    ALLERGIES: No Known Allergies  FAMILY HISTORY:  Family History  Problem Relation Age of Onset   Diabetes Mother    Heart disease Mother    Hypertension Mother    Hyperlipidemia Mother    Lung cancer Mother    Cancer Father        RENAL CELL   Renal cancer Father    Heart disease Maternal Grandmother    Heart disease Paternal Grandmother    Hypertension Paternal Grandmother    Heart disease Paternal Grandfather    Prostate cancer Paternal Grandfather    Healthy Daughter    Epilepsy Son    Learning disabilities Maternal Aunt    Diabetes Brother    Cerebral aneurysm Maternal Grandfather    Colon cancer Neg Hx    Esophageal cancer Neg Hx    Stomach cancer Neg Hx    Rectal cancer Neg Hx     SOCIAL HISTORY: Social History   Socioeconomic History   Marital status: Divorced    Spouse name: has a boyfriend   Number of children: 2   Years of education: Not on file   Highest education level: Not on file  Occupational History   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   Has 2 children and boyfriend has 3 children   Social Drivers of Corporate investment banker Strain: Not on file  Food Insecurity: Not on file  Transportation Needs: Not on file  Physical Activity: Not on file  Stress: Not  on file  Social Connections: Not on file    MEDICATIONS:  Current Outpatient Medications  Medication Sig Dispense Refill   acetaminophen  (TYLENOL ) 500 MG tablet Take 1 tablet (500 mg total) by mouth every 6 (six) hours as needed (pain). 30 tablet 0   azelastine  (ASTELIN ) 0.1 % nasal spray Place 1 spray into both nostrils 2 (two) times daily. Use in each nostril as directed 30 mL 12   famotidine (PEPCID) 10 MG tablet Take 10 mg by mouth as needed for heartburn or indigestion. Pepcid AC chewable     fluticasone (FLONASE) 50 MCG/ACT nasal spray SMARTSIG:1 Spray(s) Both Nares Every 12 Hours  hydrochlorothiazide  (MICROZIDE ) 12.5 MG capsule TAKE 1 CAPSULE BY MOUTH EVERY DAY 30 capsule 11   ibuprofen  (ADVIL ) 600 MG tablet Take 1 tablet (600 mg total) by mouth every 6 (six) hours as needed. 30 tablet 0   metoprolol  succinate (TOPROL -XL) 100 MG 24 hr tablet TAKE 1 TABLET BY MOUTH EVERY DAY 30 tablet 11   Multiple Vitamins-Minerals (AIRBORNE GUMMIES PO) Take by mouth. 3 gummies daily     naproxen  (NAPROSYN ) 500 MG tablet Take 1 tablet (500 mg total) by mouth 2 (two) times daily with a meal. For pain & fever. 60 tablet 0   ondansetron  (ZOFRAN ) 4 MG tablet Take 1 tablet (4 mg total) by mouth every 8 (eight) hours as needed for nausea or vomiting. 20 tablet 0   predniSONE  (DELTASONE ) 20 MG tablet Take 1 tablet (20 mg total) by mouth daily with breakfast. 5 tablet 1   No current facility-administered medications for this visit.    PHYSICAL EXAM: Vitals:   06/26/24 1529  BP: 136/80  Pulse: 71  Resp: 20  SpO2: 98%  Weight: 152 lb (68.9 kg)  Height: 5' 2.5 (1.588 m)   Body mass index is 27.36 kg/m.  Wt Readings from Last 3 Encounters:  06/26/24 152 lb (68.9 kg)  06/10/24 149 lb 6.4 oz (67.8 kg)  05/15/24 147 lb 6.4 oz (66.9 kg)    General: Well developed, well nourished female in no apparent distress.  HEENT: AT/, no external lesions. Hearing intact to the spoken word Eyes: EOMI. No  stare, proptosis or lid lag. Conjunctiva clear and no icterus. No erythema or watering Neck: Trachea midline, neck supple with mild appreciable thyromegaly or no lymphadenopathy and no palpable thyroid  nodules. Tenderness + Lungs: Clear to auscultation, no wheeze. Respirations not labored Heart: S1S2, Regular in rate and rhythm.  Abdomen: Soft, non tender Neurologic: Alert, oriented, normal speech, deep tendon biceps reflexes 3+,  no gross focal neurological deficit Extremities: No pedal pitting edema, no tremors of outstretched hands Skin: Warm, color good.  Psychiatric: Does not appear depressed or anxious  PERTINENT HISTORIC LABORATORY AND IMAGING STUDIES:  All pertinent laboratory results were reviewed. Please see HPI also for further details.   TSH  Date Value Ref Range Status  06/20/2024 9.29 (H) mIU/L Final    Comment:              Reference Range .           > or = 20 Years  0.40-4.50 .                Pregnancy Ranges           First trimester    0.26-2.66           Second trimester   0.55-2.73           Third trimester    0.43-2.91   05/12/2024 0.06 (L) 0.35 - 5.50 uIU/mL Final  09/03/2023 1.90 0.35 - 5.50 uIU/mL Final    Lab Results  Component Value Date   FREET4 0.9 06/20/2024   FREET4 3.48 (H) 05/12/2024   T3FREE 3.2 06/20/2024   T3FREE 13.4 (H) 05/15/2024   TSH 9.29 (H) 06/20/2024   TSH 0.06 (L) 05/12/2024   TSH 1.90 09/03/2023    No results found for: THYROTRECAB  Lab Results  Component Value Date   TSH 9.29 (H) 06/20/2024   TSH 0.06 (L) 05/12/2024   TSH 1.90 09/03/2023   FREET4 0.9 06/20/2024   FREET4 3.48 (H) 05/12/2024  Lab Results  Component Value Date   TSI <89 05/15/2024     No components found for: TRAB     Latest Reference Range & Units 05/12/24 14:45 05/15/24 16:19  Thyroglobulin Ab < or = 1 IU/mL 1   Thyroperoxidase Ab SerPl-aCnc <9 IU/mL 4   Thyroid  stimulating immunoglobulin   Rpt  TSI <140 % baseline  <89  TRAB <=2.00  IU/L  <1.00  Rpt: View report in Results Review for more information  ASSESSMENT / PLAN  No diagnosis found.  Patient has hyperthyroidism.  She is also having symptoms of cold with low-grade fever, nasal congestion and sore throat.  This clinical context hyperthyroidism is most likely due to subacute thyroiditis.  This is a inflammatory condition of the thyroid  gland may have been triggered by viral infection.  Ultrasound thyroid  recently with normal solid thyroid  nodule, consistent with heterogenous thyroid  parenchyma/chronic inflammatory thyroid  disorder/thyroiditis.  If it is thyroiditis related hyperthyroidism she does not need antithyroid medication.  Treatment is mainly symptomatic.  Thyroiditis will improve over time within 6 to 8 weeks with normalization of thyroid  function test or some time it can progress to hypothyroidism.  I have provided ATA handout about thyroiditis.  Plan: - I would like to check free T3 and also like to check autoantibodies for Graves' disease TRAb and TSI. - She did not have optimal relief with NSAIDs and Tylenol  for neck pain.  I would like to start prednisone  20 mg daily for 5 days.  Patient is asked to take another 5 days if needed and then stop. - Patient is currently on beta-blocker metoprolol  and heart rate is 91. - She needs close monitoring of thyroid  function test. - Will follow-up in 6 weeks and check thyroid  function test at that time.   There are no diagnoses linked to this encounter.   DISPOSITION Follow up in clinic in 6 weeks suggested.  All questions answered and patient verbalized understanding of the plan.  Veronica Lido Maske, MD [facility] [address] [place], [address] [place], [place] [address] Phone # [phone]   At least part of this note was generated using voice recognition software. Inadvertent word errors may have occurred, which were not recognized during the proofreading process.

## 2024-08-04 ENCOUNTER — Other Ambulatory Visit

## 2024-08-04 LAB — TSH: TSH: 7.84 m[IU]/L — ABNORMAL HIGH

## 2024-08-04 LAB — T3, FREE: T3, Free: 3.4 pg/mL (ref 2.3–4.2)

## 2024-08-04 LAB — T4, FREE: Free T4: 0.9 ng/dL (ref 0.8–1.8)

## 2024-08-05 ENCOUNTER — Ambulatory Visit: Payer: Self-pay | Admitting: Endocrinology

## 2024-08-05 ENCOUNTER — Ambulatory Visit: Admitting: Family Medicine

## 2024-08-05 ENCOUNTER — Encounter: Payer: Self-pay | Admitting: Family Medicine

## 2024-08-05 VITALS — BP 124/84 | HR 69 | Temp 97.3°F | Ht 62.5 in | Wt 152.1 lb

## 2024-08-05 DIAGNOSIS — I10 Essential (primary) hypertension: Secondary | ICD-10-CM

## 2024-08-05 DIAGNOSIS — R2231 Localized swelling, mass and lump, right upper limb: Secondary | ICD-10-CM | POA: Diagnosis not present

## 2024-08-05 DIAGNOSIS — E069 Thyroiditis, unspecified: Secondary | ICD-10-CM | POA: Diagnosis not present

## 2024-08-05 MED ORDER — LISINOPRIL-HYDROCHLOROTHIAZIDE 10-12.5 MG PO TABS
1.0000 | ORAL_TABLET | Freq: Every day | ORAL | 3 refills | Status: AC
Start: 2024-08-05 — End: ?

## 2024-08-05 NOTE — Progress Notes (Signed)
 Subjective  CC:  Chief Complaint  Patient presents with   Arm Pain    Pt c/o swelling under right arm pit. Noticed 1 week ago.     HPI: [name] is a 53 y.o. female who presents to the office today to address the problems listed above in the chief complaint. Discussed the use of AI scribe software for clinical note transcription with the patient, who gave verbal consent to proceed.  History of Present Illness [name] [facility] is a 53 year old female who presents with a palpable mass under her right arm.  Right axillary mass, h/o breast implants - Palpable mass under the right arm first noticed approximately one week ago - Initially tender and described as 'pea-sized' - Uncertain if the mass has resolved or if it is difficult to locate currently - No prior history of palpable lymph nodes in this area - Possible irritation from bra considered as a contributing factor - Concern for association with a recent cold - No other systemic symptoms reported except for a runny nose  Breast implants - Concern about possible breast implant leakage as a cause of axillary symptoms - No recent evaluation of breast implants - Original surgeon is retired  Hypertension - Monitors blood pressure at home, typically in the 120s/80s range - Currently taking metoprolol  100 mg daily and hydrochlorothiazide  12.5 mg daily - Uncertain about details of a previous diuretic dosage adjustment - No current symptoms related to blood pressure  Thyroiditis: I reviewed recent office visits from endocrine and labs - transient hyperthyroidism due to thyroiditis w/ neg autoimmune work up. Treated with nsaids and prednisone . - most recent TSH elevated, monitoring for hypothyroidism. Pt has f/u. No meds yet.  Lab Results  Component Value Date   TSH 7.84 (H) 08/04/2024     Assessment  1. Axillary mass, right   2. Essential hypertension   3. Thyroiditis      Plan  Assessment and  Plan Assessment & Plan Right axillary mass Palpable right axillary mass, smooth and mobile, approximately piece-sized. Differential diagnosis includes lymph node or cyst. No tenderness reported. Breast implants present, but no recent evaluation. - Ordered right breast and axillary ultrasound to evaluate axillary mass  Hypertension Blood pressure readings at home typically in the 120s/80s. Current medication includes metoprolol  100 mg daily. Discussed options to adjust antihypertensive regimen to achieve target blood pressure of 120s/70s or less. Consideration of switching from hydrochlorothiazide  to lisinopril HCTZ to avoid potential hypokalemia associated with hydrochlorothiazide . - Ordered lisinopril HCTZ 10/12.5 mg daily to replace hydrochlorothiazide  25 mg - Continue metoprolol  100 mg daily - Continue home monitoring of blood pressure  Thyroiditis: monitoring with endocrine.     Follow up: as scheduled in dec for cpe Orders Placed This Encounter  Procedures   US  LIMITED ULTRASOUND INCLUDING AXILLA RIGHT BREAST   Meds ordered this encounter  Medications   lisinopril-hydrochlorothiazide  (ZESTORETIC) 10-12.5 MG tablet    Sig: Take 1 tablet by mouth daily.    Dispense:  90 tablet    Refill:  3     I reviewed the patients updated PMH, FH, and SocHx.  Patient Active Problem List   Diagnosis Date Noted   Thyroiditis 08/05/2024    Priority: High   History of melanoma 07/14/2021    Priority: High   Essential hypertension 05/08/2019    Priority: High   Family history of premature CAD 11/20/2018    Priority: High   Anxiety 06/27/2012    Priority:  High   Gastroesophageal reflux disease 06/18/2019    Priority: Medium    Carpal tunnel syndrome, bilateral 06/18/2019    Priority: Medium    Primary stress urinary incontinence 07/14/2021    Priority: Low   Allergic rhinitis 12/17/2012    Priority: Low   Current Meds  Medication Sig   acetaminophen  (TYLENOL ) 500 MG tablet Take 1  tablet (500 mg total) by mouth every 6 (six) hours as needed (pain).   azelastine  (ASTELIN ) 0.1 % nasal spray Place 1 spray into both nostrils 2 (two) times daily. Use in each nostril as directed   famotidine (PEPCID) 10 MG tablet Take 10 mg by mouth as needed for heartburn or indigestion. Pepcid AC chewable   fluticasone (FLONASE) 50 MCG/ACT nasal spray SMARTSIG:1 Spray(s) Both Nares Every 12 Hours   ibuprofen  (ADVIL ) 600 MG tablet Take 1 tablet (600 mg total) by mouth every 6 (six) hours as needed.   lisinopril-hydrochlorothiazide  (ZESTORETIC) 10-12.5 MG tablet Take 1 tablet by mouth daily.   metoprolol  succinate (TOPROL -XL) 100 MG 24 hr tablet TAKE 1 TABLET BY MOUTH EVERY DAY   Multiple Vitamins-Minerals (AIRBORNE GUMMIES PO) Take by mouth. 3 gummies daily   [DISCONTINUED] hydrochlorothiazide  (MICROZIDE ) 12.5 MG capsule TAKE 1 CAPSULE BY MOUTH EVERY DAY   [DISCONTINUED] predniSONE  (DELTASONE ) 20 MG tablet Take 1 tablet (20 mg total) by mouth daily with breakfast.   Allergies: Patient has no known allergies. Family History: Patient family history includes Cancer in her father; Cerebral aneurysm in her maternal grandfather; Diabetes in her brother and mother; Epilepsy in her son; Healthy in her daughter; Heart disease in her maternal grandmother, mother, paternal grandfather, and paternal grandmother; Hyperlipidemia in her mother; Hypertension in her mother and paternal grandmother; Learning disabilities in her maternal aunt; Lung cancer in her mother; Prostate cancer in her paternal grandfather; Renal cancer in her father. Social History:  Patient  reports that she has never smoked. She has never used smokeless tobacco. She reports that she does not currently use alcohol. She reports that she does not use drugs.  Review of Systems: Constitutional: Negative for fever malaise or anorexia Cardiovascular: negative for chest pain Respiratory: negative for SOB or persistent cough Gastrointestinal:  negative for abdominal pain  Objective  Vitals: BP 124/84 Comment: by home reading  Pulse 69   Temp (!) 97.3 F (36.3 C) (Temporal)   Ht 5' 2.5 (1.588 m)   Wt 152 lb 2 oz (69 kg)   SpO2 98%   BMI 27.38 kg/m  General: no acute distress , A&Ox3 Breast and axillary exam: right axilla with smooth ovoid nontender approx 2x3cm mass, mobile, no other masses noted. Breast implants present. No nipple discharge or skin changes. Left breast and axilla are normal  Commons side effects, risks, benefits, and alternatives for medications and treatment plan prescribed today were discussed, and the patient expressed understanding of the given instructions. Patient is instructed to call or message via MyChart if he/she has any questions or concerns regarding our treatment plan. No barriers to understanding were identified. We discussed Red Flag symptoms and signs in detail. Patient expressed understanding regarding what to do in case of urgent or emergency type symptoms.  Medication list was reconciled, printed and provided to the patient in AVS. Patient instructions and summary information was reviewed with the patient as documented in the AVS. This note was prepared with assistance of Dragon voice recognition software. Occasional wrong-word or sound-a-like substitutions may have occurred due to the inherent limitations of voice recognition  software

## 2024-08-05 NOTE — Patient Instructions (Signed)
 Please follow up as scheduled for your next visit with me: 09/03/2024   If you have any questions or concerns, please don't hesitate to send me a message via MyChart or call the office at [phone]. Thank you for visiting with us  today! It's our pleasure caring for you.   Please call the office checked below to schedule your appointment for your breast ultarsound and/or bone density screen (the checked studies were ordered): [x]   Breast ultrasound  []   Bone Density  []   [facility]     [address] [place], [place]        [phone]         [x]   [facility]  [address] [place], [place]  [phone]   I have ordered lisinopril hydrochlorothiazide  to start in addition to your metoprolol  for your blood pressure. Please stop the plain hydrochlorothiazide 

## 2024-08-06 ENCOUNTER — Encounter: Payer: Self-pay | Admitting: Family Medicine

## 2024-08-07 ENCOUNTER — Encounter: Payer: Self-pay | Admitting: Endocrinology

## 2024-08-07 ENCOUNTER — Ambulatory Visit: Admitting: Endocrinology

## 2024-08-07 VITALS — BP 132/82 | HR 68 | Resp 16 | Ht 62.5 in | Wt 152.6 lb

## 2024-08-07 DIAGNOSIS — E039 Hypothyroidism, unspecified: Secondary | ICD-10-CM | POA: Diagnosis not present

## 2024-08-07 MED ORDER — LEVOTHYROXINE SODIUM 25 MCG PO TABS
25.0000 ug | ORAL_TABLET | Freq: Every day | ORAL | 3 refills | Status: AC
Start: 2024-08-07 — End: ?

## 2024-08-07 NOTE — Progress Notes (Signed)
 Outpatient Endocrinology Note Sandeep Delagarza, MD   Patient's Name: [name]    DOB: [birth year]    MRN: [id number]  REASON OF VISIT: Follow-up for hyperthyroidism /thyroiditis  REFERRING PROVIDER: Lucius Krabbe, NP  PCP: Jodie Lavern CROME, MD  HISTORY OF PRESENT ILLNESS:   [name] is a 53 y.o. old female with past medical history as listed below is presented for follow-up for hyperthyroidism /thyroiditis.   Pertinent Thyroid  History: Patient was referred to endocrinology for evaluation and management of hyperthyroidism.  Initial consult on May 15, 2024.  Patient had symptoms suggestive of hyperthyroidism with thyroid  hormone levels elevated with low TSH and elevated free T4.  During that time patient had a progressive acquired infection and also treated with antibiotic.  Patient was diagnosis of subacute thyroiditis.  She was treated with NSAIDs and prednisone .  Patient had normal antibodies for thyroid  peroxidase antibody, thyroglobulin antibody, TRAb and TSI negative for Hashimoto thyroiditis and Graves' disease.  She had normal thyroid  function test as of December 2024 with TSH of 1.90 and had normal thyroid  function test in the last few years.  She does not have history of thyroid  disorder.  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  No recent IV iodine contrast use.  No family history of thyroid  disorder.  No amiodarone use.  In October 2025 patient had elevated TSH of 9.29 with normal free T4 and free T3, thyroid  hormone replacement was not started we will plan to monitor.   Interval history: Patient has complaints of fatigue, muscle and joint pain.  Denies change in bowel habit or constipation.  She also has cold intolerance.  Recent thyroid  function test still with elevated TSH of 7.84 and normal free T4 and  free T3.  She is currently not on thyroid  medication.   Latest Reference Range & Units 08/04/24 13:59  TSH mIU/L 7.84 (H)  Triiodothyronine,Free,Serum 2.3 - 4.2 pg/mL 3.4  T4,Free(Direct) 0.8 - 1.8 ng/dL 0.9  (H): Data is abnormally high  REVIEW OF SYSTEMS:  As per history of present illness.   PAST MEDICAL HISTORY: Past Medical History:  Diagnosis Date   Anemia    as a child and during pregnancy   Anxiety    CIN I (cervical intraepithelial neoplasia I)    1999-LEEP, normal Paps after    Depression    Essential hypertension 05/08/2019   Patient follows with Dr. Lavern Jodie, PCP. ARNETTA 07/14/21 as of 02/28/22.   Family history of premature CAD 11/20/2018   Mom   GERD (gastroesophageal reflux disease)    H/O gestational diabetes mellitus, not currently pregnant    GESTATIONAL     Headache    hx of migraines, migraine w/parathesia on 04/04/2019, MRI normal   History of melanoma 07/14/2021   Face, 20s. Sees Dr. Ivin annually   Prolapse of anterior vaginal wall 2023   SUI (stress urinary incontinence, female) 2023   Tinnitus aurium, bilateral    since 2018, off and on   Wears glasses    reading only    PAST SURGICAL HISTORY: Past Surgical History:  Procedure Laterality Date   AUGMENTATION MAMMAPLASTY  2005   BLADDER SUSPENSION N/A 03/06/2022   Procedure: TRANSVAGINAL TAPE (TVT) PROCEDURE;  Surgeon: Marilynne Rosaline SAILOR, MD;  Location: [facility];  Service: Gynecology;  Laterality: N/A;   BUNIONECTOMY Left 1997   CERVICAL BIOPSY  W/  LOOP ELECTRODE EXCISION  1999   CIN I   COLONOSCOPY  11/26/2020   normal   CYSTOCELE REPAIR N/A 03/06/2022   Procedure: ANTERIOR REPAIR (CYSTOCELE);  Surgeon: Marilynne Rosaline SAILOR, MD;  Location: [facility];  Service: Gynecology;  Laterality: N/A;   CYSTOSCOPY N/A 03/06/2022   Procedure: CYSTOSCOPY;  Surgeon: Marilynne Rosaline SAILOR, MD;  Location: [facility];  Service: Gynecology;  Laterality:  N/A;   ear drum surgery     at age 50    ALLERGIES: No Known Allergies  FAMILY HISTORY:  Family History  Problem Relation Age of Onset   Diabetes Mother    Heart disease Mother    Hypertension Mother    Hyperlipidemia Mother    Lung cancer Mother    Cancer Father        RENAL CELL   Renal cancer Father    Heart disease Maternal Grandmother    Heart disease Paternal Grandmother    Hypertension Paternal Grandmother    Heart disease Paternal Grandfather    Prostate cancer Paternal Grandfather    Healthy Daughter    Epilepsy Son    Learning disabilities Maternal Aunt    Diabetes Brother    Cerebral aneurysm Maternal Grandfather    Colon cancer Neg Hx    Esophageal cancer Neg Hx    Stomach cancer Neg Hx    Rectal cancer Neg Hx     SOCIAL HISTORY: Social History   Socioeconomic History   Marital status: Divorced    Spouse name: has a boyfriend   Number of children: 2   Years of education: Not on file   Highest education level: Not on file  Occupational History   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   Has 2 children and boyfriend has 3 children   Social Drivers of Corporate Investment Banker Strain: Not on file  Food Insecurity: Not on file  Transportation Needs: Not on file  Physical Activity: Not on file  Stress: Not on file  Social Connections: Not on file    MEDICATIONS:  Current Outpatient Medications  Medication Sig Dispense Refill   acetaminophen  (TYLENOL ) 500 MG tablet Take 1 tablet (500 mg total) by mouth every 6 (six) hours as needed (pain). 30 tablet 0   azelastine  (ASTELIN ) 0.1 % nasal  spray Place 1 spray into both nostrils 2 (two) times daily. Use in each nostril as directed 30 mL 12   famotidine (PEPCID) 10 MG tablet Take 10 mg by mouth as needed for heartburn or indigestion. Pepcid AC chewable     fluticasone (FLONASE) 50 MCG/ACT nasal spray SMARTSIG:1 Spray(s) Both Nares Every 12 Hours     ibuprofen  (ADVIL ) 600 MG tablet Take 1 tablet (600 mg total) by mouth every 6 (six) hours as needed. 30 tablet 0   levothyroxine  (SYNTHROID ) 25 MCG tablet Take 1 tablet (25 mcg total) by mouth daily. 90 tablet 3   lisinopril -hydrochlorothiazide  (ZESTORETIC ) 10-12.5 MG tablet Take 1 tablet by mouth daily. 90 tablet 3   metoprolol  succinate (  TOPROL -XL) 100 MG 24 hr tablet TAKE 1 TABLET BY MOUTH EVERY DAY 30 tablet 11   Multiple Vitamins-Minerals (AIRBORNE GUMMIES PO) Take by mouth. 3 gummies daily     No current facility-administered medications for this visit.    PHYSICAL EXAM: Vitals:   08/07/24 1615  BP: 132/82  Pulse: 68  Resp: 16  SpO2: 99%  Weight: 152 lb 9.6 oz (69.2 kg)  Height: 5' 2.5 (1.588 m)   Body mass index is 27.47 kg/m.  Wt Readings from Last 3 Encounters:  08/07/24 152 lb 9.6 oz (69.2 kg)  08/05/24 152 lb 2 oz (69 kg)  06/26/24 152 lb (68.9 kg)    General: Well developed, well nourished female in no apparent distress.  HEENT: AT/[place], no external lesions. Hearing intact to the spoken word Eyes: EOMI. No stare, proptosis or lid lag. Conjunctiva clear and no icterus. No erythema or watering Neck: Trachea midline, neck supple with mild appreciable thyromegaly or no lymphadenopathy and no palpable thyroid  nodules. Lungs: Clear to auscultation, no wheeze. Respirations not labored Heart: S1S2, Regular in rate and rhythm.  Abdomen: Soft, non tender Neurologic: Alert, oriented, normal speech, deep tendon biceps reflexes 2+,  no gross focal neurological deficit Extremities: No pedal pitting edema, no tremors of outstretched hands Skin: Warm, color good.  Dry  skin. Psychiatric: Does not appear depressed or anxious  PERTINENT HISTORIC LABORATORY AND IMAGING STUDIES:  All pertinent laboratory results were reviewed. Please see HPI also for further details.   TSH  Date Value Ref Range Status  08/04/2024 7.84 (H) mIU/L Final    Comment:              Reference Range .           > or = 20 Years  0.40-4.50 .                Pregnancy Ranges           First trimester    0.26-2.66           Second trimester   0.55-2.73           Third trimester    0.43-2.91   06/20/2024 9.29 (H) mIU/L Final    Comment:              Reference Range .           > or = 20 Years  0.40-4.50 .                Pregnancy Ranges           First trimester    0.26-2.66           Second trimester   0.55-2.73           Third trimester    0.43-2.91   05/12/2024 0.06 (L) 0.35 - 5.50 uIU/mL Final    Lab Results  Component Value Date   FREET4 0.9 08/04/2024   FREET4 0.9 06/20/2024   FREET4 3.48 (H) 05/12/2024   T3FREE 3.4 08/04/2024   T3FREE 3.2 06/20/2024   T3FREE 13.4 (H) 05/15/2024   TSH 7.84 (H) 08/04/2024   TSH 9.29 (H) 06/20/2024   TSH 0.06 (L) 05/12/2024    No results found for: THYROTRECAB  Lab Results  Component Value Date   TSH 7.84 (H) 08/04/2024   TSH 9.29 (H) 06/20/2024   TSH 0.06 (L) 05/12/2024   FREET4 0.9 08/04/2024   FREET4 0.9 06/20/2024   FREET4 3.48 (H) 05/12/2024  Lab Results  Component Value Date   TSI <89 05/15/2024     No components found for: TRAB     Latest Reference Range & Units 05/12/24 14:45 05/15/24 16:19  Thyroglobulin Ab < or = 1 IU/mL 1   Thyroperoxidase Ab SerPl-aCnc <9 IU/mL 4   Thyroid  stimulating immunoglobulin   Rpt  TSI <140 % baseline  <89  TRAB <=2.00 IU/L  <1.00  Rpt: View report in Results Review for more information    Latest Reference Range & Units 05/12/24 14:45  TSH 0.35 - 5.50 uIU/mL 0.06 (L)  T4,Free(Direct) 0.60 - 1.60 ng/dL 6.51 (H)  Thyroglobulin Ab < or = 1 IU/mL 1  Thyroperoxidase  Ab SerPl-aCnc <9 IU/mL 4  (L): Data is abnormally low (H): Data is abnormally high   Latest Reference Range & Units 02/26/19 10:55 07/14/21 10:35 09/03/23 14:30  TSH 0.35 - 5.50 uIU/mL 2.28 1.54 1.90    ASSESSMENT / PLAN  1. Primary hypothyroidism     Patient had transient hyperthyroidism in August, overall consistent with having subacute thyroiditis causing hyperthyroidism.  She had normal thyroid  autoantibodies for TRAb, TSI, thyroglobulin antibody and thyroid  peroxidase antibody.  Patient was treated with NSAIDs and prednisone .  Patient progressed to hypothyroidism.  She continued to have elevated TSH with persistent hypothyroidism.  Plan: -Start levothyroxine 25 mcg daily. -Discussed proper intake. -Check thyroid  function test prior to follow-up visit in 3 months.  Diagnoses and all orders for this visit:  Primary hypothyroidism -     levothyroxine (SYNTHROID) 25 MCG tablet; Take 1 tablet (25 mcg total) by mouth daily. -     T4, free -     TSH     DISPOSITION Follow up in clinic in 3 months suggested.  Labs prior to follow-up visit as ordered.  All questions answered and patient verbalized understanding of the plan.  Aijah Lattner, MD [facility] Endocrinology [facility] [address] [place], [address] [place], [place] [address] Phone # [phone]   At least part of this note was generated using voice recognition software. Inadvertent word errors may have occurred, which were not recognized during the proofreading process.

## 2024-08-18 ENCOUNTER — Encounter: Payer: Self-pay | Admitting: Family Medicine

## 2024-09-03 ENCOUNTER — Encounter: Payer: Self-pay | Admitting: Family Medicine

## 2024-09-03 ENCOUNTER — Ambulatory Visit: Payer: 59 | Admitting: Family Medicine

## 2024-09-03 VITALS — BP 109/60 | HR 70 | Temp 97.7°F | Ht 62.5 in | Wt 151.8 lb

## 2024-09-03 DIAGNOSIS — Z8249 Family history of ischemic heart disease and other diseases of the circulatory system: Secondary | ICD-10-CM

## 2024-09-03 DIAGNOSIS — E069 Thyroiditis, unspecified: Secondary | ICD-10-CM

## 2024-09-03 DIAGNOSIS — Z0001 Encounter for general adult medical examination with abnormal findings: Secondary | ICD-10-CM | POA: Diagnosis not present

## 2024-09-03 DIAGNOSIS — I10 Essential (primary) hypertension: Secondary | ICD-10-CM | POA: Diagnosis not present

## 2024-09-03 DIAGNOSIS — N951 Menopausal and female climacteric states: Secondary | ICD-10-CM | POA: Diagnosis not present

## 2024-09-03 LAB — LIPID PANEL
Cholesterol: 155 mg/dL (ref 28–200)
HDL: 47.7 mg/dL (ref >39.00)
LDL Cholesterol: 95 mg/dL (ref 10–99)
NonHDL: 107.63
Total CHOL/HDL Ratio: 3
Triglycerides: 61 mg/dL (ref 10.0–149.0)
VLDL: 12.2 mg/dL (ref 0.0–40.0)

## 2024-09-03 LAB — CBC WITH DIFFERENTIAL/PLATELET
Basophils Absolute: 0 K/uL (ref 0.0–0.1)
Basophils Relative: 0.5 % (ref 0.0–3.0)
Eosinophils Absolute: 0.3 K/uL (ref 0.0–0.7)
Eosinophils Relative: 5.8 % — ABNORMAL HIGH (ref 0.0–5.0)
HCT: 38.8 % (ref 36.0–46.0)
Hemoglobin: 13.1 g/dL (ref 12.0–15.0)
Lymphocytes Relative: 40.3 % (ref 12.0–46.0)
Lymphs Abs: 2.1 K/uL (ref 0.7–4.0)
MCHC: 33.8 g/dL (ref 30.0–36.0)
MCV: 90.6 fl (ref 78.0–100.0)
Monocytes Absolute: 0.5 K/uL (ref 0.1–1.0)
Monocytes Relative: 9.3 % (ref 3.0–12.0)
Neutro Abs: 2.3 K/uL (ref 1.4–7.7)
Neutrophils Relative %: 44.1 % (ref 43.0–77.0)
Platelets: 266 K/uL (ref 150.0–400.0)
RBC: 4.28 Mil/uL (ref 3.87–5.11)
RDW: 14.8 % (ref 11.5–15.5)
WBC: 5.2 K/uL (ref 4.0–10.5)

## 2024-09-03 LAB — COMPREHENSIVE METABOLIC PANEL WITH GFR
ALT: 21 U/L (ref 3–35)
AST: 21 U/L (ref 5–37)
Albumin: 4.4 g/dL (ref 3.5–5.2)
Alkaline Phosphatase: 59 U/L (ref 39–117)
BUN: 17 mg/dL (ref 6–23)
CO2: 27 meq/L (ref 19–32)
Calcium: 9.4 mg/dL (ref 8.4–10.5)
Chloride: 100 meq/L (ref 96–112)
Creatinine, Ser: 0.74 mg/dL (ref 0.40–1.20)
GFR: 91.37 mL/min (ref >60.00)
Glucose, Bld: 97 mg/dL (ref 70–99)
Potassium: 3.8 meq/L (ref 3.5–5.1)
Sodium: 135 meq/L (ref 135–145)
Total Bilirubin: 0.9 mg/dL (ref 0.2–1.2)
Total Protein: 7 g/dL (ref 6.0–8.3)

## 2024-09-03 NOTE — Patient Instructions (Signed)
 Please return in 12 months for your annual complete physical; please come fasting.   I will release your lab results to you on your MyChart account with further instructions. You may see the results before I do, but when I review them I will send you a message with my report or have my assistant call you if things need to be discussed. Please reply to my message with any questions. Thank you!   If you have any questions or concerns, please don't hesitate to send me a message via MyChart or call the office at [phone]. Thank you for visiting with us  today! It's our pleasure caring for you.

## 2024-09-03 NOTE — Progress Notes (Signed)
 Subjective  Chief Complaint  Patient presents with   Annual Exam   Hypertension    Pt here for Annual Exam and is currently fasting     HPI: [name] is a 53 y.o. female who presents to [facility] [place] today for a Female Wellness Visit. She also has the concerns and/or needs as listed above in the chief complaint. These will be addressed in addition to the Health Maintenance Visit.   Wellness Visit: annual visit with health maintenance review and exam  HM: screens are all current. Defers prevnar and shingrix due to upcoming holiday but will return to get them. Gyn exam up to date.   Chronic disease f/u and/or acute problem visit: (deemed necessary to be done in addition to the wellness visit): Discussed the use of AI scribe software for clinical note transcription with the patient, who gave verbal consent to proceed.  History of Present Illness [name] [facility] is a 53 year old female with hypertension and thyroid  dysfunction who presents for follow-up of her blood pressure and thyroid  management.  Hypertension - Blood pressure readings at home have been stable and within normal range since starting current antihypertensive medication. - Elevated blood pressure readings occur during medical visits, attributed to 'white coat hypertension'. - Regularly monitors blood pressure at various times throughout the day. - I reviewed home readings: all look great: avg 110/70 - we checked her cuff today here in office against ours and reads high as well confirming accuracy of her cuff - no cp sob or palpitations - on metoprolol  and lisinorpil Nonfasting for labs today  Acute thyroiditis now mildly hypothyroid - reviewed endo's notes and labs - Started thyroid  medication two weeks ago. On levl 25mcg daily - General improvement in symptoms with reduction in body aches. - Persistent fatigue on some days. - No adjustments to thyroid  medication to date. -  Plans to recheck thyroid  levels in six to eight weeks.  perimenopausal - Experienced unexpected menstrual period after nearly a year of amenorrhea. - Initial spotting last week progressed to a full period lasting from Friday to Tuesday. - no other significant sxs    Assessment  1. Encounter for well adult exam with abnormal findings   2. Family history of premature CAD   3. Thyroiditis   4. White coat syndrome with diagnosis of hypertension   5. Perimenopause      Plan  Female Wellness Visit: Age appropriate Health Maintenance and Prevention measures were discussed with patient. Included topics are cancer screening recommendations, ways to keep healthy (see AVS) including dietary and exercise recommendations, regular eye and dental care, use of seat belts, and avoidance of moderate alcohol use and tobacco use.  BMI: discussed patient's BMI and encouraged positive lifestyle modifications to help get to or maintain a target BMI. HM needs and immunizations were addressed and ordered. See below for orders. See HM and immunization section for updates. Return for shingrix and prevnar Routine labs and screening tests ordered including cmp, cbc and lipids where appropriate. Discussed recommendations regarding Vit D and calcium supplementation (see AVS)  Chronic disease management visit and/or acute problem visit: Assessment and Plan Assessment & Plan Essential hypertension with white coat response Blood pressure is well-controlled at home with readings consistently below 80 mmHg. Elevated readings in the office suggest white coat hypertension. No current symptoms of hypotension reported. - Continue current antihypertensive medication regimen. - Monitor blood pressure at home regularly. - Adjust medication if symptoms  of hypotension occur. - Check renal function and electrolytes. - screen for hyperlipidemia  Thyroiditis Thyroid  function appears to be stabilizing with current medication.  Reports occasional fatigue but no body aches. Thyroid  medication was initiated two weeks ago, and thyroid  levels will be re-evaluated in 6-8 weeks. - Continue current thyroid  medication. - Will re-evaluate thyroid  function in 6-8 weeks.  Perimenopause Experienced a full period after almost a year without menstruation, starting with spotting and progressing to a full period lasting several days. This may be related to thyroid  dysfunction or hormonal fluctuations. Discussed the possibility of hormone replacement therapy (HRT) as a future consideration. - discussed benefits of hrt and risks. Disucssed sxs. Will monitor.  - Will consider hormone replacement therapy in the future.  General Health Maintenance Discussed vaccinations including shingles and pneumonia. She is hesitant about the shingles vaccine due to a friend's adverse reaction but plans to receive it in the summer. Pneumonia vaccine is considered. - Recommended shingles and pneumonia vaccines. - Encouraged regular blood pressure monitoring.    Follow up: 12 mo for cpe and f/u  Orders Placed This Encounter  Procedures   CBC with Differential/Platelet   Comprehensive metabolic panel with GFR   Lipid panel   No orders of the defined types were placed in this encounter.     Body mass index is 27.32 kg/m. Wt Readings from Last 3 Encounters:  09/03/24 151 lb 12.8 oz (68.9 kg)  08/07/24 152 lb 9.6 oz (69.2 kg)  08/05/24 152 lb 2 oz (69 kg)     Patient Active Problem List   Diagnosis Date Noted Date Diagnosed   Thyroiditis 08/05/2024     Priority: High    2025: referred to endocrinology, Dr. Sudan Thupa transient hyperthyroidism in August, overall consistent with having subacute thyroiditis causing hyperthyroidism.  She had normal thyroid  autoantibodies for TRAb, TSI, thyroglobulin antibody and thyroid  peroxidase antibody.  Patient was treated with NSAIDs and prednisone .   Recent thyroid  function test with elevated TSH and  normal free T4 and free T3.   Discussed that subacute thyroiditis can have transient hypothyroidism phase, which can sometimes normalized to euthyroid or sometimes progress to hypothyroidism.    History of melanoma 07/14/2021     Priority: High    Face, 20s. Sees Dr. Ivin annually    White coat syndrome with diagnosis of hypertension 05/08/2019     Priority: High    Lisinopril  hct and metoprolol     Family history of premature CAD 11/20/2018     Priority: High    Mom    Anxiety 06/27/2012     Priority: High    Rare xanax  use    Gastroesophageal reflux disease 06/18/2019     Priority: Medium     GI 06/2019    Carpal tunnel syndrome, bilateral 06/18/2019     Priority: Medium    Primary stress urinary incontinence 07/14/2021     Priority: Low   Allergic rhinitis 12/17/2012     Priority: Low   Health Maintenance  Topic Date Due   Mammogram  10/23/2024   Zoster Vaccines- Shingrix (1 of 2) 09/05/2024 (Originally 09/17/2019)   Influenza Vaccine  12/16/2024 (Originally 04/18/2024)   Pneumococcal Vaccine: 50+ Years (1 of 1 - PCV) 05/08/2025 (Originally 09/17/2019)   Hepatitis B Vaccines 19-59 Average Risk (1 of 3 - 19+ 3-dose series) 05/08/2025 (Originally 09/16/1988)   DTaP/Tdap/Td (2 - Td or Tdap) 09/05/2027   Cervical Cancer Screening (HPV/Pap Cotest)  03/18/2029   Colonoscopy  11/27/2030  Hepatitis C Screening  Completed   HIV Screening  Completed   HPV VACCINES  Aged Out   Meningococcal B Vaccine  Aged Out   COVID-19 Vaccine  Discontinued   Immunization History  Administered Date(s) Administered   Influenza,trivalent, recombinat, inj, PF 06/19/2011, 06/10/2013   Tdap 09/04/2017   We updated and reviewed the patient's past history in detail and it is documented below. Allergies: Patient has no known allergies. Past Medical History Patient  has a past medical history of Anemia, Anxiety, CIN I (cervical intraepithelial neoplasia I), Depression, Essential hypertension  (05/08/2019), Family history of premature CAD (11/20/2018), GERD (gastroesophageal reflux disease), H/O gestational diabetes mellitus, not currently pregnant, Headache, History of melanoma (07/14/2021), Prolapse of anterior vaginal wall (2023), SUI (stress urinary incontinence, female) (2023), Tinnitus aurium, bilateral, and Wears glasses. Past Surgical History Patient  has a past surgical history that includes Cervical biopsy w/ loop electrode excision (1999); Augmentation mammaplasty (2005); Bunionectomy (Left, 1997); ear drum surgery; Colonoscopy (11/26/2020); Cystocele repair (N/A, 03/06/2022); Bladder suspension (N/A, 03/06/2022); and Cystoscopy (N/A, 03/06/2022). Family History: Patient family history includes Cancer in her father; Cerebral aneurysm in her maternal grandfather; Diabetes in her brother and mother; Epilepsy in her son; Healthy in her daughter; Heart disease in her maternal grandmother, mother, paternal grandfather, and paternal grandmother; Hyperlipidemia in her mother; Hypertension in her mother and paternal grandmother; Learning disabilities in her maternal aunt; Lung cancer in her mother; Prostate cancer in her paternal grandfather; Renal cancer in her father. Social History:  Patient  reports that she has never smoked. She has never used smokeless tobacco. She reports that she does not currently use alcohol. She reports that she does not use drugs.  Review of Systems: Constitutional: negative for fever or malaise Ophthalmic: negative for photophobia, double vision or loss of vision Cardiovascular: negative for chest pain, dyspnea on exertion, or new LE swelling Respiratory: negative for SOB or persistent cough Gastrointestinal: negative for abdominal pain, change in bowel habits or melena Genitourinary: negative for dysuria or gross hematuria, no abnormal uterine bleeding or disharge Musculoskeletal: negative for new gait disturbance or muscular weakness Integumentary: negative  for new or persistent rashes, no breast lumps Neurological: negative for TIA or stroke symptoms Psychiatric: negative for SI or delusions Allergic/Immunologic: negative for hives  Patient Care Team    Relationship Specialty Notifications Start End  Jodie Lavern CROME, MD PCP - General Family Medicine  11/20/18   Mammography, [facility]  Diagnostic Radiology  11/20/18   Ivin Kocher, MD Referring Physician Dermatology  07/14/21   Marilynne Rosaline SAILOR, MD Consulting Physician Gynecology  07/14/21    Comment: Urogynecology  Thapa, Sudan, MD Consulting Physician Endocrinology  05/15/24     Objective  Vitals: BP 109/60 Comment: by consistent home readings and checked against her home machine in office  Pulse 70   Temp 97.7 F (36.5 C)   Ht 5' 2.5 (1.588 m)   Wt 151 lb 12.8 oz (68.9 kg)   SpO2 99%   BMI 27.32 kg/m  General:  Well developed, well nourished, no acute distress  Psych:  Alert and orientedx3,normal mood and affect HEENT:  Normocephalic, atraumatic, non-icteric sclera,  supple neck without adenopathy, mass or thyromegaly Cardiovascular:  Normal S1, S2, RRR without gallop, rub or murmur Respiratory:  Good breath sounds bilaterally, CTAB with normal respiratory effort Gastrointestinal: normal bowel sounds, soft, non-tender, no noted masses. No HSM MSK: extremities without edema, joints without erythema or swelling Neurologic:    Mental status is normal.  Gross  motor and sensory exams are normal.  No tremor  Commons side effects, risks, benefits, and alternatives for medications and treatment plan prescribed today were discussed, and the patient expressed understanding of the given instructions. Patient is instructed to call or message via MyChart if he/she has any questions or concerns regarding our treatment plan. No barriers to understanding were identified. We discussed Red Flag symptoms and signs in detail. Patient expressed understanding regarding what to do in case of urgent or  emergency type symptoms.  Medication list was reconciled, printed and provided to the patient in AVS. Patient instructions and summary information was reviewed with the patient as documented in the AVS. This note was prepared with assistance of Dragon voice recognition software. Occasional wrong-word or sound-a-like substitutions may have occurred due to the inherent limitations of voice recognition software

## 2024-09-04 ENCOUNTER — Ambulatory Visit: Payer: Self-pay | Admitting: Family Medicine

## 2024-09-04 NOTE — Progress Notes (Signed)
 Labs reviewed.  The 10-year ASCVD risk score (Arnett DK, et al., 2019) is: 1.5%   Values used to calculate the score:     Age: 53 years     Clinically relevant sex: Female     Is Non-Hispanic African American: No     Diabetic: No     Tobacco smoker: No     Systolic Blood Pressure: 109 mmHg     Is BP treated: Yes     HDL Cholesterol: 47.7 mg/dL     Total Cholesterol: 155 mg/dL

## 2024-11-10 ENCOUNTER — Other Ambulatory Visit

## 2024-11-12 ENCOUNTER — Ambulatory Visit: Admitting: Endocrinology

## 2025-09-07 ENCOUNTER — Encounter: Admitting: Family Medicine
